# Patient Record
Sex: Male | Born: 1950 | Race: Black or African American | Hispanic: No | Marital: Married | Smoking: Never smoker
Health system: Southern US, Community
[De-identification: ages and names within clinical notes are randomized; demographics above are authoritative.]

## PROBLEM LIST (undated history)

## (undated) DIAGNOSIS — I483 Typical atrial flutter: Secondary | ICD-10-CM

## (undated) DIAGNOSIS — I509 Heart failure, unspecified: Secondary | ICD-10-CM

## (undated) DIAGNOSIS — N200 Calculus of kidney: Secondary | ICD-10-CM

## (undated) DIAGNOSIS — G4730 Sleep apnea, unspecified: Secondary | ICD-10-CM

## (undated) DIAGNOSIS — M1710 Unilateral primary osteoarthritis, unspecified knee: Secondary | ICD-10-CM

## (undated) DIAGNOSIS — K219 Gastro-esophageal reflux disease without esophagitis: Secondary | ICD-10-CM

## (undated) DIAGNOSIS — J40 Bronchitis, not specified as acute or chronic: Secondary | ICD-10-CM

## (undated) DIAGNOSIS — I119 Hypertensive heart disease without heart failure: Secondary | ICD-10-CM

## (undated) HISTORY — DX: Unilateral primary osteoarthritis, unspecified knee: M17.10

## (undated) HISTORY — DX: Hypertensive heart disease without heart failure: I11.9

## (undated) HISTORY — DX: Typical atrial flutter: I48.3

## (undated) HISTORY — PX: OTHER SURGICAL HISTORY: SHX169

## (undated) HISTORY — DX: Heart failure, unspecified: I50.9

---

## 1998-02-05 ENCOUNTER — Emergency Department (HOSPITAL_COMMUNITY): Admission: EM | Admit: 1998-02-05 | Discharge: 1998-02-05 | Payer: Self-pay | Admitting: Emergency Medicine

## 1998-02-11 ENCOUNTER — Ambulatory Visit (HOSPITAL_COMMUNITY): Admission: RE | Admit: 1998-02-11 | Discharge: 1998-02-11 | Payer: Self-pay | Admitting: Urology

## 1998-02-11 ENCOUNTER — Encounter: Payer: Self-pay | Admitting: Urology

## 2000-03-10 ENCOUNTER — Encounter: Admission: RE | Admit: 2000-03-10 | Discharge: 2000-03-10 | Payer: Self-pay | Admitting: Internal Medicine

## 2000-03-10 ENCOUNTER — Encounter: Payer: Self-pay | Admitting: Internal Medicine

## 2005-07-06 ENCOUNTER — Encounter: Payer: Self-pay | Admitting: Emergency Medicine

## 2012-02-13 ENCOUNTER — Other Ambulatory Visit (HOSPITAL_COMMUNITY): Payer: Self-pay | Admitting: Internal Medicine

## 2012-02-13 DIAGNOSIS — R062 Wheezing: Secondary | ICD-10-CM

## 2012-02-17 ENCOUNTER — Ambulatory Visit (HOSPITAL_COMMUNITY)
Admission: RE | Admit: 2012-02-17 | Discharge: 2012-02-17 | Disposition: A | Discharge status: Home / Self Care | Payer: BC Managed Care – PPO | Source: Ambulatory Visit | Attending: Internal Medicine | Admitting: Internal Medicine

## 2012-02-17 DIAGNOSIS — R062 Wheezing: Secondary | ICD-10-CM | POA: Insufficient documentation

## 2012-02-17 MED ORDER — ALBUTEROL SULFATE (5 MG/ML) 0.5% IN NEBU
2.5000 mg | INHALATION_SOLUTION | Freq: Once | RESPIRATORY_TRACT | Status: AC
  Administered 2012-02-17: 2.5 mg via RESPIRATORY_TRACT

## 2012-04-14 ENCOUNTER — Emergency Department (HOSPITAL_COMMUNITY)
Admission: EM | Admit: 2012-04-14 | Discharge: 2012-04-14 | Disposition: A | Discharge status: Home / Self Care | Payer: BC Managed Care – PPO | Attending: Emergency Medicine | Admitting: Emergency Medicine

## 2012-04-14 ENCOUNTER — Emergency Department (HOSPITAL_COMMUNITY): Payer: BC Managed Care – PPO

## 2012-04-14 DIAGNOSIS — I498 Other specified cardiac arrhythmias: Secondary | ICD-10-CM | POA: Insufficient documentation

## 2012-04-14 DIAGNOSIS — J40 Bronchitis, not specified as acute or chronic: Secondary | ICD-10-CM

## 2012-04-14 MED ORDER — ALBUTEROL SULFATE (5 MG/ML) 0.5% IN NEBU
5.0000 mg | INHALATION_SOLUTION | Freq: Once | RESPIRATORY_TRACT | Status: AC
  Administered 2012-04-14: 5 mg via RESPIRATORY_TRACT
  Filled 2012-04-14: qty 1

## 2012-04-14 MED ORDER — PREDNISONE 20 MG PO TABS: 60.0000 mg | ORAL_TABLET | Freq: Every day | ORAL | Status: DC

## 2012-04-14 MED ORDER — PREDNISONE 20 MG PO TABS
60.0000 mg | ORAL_TABLET | Freq: Once | ORAL | Status: AC
  Administered 2012-04-14: 60 mg via ORAL
  Filled 2012-04-14: qty 3

## 2012-04-14 MED ORDER — ALBUTEROL SULFATE HFA 108 (90 BASE) MCG/ACT IN AERS
1.0000 | INHALATION_SPRAY | Freq: Four times a day (QID) | RESPIRATORY_TRACT | Status: AC | PRN
Start: 2012-04-14 — End: ?

## 2012-04-14 NOTE — ED Notes (Signed)
Pt needs breathing treatment prior to discharge, respiratory has been notified of need for treatment. Dr. Dierdre Highman wants pt ambulated also to be sure he is not Sx w/ his brady heart rate.

## 2012-04-14 NOTE — ED Notes (Signed)
Discharge instructions reviewed w/ pt., verbalizes understanding. Two prescriptions provided at discharge. 

## 2012-04-14 NOTE — ED Provider Notes (Signed)
History     CSN: [id number]  Arrival date & time 04/14/12  0350   First MD Initiated Contact with Patient 04/14/12 [phone]      Chief Complaint  Patient presents with  . Wheezing    (Consider location/radiation/quality/duration/timing/severity/associated sxs/prior treatment) HPI Hx per PT. Wheezing unchnged despite z pack and inhaler at home, keeps him up at night, his wife has bronchitis at home. He denies any CP or SOB. No N/V. No rash or recent travel.   Seems to be worse at night time. Nonsmoker no h/o asthma/ COPD, mild to MOD in severity No past medical history on file.  No past surgical history on file.  No family history on file.  History  Substance Use Topics  . Smoking status: Not on file  . Smokeless tobacco: Not on file  . Alcohol Use: Not on file      Review of Systems  Constitutional: Negative for fever and chills.  HENT: Negative for neck pain and neck stiffness.   Eyes: Negative for pain.  Respiratory: Positive for wheezing. Negative for shortness of breath.   Cardiovascular: Negative for chest pain.  Gastrointestinal: Negative for vomiting and abdominal pain.  Genitourinary: Negative for dysuria.  Musculoskeletal: Negative for back pain.  Skin: Negative for rash.  Neurological: Negative for headaches.  All other systems reviewed and are negative.    Allergies  Review of patient's allergies indicates not on file.  Home Medications  No current outpatient prescriptions on file.  There were no vitals taken for this visit.  Physical Exam  Constitutional: He is oriented to person, place, and time. He appears well-developed and well-nourished.  HENT:  Head: Normocephalic and atraumatic.  Eyes: EOM are normal. Pupils are equal, round, and reactive to light.  Neck: Normal range of motion. Neck supple.  Cardiovascular: Regular rhythm and intact distal pulses.   Pulmonary/Chest: Effort normal. No respiratory distress.  Mild bilateral exp wheezes   Abdominal: Soft. Bowel sounds are normal. He exhibits no distension. There is no tenderness.  Musculoskeletal: Normal range of motion. He exhibits no edema.  Neurological: He is alert and oriented to person, place, and time.  Skin: Skin is warm and dry.    ED Course  Procedures (including critical care time)  CXR reviewed - c/w bronchitis  Prednisone and albuterol provided  7:33 AM recheck still sl wheezing, no SOB. Repeat albuterol, plan RX for same and f/u PCP  MDM   Wheezing - wife has viral bronchitis and given symptoms despite Z pack this is patients likley diagnosis as well.    Improved with steroids/ albuterol. CXR reviewed.   VS and nursing notes reviewed   Date: 04/14/2012  Rate: 49  Rhythm: sinus bradycardia  QRS Axis: normal  Intervals: normal  ST/T Wave abnormalities: nonspecific ST/T changes  Conduction Disutrbances:none  Narrative Interpretation:   Old EKG Reviewed: none available   Asymptomatic sinus brady, took BP pill this am, previous ECG HR 62 - PT relays he has slow HR.        Sunnie Nielsen, MD 04/14/12 [phone]

## 2012-04-14 NOTE — ED Notes (Signed)
Opitz, MD at bedside. 

## 2013-12-05 ENCOUNTER — Encounter: Payer: Self-pay | Admitting: Cardiology

## 2014-07-01 ENCOUNTER — Emergency Department (HOSPITAL_COMMUNITY): Payer: BLUE CROSS/BLUE SHIELD

## 2014-07-01 ENCOUNTER — Emergency Department (HOSPITAL_COMMUNITY)
Admission: EM | Admit: 2014-07-01 | Discharge: 2014-07-01 | Disposition: A | Discharge status: Home / Self Care | Payer: BLUE CROSS/BLUE SHIELD | Attending: Emergency Medicine | Admitting: Emergency Medicine

## 2014-07-01 ENCOUNTER — Encounter (HOSPITAL_COMMUNITY): Payer: Self-pay | Admitting: Emergency Medicine

## 2014-07-01 DIAGNOSIS — Z8679 Personal history of other diseases of the circulatory system: Secondary | ICD-10-CM | POA: Insufficient documentation

## 2014-07-01 DIAGNOSIS — R109 Unspecified abdominal pain: Secondary | ICD-10-CM

## 2014-07-01 DIAGNOSIS — N200 Calculus of kidney: Secondary | ICD-10-CM | POA: Insufficient documentation

## 2014-07-01 DIAGNOSIS — R001 Bradycardia, unspecified: Secondary | ICD-10-CM | POA: Insufficient documentation

## 2014-07-01 DIAGNOSIS — Z79899 Other long term (current) drug therapy: Secondary | ICD-10-CM | POA: Diagnosis not present

## 2014-07-01 HISTORY — DX: Calculus of kidney: N20.0

## 2014-07-01 LAB — BASIC METABOLIC PANEL
ANION GAP: 6 (ref 5–15)
BUN: 22 mg/dL (ref 6–23)
CHLORIDE: 104 mmol/L (ref 96–112)
CO2: 25 mmol/L (ref 19–32)
Calcium: 8.9 mg/dL (ref 8.4–10.5)
Creatinine, Ser: 1.58 mg/dL — ABNORMAL HIGH (ref 0.50–1.35)
GFR calc Af Amer: 51 mL/min — ABNORMAL LOW (ref >90)
GFR, EST NON AFRICAN AMERICAN: 44 mL/min — AB (ref >90)
Glucose, Bld: 136 mg/dL — ABNORMAL HIGH (ref 70–99)
Potassium: 4.2 mmol/L (ref 3.5–5.1)
Sodium: 135 mmol/L (ref 135–145)

## 2014-07-01 LAB — URINALYSIS, ROUTINE W REFLEX MICROSCOPIC
Bilirubin Urine: NEGATIVE
GLUCOSE, UA: NEGATIVE mg/dL
KETONES UR: NEGATIVE mg/dL
LEUKOCYTES UA: NEGATIVE
NITRITE: NEGATIVE
Protein, ur: NEGATIVE mg/dL
SPECIFIC GRAVITY, URINE: 1.017 (ref 1.005–1.030)
Urobilinogen, UA: 1 mg/dL (ref 0.0–1.0)
pH: 6 (ref 5.0–8.0)

## 2014-07-01 LAB — CBC
HCT: 36.3 % — ABNORMAL LOW (ref 39.0–52.0)
HEMOGLOBIN: 11.6 g/dL — AB (ref 13.0–17.0)
MCH: 28.2 pg (ref 26.0–34.0)
MCHC: 32 g/dL (ref 30.0–36.0)
MCV: 88.1 fL (ref 78.0–100.0)
PLATELETS: 220 10*3/uL (ref 150–400)
RBC: 4.12 MIL/uL — AB (ref 4.22–5.81)
RDW: 13.4 % (ref 11.5–15.5)
WBC: 4.1 10*3/uL (ref 4.0–10.5)

## 2014-07-01 LAB — URINE MICROSCOPIC-ADD ON

## 2014-07-01 MED ORDER — ONDANSETRON HCL 4 MG/2ML IJ SOLN
4.0000 mg | Freq: Once | INTRAMUSCULAR | Status: AC
  Administered 2014-07-01: 4 mg via INTRAVENOUS
  Filled 2014-07-01: qty 2

## 2014-07-01 MED ORDER — MORPHINE SULFATE 4 MG/ML IJ SOLN
4.0000 mg | Freq: Once | INTRAMUSCULAR | Status: AC
  Administered 2014-07-01: 4 mg via INTRAVENOUS
  Filled 2014-07-01: qty 1

## 2014-07-01 MED ORDER — SODIUM CHLORIDE 0.9 % IV BOLUS (SEPSIS)
1000.0000 mL | Freq: Once | INTRAVENOUS | Status: AC
  Administered 2014-07-01: 1000 mL via INTRAVENOUS

## 2014-07-01 MED ORDER — OXYCODONE-ACETAMINOPHEN 5-325 MG PO TABS: 1.0000 | ORAL_TABLET | ORAL | Status: DC | PRN

## 2014-07-01 MED ORDER — ONDANSETRON 4 MG PO TBDP: 4.0000 mg | ORAL_TABLET | Freq: Three times a day (TID) | ORAL | Status: DC | PRN

## 2014-07-01 NOTE — Discharge Instructions (Signed)
Please follow up with your primary care physician in 1-2 days. If you do not have one please call the [facility] number listed above. Please follow up with Dr. Marlou PorchHerrick to schedule a follow up appointment.  Please take pain medication and/or muscle relaxants as prescribed and as needed for pain. Please do not drive on narcotic pain medication or on muscle relaxants. Please read all discharge instructions and return precautions.   Kidney Stones Kidney stones (urolithiasis) are deposits that form inside your kidneys. The intense pain is caused by the stone moving through the urinary tract. When the stone moves, the ureter goes into spasm around the stone. The stone is usually passed in the urine.  CAUSES   A disorder that makes certain neck glands produce too much parathyroid hormone (primary hyperparathyroidism).  A buildup of uric acid crystals, similar to gout in your joints.  Narrowing (stricture) of the ureter.  A kidney obstruction present at birth (congenital obstruction).  Previous surgery on the kidney or ureters.  Numerous kidney infections. SYMPTOMS   Feeling sick to your stomach (nauseous).  Throwing up (vomiting).  Blood in the urine (hematuria).  Pain that usually spreads (radiates) to the groin.  Frequency or urgency of urination. DIAGNOSIS   Taking a history and physical exam.  Blood or urine tests.  CT scan.  Occasionally, an examination of the inside of the urinary bladder (cystoscopy) is performed. TREATMENT   Observation.  Increasing your fluid intake.  Extracorporeal shock wave lithotripsy--This is a noninvasive procedure that uses shock waves to break up kidney stones.  Surgery may be needed if you have severe pain or persistent obstruction. There are various surgical procedures. Most of the procedures are performed with the use of small instruments. Only small incisions are needed to accommodate these instruments, so recovery time  is minimized. The size, location, and chemical composition are all important variables that will determine the proper choice of action for you. Talk to your health care provider to better understand your situation so that you will minimize the risk of injury to yourself and your kidney.  HOME CARE INSTRUCTIONS   Drink enough water and fluids to keep your urine clear or pale yellow. This will help you to pass the stone or stone fragments.  Strain all urine through the provided strainer. Keep all particulate matter and stones for your health care provider to see. The stone causing the pain may be as small as a grain of salt. It is very important to use the strainer each and every time you pass your urine. The collection of your stone will allow your health care provider to analyze it and verify that a stone has actually passed. The stone analysis will often identify what you can do to reduce the incidence of recurrences.  Only take over-the-counter or prescription medicines for pain, discomfort, or fever as directed by your health care provider.  Make a follow-up appointment with your health care provider as directed.  Get follow-up X-rays if required. The absence of pain does not always mean that the stone has passed. It may have only stopped moving. If the urine remains completely obstructed, it can cause loss of kidney function or even complete destruction of the kidney. It is your responsibility to make sure X-rays and follow-ups are completed. Ultrasounds of the kidney can show blockages and the status of the kidney. Ultrasounds are not associated with any radiation and can be performed easily in a matter of minutes. SEEK  MEDICAL CARE IF:  You experience pain that is progressive and unresponsive to any pain medicine you have been prescribed. SEEK IMMEDIATE MEDICAL CARE IF:   Pain cannot be controlled with the prescribed medicine.  You have a fever or shaking chills.  The severity or  intensity of pain increases over 18 hours and is not relieved by pain medicine.  You develop a new onset of abdominal pain.  You feel faint or pass out.  You are unable to urinate. MAKE SURE YOU:   Understand these instructions.  Will watch your condition.  Will get help right away if you are not doing well or get worse. Document Released: 02/21/2005 Document Revised: 10/24/2012 Document Reviewed: 07/25/2012 [facility] Patient Information 2015 [place], [place]. This information is not intended to replace advice given to you by your health care provider. Make sure you discuss any questions you have with your health care provider.

## 2014-07-01 NOTE — ED Notes (Signed)
Pt states he is having a kidney stone attack   Pt states pain started about midnight  Pt states the pain is on the right side  Pt denies nausea or vomiting at this time

## 2014-07-01 NOTE — ED Provider Notes (Signed)
CSN: [id number]     Arrival date & time 07/01/14  4098 History   First MD Initiated Contact with Patient 07/01/14 [phone]     Chief Complaint  Patient presents with  . Flank Pain     (Consider location/radiation/quality/duration/timing/severity/associated sxs/prior Treatment) HPI Comments: Patient is a 64 yo M PMHx significant for kidney stones and irregular heartbeat presenting to the ED for acute onset right sided flank pain with radiation to his side. He has noted some improvement after taking a muscle relaxant. Patient believes he passed a kidney stone the other day. Endorses this pain feels like previous kidney stones. Denies any nausea, vomiting, diarrhea, constipation, urinary symptoms. No abdominal surgical history.   Patient is a 64 y.o. male presenting with flank pain.  Flank Pain    Past Medical History  Diagnosis Date  . Kidney stones   . Irregular heartbeat    History reviewed. No pertinent past surgical history. History reviewed. No pertinent family history. History  Substance Use Topics  . Smoking status: Never Smoker   . Smokeless tobacco: Not on file  . Alcohol Use: No    Review of Systems  Genitourinary: Positive for flank pain.  All other systems reviewed and are negative.     Allergies  Aspirin  Home Medications   Prior to Admission medications   Medication Sig Start Date End Date Taking? Authorizing Provider  albuterol (PROVENTIL HFA;VENTOLIN HFA) 108 (90 BASE) MCG/ACT inhaler Inhale 1-2 puffs into the lungs every 6 (six) hours as needed for wheezing. 04/14/12  Yes Sunnie Nielsen, MD  losartan (COZAAR) 100 MG tablet Take 100 mg by mouth daily.   Yes Historical Provider, MD  tiZANidine (ZANAFLEX) 2 MG tablet Take 2 mg by mouth every 8 (eight) hours as needed for muscle spasms.   Yes Historical Provider, MD  albuterol (PROVENTIL HFA;VENTOLIN HFA) 108 (90 BASE) MCG/ACT inhaler Inhale 2 puffs into the lungs every 4 (four) hours as needed for wheezing (for  wheezing).    Historical Provider, MD  ondansetron (ZOFRAN ODT) 4 MG disintegrating tablet Take 1 tablet (4 mg total) by mouth every 8 (eight) hours as needed for nausea. 07/01/14   Kathlee Barnhardt, PA-C  oxyCODONE-acetaminophen (PERCOCET) 5-325 MG per tablet Take 1-2 tablets by mouth every 4 (four) hours as needed. 07/01/14   Navpreet Szczygiel, PA-C  predniSONE (DELTASONE) 20 MG tablet Take 3 tablets (60 mg total) by mouth daily. Patient not taking: Reported on 07/01/2014 04/14/12   Sunnie Nielsen, MD   BP 149/72 mmHg  Pulse 52  Temp(Src) 98.5 F (36.9 C) (Oral)  Resp 20  Ht  (1.88 m)  Wt 250 lb (113.399 kg)  BMI 32.08 kg/m2  SpO2 97% Physical Exam  Constitutional: He is oriented to person, place, and time. He appears well-developed and well-nourished. No distress.  HENT:  Head: Normocephalic and atraumatic.  Right Ear: External ear normal.  Left Ear: External ear normal.  Nose: Nose normal.  Mouth/Throat: Oropharynx is clear and moist.  Eyes: Conjunctivae are normal.  Neck: Normal range of motion. Neck supple.  No nuchal rigidity.   Cardiovascular: Normal heart sounds.  A regularly irregular rhythm present. Bradycardia present.   Pulmonary/Chest: Effort normal and breath sounds normal. No respiratory distress.  Abdominal: Soft. There is no tenderness. There is no rigidity, no rebound, no guarding and no CVA tenderness.  Musculoskeletal: Normal range of motion.       Thoracic back: He exhibits no bony tenderness and no deformity.  Lumbar back: He exhibits no bony tenderness and no deformity.       Back:  Neurological: He is alert and oriented to person, place, and time.  Skin: Skin is warm and dry. He is not diaphoretic.  Psychiatric: He has a normal mood and affect.  Nursing note and vitals reviewed.   ED Course  Procedures (including critical care time) Medications  morphine 4 MG/ML injection 4 mg (4 mg Intravenous Given 07/01/14 0658)  ondansetron (ZOFRAN)  injection 4 mg (4 mg Intravenous Given 07/01/14 0658)  sodium chloride 0.9 % bolus 1,000 mL (1,000 mLs Intravenous New Bag/Given 07/01/14 0859)  morphine 4 MG/ML injection 4 mg (4 mg Intravenous Given 07/01/14 0941)    Labs Review Labs Reviewed  URINALYSIS, ROUTINE W REFLEX MICROSCOPIC - Abnormal; Notable for the following:    APPearance CLOUDY (*)    Hgb urine dipstick SMALL (*)    All other components within normal limits  BASIC METABOLIC PANEL - Abnormal; Notable for the following:    Glucose, Bld 136 (*)    Creatinine, Ser 1.58 (*)    GFR calc non Af Amer 44 (*)    GFR calc Af Amer 51 (*)    All other components within normal limits  CBC - Abnormal; Notable for the following:    RBC 4.12 (*)    Hemoglobin 11.6 (*)    HCT 36.3 (*)    All other components within normal limits  URINE CULTURE  URINE MICROSCOPIC-ADD ON    Imaging Review Ct Renal Stone Study  07/01/2014   CLINICAL DATA:  Right side pain beginning at midnight.  EXAM: CT ABDOMEN AND PELVIS WITHOUT CONTRAST  TECHNIQUE: Multidetector CT imaging of the abdomen and pelvis was performed following the standard protocol without IV contrast.  COMPARISON:  05/01/2007  FINDINGS: Lower chest:  Minimal dependent atelectasis in the lung bases.  Hepatobiliary: No biliary ductal dilatation or focal hepatic lesion. Gallbladder grossly unremarkable.  Pancreas: No focal abnormality or ductal dilatation.  Spleen: No focal abnormality.  Normal size.  Adrenals/Urinary Tract: Adrenal glands unremarkable. Punctate nonobstructing stones in the left Kidney. At least 2 stones in the right kidney with the largest in the midpole measuring 7 mm. Mild right hydronephrosis due to a punctate 2-3 mm stone in the proximal right ureter. Urinary bladder is unremarkable.  Stomach/Bowel: Stomach, large and small bowel grossly unremarkable. Appendix is visualized and unremarkable.  Vascular/Lymphatic: No retroperitoneal or mesenteric adenopathy. Aorta normal caliber.   Reproductive: No mass or other significant abnormality.  Other: No free fluid or free air.  Musculoskeletal: No focal bone lesion or acute bony abnormality.  IMPRESSION: 2-3 mm proximal right ureteral stone with mild right hydronephrosis.  Bilateral nephrolithiasis.   Electronically Signed   By: Charlett Nose M.D.   On: 07/01/2014 09:37     EKG Interpretation None      10:24 AM Manual HR taken for 60 seconds, pulse rate of 59 bmp with regular irregular rhythm appreciated, history of bigeminy. Patient denies any CP, palpitations, SOB, lightheadedness.   MDM   Final diagnoses:  Right flank pain  Renal stone    Filed Vitals:   07/01/14 1008  BP: 149/72  Pulse: 52  Temp:   Resp: 20   Afebrile, NAD, non-toxic appearing, AAOx4.  I have reviewed nursing notes, vital signs, and all appropriate lab and imaging results for this patient.  Pt has been diagnosed with a Kidney Stone via CT. There is no evidence of significant hydronephrosis, serum  creatine WNL, vitals sign stable and the pt does not have irratractable vomiting. Pt will be dc home with pain medications & has been advised to follow up with PCP. Patient is stable at time of discharge      Francee PiccoloJennifer Jaliza Seifried, PA-C 07/01/14 1558  Mirian MoMatthew Gentry, MD 07/05/14 0005

## 2014-07-02 LAB — URINE CULTURE
COLONY COUNT: NO GROWTH
CULTURE: NO GROWTH

## 2014-07-08 ENCOUNTER — Other Ambulatory Visit: Payer: Self-pay | Admitting: Internal Medicine

## 2014-07-08 DIAGNOSIS — N183 Chronic kidney disease, stage 3 unspecified: Secondary | ICD-10-CM

## 2014-07-09 ENCOUNTER — Other Ambulatory Visit: Payer: BLUE CROSS/BLUE SHIELD

## 2014-07-14 ENCOUNTER — Other Ambulatory Visit: Payer: BLUE CROSS/BLUE SHIELD

## 2014-07-18 ENCOUNTER — Ambulatory Visit
Admission: RE | Admit: 2014-07-18 | Discharge: 2014-07-18 | Disposition: A | Discharge status: Home / Self Care | Payer: BLUE CROSS/BLUE SHIELD | Source: Ambulatory Visit | Attending: Internal Medicine | Admitting: Internal Medicine

## 2014-07-18 DIAGNOSIS — N183 Chronic kidney disease, stage 3 unspecified: Secondary | ICD-10-CM

## 2014-07-30 ENCOUNTER — Other Ambulatory Visit: Payer: Self-pay | Admitting: Urology

## 2014-07-30 NOTE — Progress Notes (Signed)
Called for orders surgery 08-01-14 pre op 07-31-14 Thanks

## 2014-07-31 ENCOUNTER — Encounter (HOSPITAL_COMMUNITY): Payer: Self-pay

## 2014-07-31 ENCOUNTER — Encounter (HOSPITAL_COMMUNITY)
Admission: RE | Admit: 2014-07-31 | Discharge: 2014-07-31 | Disposition: A | Discharge status: Home / Self Care | Payer: BLUE CROSS/BLUE SHIELD | Source: Ambulatory Visit | Attending: Urology | Admitting: Urology

## 2014-07-31 DIAGNOSIS — N132 Hydronephrosis with renal and ureteral calculous obstruction: Secondary | ICD-10-CM | POA: Diagnosis present

## 2014-07-31 DIAGNOSIS — Z6831 Body mass index (BMI) 31.0-31.9, adult: Secondary | ICD-10-CM | POA: Diagnosis not present

## 2014-07-31 DIAGNOSIS — E669 Obesity, unspecified: Secondary | ICD-10-CM | POA: Diagnosis not present

## 2014-07-31 DIAGNOSIS — F329 Major depressive disorder, single episode, unspecified: Secondary | ICD-10-CM | POA: Diagnosis not present

## 2014-07-31 DIAGNOSIS — N179 Acute kidney failure, unspecified: Secondary | ICD-10-CM | POA: Diagnosis not present

## 2014-07-31 DIAGNOSIS — N189 Chronic kidney disease, unspecified: Secondary | ICD-10-CM | POA: Diagnosis not present

## 2014-07-31 DIAGNOSIS — K219 Gastro-esophageal reflux disease without esophagitis: Secondary | ICD-10-CM | POA: Diagnosis not present

## 2014-07-31 DIAGNOSIS — Z886 Allergy status to analgesic agent status: Secondary | ICD-10-CM | POA: Diagnosis not present

## 2014-07-31 DIAGNOSIS — G4733 Obstructive sleep apnea (adult) (pediatric): Secondary | ICD-10-CM | POA: Diagnosis not present

## 2014-07-31 HISTORY — DX: Sleep apnea, unspecified: G47.30

## 2014-07-31 HISTORY — DX: Gastro-esophageal reflux disease without esophagitis: K21.9

## 2014-07-31 HISTORY — DX: Bronchitis, not specified as acute or chronic: J40

## 2014-07-31 LAB — CBC
HEMATOCRIT: 36.6 % — AB (ref 39.0–52.0)
HEMOGLOBIN: 12 g/dL — AB (ref 13.0–17.0)
MCH: 28.5 pg (ref 26.0–34.0)
MCHC: 32.8 g/dL (ref 30.0–36.0)
MCV: 86.9 fL (ref 78.0–100.0)
Platelets: 293 10*3/uL (ref 150–400)
RBC: 4.21 MIL/uL — AB (ref 4.22–5.81)
RDW: 13.8 % (ref 11.5–15.5)
WBC: 4 10*3/uL (ref 4.0–10.5)

## 2014-07-31 LAB — BASIC METABOLIC PANEL
ANION GAP: 10 (ref 5–15)
BUN: 47 mg/dL — AB (ref 6–20)
CALCIUM: 9.6 mg/dL (ref 8.9–10.3)
CO2: 25 mmol/L (ref 22–32)
Chloride: 103 mmol/L (ref 101–111)
Creatinine, Ser: 3.06 mg/dL — ABNORMAL HIGH (ref 0.61–1.24)
GFR calc Af Amer: 23 mL/min — ABNORMAL LOW (ref >60)
GFR calc non Af Amer: 20 mL/min — ABNORMAL LOW (ref >60)
Glucose, Bld: 122 mg/dL — ABNORMAL HIGH (ref 65–99)
POTASSIUM: 4.3 mmol/L (ref 3.5–5.1)
SODIUM: 138 mmol/L (ref 135–145)

## 2014-07-31 NOTE — Progress Notes (Signed)
BMP results in epic per PAT visit 07/31/2014 sent to Dr Mena GoesEskridge

## 2014-07-31 NOTE — Patient Instructions (Addendum)
20 Darrell Perkins  07/31/2014   Your procedure is scheduled on:      Friday Aug 01, 2014   Report to [facility] Main Entrance and follow signs to  Short Stay Center arrive at 11:00 AM.  Call this number if you have problems the morning of surgery [phone] or Presurgical Testing [phone].   Remember:  Do not eat food After Midnight but may take clear liquids till 7:00 am day of surgery then nothing by mouth.       Take these medicines the morning of surgery: May use Albuterol Inhaler if needed                               You may not have any metal on your body including hair pins and piercings  Do not wear jewelry, colognes, lotions, powders, or deodorant.  Men may shave face and neck.             ONLY ONE PERSON WITH YOU IN SHORT STAY MORNING OF SURGERY. IF TIME PERMITS AFTER YOU ARE READY, OTHERS MAY VISIT. NO MORE THAT 2 PERSONS IN ROOM AT A TIME.                Do not bring valuables to the hospital. [facility] IS NOT RESPONSIBLE FOR VALUABLES.  Contacts, dentures or bridgework may not be worn into surgery.  Leave suitcase in the car. After surgery it may be brought to your room.  For patients admitted to the hospital, checkout time is 11:00 AM the day of discharge.   Patients discharged the day of surgery will not be allowed to drive home.  Name and phone number of your driver:Darrell Perkins (wife)   ________________________________________________________________________  [facility] - Preparing for Surgery Before surgery, you can play an important role.  Because skin is not sterile, your skin needs to be as free of germs as possible.  You can reduce the number of germs on your skin by washing with CHG (chlorahexidine gluconate) soap before surgery.  CHG is an antiseptic cleaner which kills germs and bonds with the skin to continue killing germs even after washing. Please DO NOT use if you have an allergy to CHG or antibacterial soaps.  If your skin becomes  reddened/irritated stop using the CHG and inform your nurse when you arrive at Short Stay. Do not shave (including legs and underarms) for at least 48 hours prior to the first CHG shower.  You may shave your face/neck. Please follow these instructions carefully:  1.  Shower with CHG Soap the night before surgery and the  morning of Surgery.  2.  If you choose to wash your hair, wash your hair first as usual with your  normal  shampoo.  3.  After you shampoo, rinse your hair and body thoroughly to remove the  shampoo.                           4.  Use CHG as you would any other liquid soap.  You can apply chg directly  to the skin and wash                       Gently with a scrungie or clean washcloth.  5.  Apply the CHG Soap to your body ONLY FROM THE NECK DOWN.   Do not use on  face/ open                           Wound or open sores. Avoid contact with eyes, ears mouth and genitals (private parts).                       Wash face,  Genitals (private parts) with your normal soap.             6.  Wash thoroughly, paying special attention to the area where your surgery  will be performed.  7.  Thoroughly rinse your body with warm water from the neck down.  8.  DO NOT shower/wash with your normal soap after using and rinsing off  the CHG Soap.                9.  Pat yourself dry with a clean towel.            10.  Wear clean pajamas.            11.  Place clean sheets on your bed the night of your first shower and do not  sleep with pets. Day of Surgery : Do not apply any lotions/deodorants the morning of surgery.  Please wear clean clothes to the hospital/surgery center.  FAILURE TO FOLLOW THESE INSTRUCTIONS MAY RESULT IN THE CANCELLATION OF YOUR SURGERY PATIENT SIGNATURE_________________________________  NURSE SIGNATURE__________________________________  ________________________________________________________________________    CLEAR LIQUID DIET   Foods Allowed                                                                      Foods Excluded  Coffee and tea, regular and decaf                             liquids that you cannot  Plain Jell-O in any flavor                                             see through such as: Fruit ices (not with fruit pulp)                                     milk, soups, orange juice  Iced Popsicles                                    All solid food Carbonated beverages, regular and diet                                    Cranberry, grape and apple juices Sports drinks like Gatorade Lightly seasoned clear broth or consume(fat free) Sugar, honey syrup  Sample Menu Breakfast  Lunch                                     Supper Cranberry juice                    Beef broth                            Chicken broth Jell-O                                     Grape juice                           Apple juice Coffee or tea                        Jell-O                                      Popsicle                                                Coffee or tea                        Coffee or tea  _____________________________________________________________________

## 2014-07-31 NOTE — Progress Notes (Signed)
No orders in at time of PAT visit 07/31/2014. Orders need to be placed in epic. Thanks.

## 2014-07-31 NOTE — H&P (Signed)
History of Present Illness   He presents today for evaluation of continued rising creatinine r/t possible right renal obstruction. He endorses no pain or change in his voiding symptoms. He denies gross hematuria. He states feeling fine. He does present with a lithiasis that he stated passing 2 days ago.     Past Medical History Problems  1. History of CPAP (continuous positive airway pressure) dependence (Z99.89) 2. History of depression (Z86.59) 3. History of esophageal reflux (Z87.19) 4. History of renal calculi (Z87.442) 5. History of Obstructive sleep apnea, adult (G47.33)  Surgical History Problems  1. History of No Surgical Problems  Current Meds 1. Advil TABS;  Therapy: (Recorded:24Feb2009) to Recorded 2. Albuterol 90 MCG/ACT AERS;  Therapy: (Recorded:18May2016) to Recorded 3. AndroGel 20.25 MG/1.25GM (1.62%) Transdermal Gel;  Therapy: (Recorded:18May2016) to Recorded 4. Bee Pollen CAPS;  Therapy: (Recorded:18May2016) to Recorded 5. Claritin 10 MG Oral Tablet;  Therapy: (Recorded:18May2016) to Recorded 6. Losartan Potassium 100 MG Oral Tablet;  Therapy: (Recorded:18May2016) to Recorded 7. Nasonex SUSP;  Therapy: (Recorded:18May2016) to Recorded 8. Tamsulosin HCl - 0.4 MG Oral Capsule; TAKE 1 CAPSULE Daily;  Therapy: [id number] to (Evaluate:17Jul2016)  Requested for: [id number]; Last  Rx:18May2016 Ordered 9. TiZANidine HCl - 2 MG Oral Tablet;  Therapy: (Recorded:18May2016) to Recorded 10. Tylenol Extra Strength TABS;   Therapy: (Recorded:18May2016) to Recorded  Allergies Medication  1. Aspirin TABS  Family History Problems  1. Family history of Death In The Family Father   died age 65-diabetes and alcohol 2. Family history of Death In The Family Mother   died age 68-renal failure 3. Family history of Family Health Status Number Of Children   1 son and 1 daughter 4. Family history of diabetes mellitus (Z83.3) : Father 5. Family history of kidney stones  (Z84.1) : Mother  Social History Problems  1. Denied: History of Alcohol Use 2. Caffeine use (F15.90)   2  20 ounces a day 3. Marital History - Currently Married 4. Never a smoker 5. Number of children   3 sons    1 daughter 46. Occupation   Works for Consulting civil engineer 7. Denied: History of Tobacco Use  Review of Systems Genitourinary, constitutional, skin, eye, otolaryngeal, hematologic/lymphatic, cardiovascular, pulmonary, endocrine, musculoskeletal, gastrointestinal, neurological and psychiatric system(s) were reviewed and pertinent findings if present are noted and are otherwise negative.    Vitals Vital Signs [Data Includes: Last 1 Day]  Recorded: [id number] 10:34AM  Blood Pressure: 145 / 76 Temperature: 98.2 F Heart Rate: 57  Physical Exam Constitutional: Well nourished and well developed . No acute distress.   ENT:. The ears and nose are normal in appearance.   Neck: The appearance of the neck is normal and no neck mass is present.   Pulmonary: No respiratory distress and normal respiratory rhythm and effort.   Cardiovascular: Heart rate and rhythm are normal . No peripheral edema.   Abdomen: The abdomen is soft and nontender. No masses are palpated. No CVA tenderness. No hernias are palpable. No hepatosplenomegaly noted.   Genitourinary: Examination of the penis demonstrates no discharge, no masses, no lesions and a normal meatus. The scrotum is without lesions. The right epididymis is palpably normal and non-tender. The left epididymis is palpably normal and non-tender. The right testis is non-tender and without masses. The left testis is non-tender and without masses.   Lymphatics: The femoral and inguinal nodes are not enlarged or tender.   Skin: Normal skin turgor, no visible rash and no visible  skin lesions.   Neuro/Psych:. Mood and affect are appropriate.    Results/Data   Rt Kidney: The right kidney is visualized in size measuring 13.26  cm height x 5.48 cm anterior-posterior and 5.98 cm medial-lateral. Cortical-medullary differentiation is visualized with average cortex thickness of 1.72 cm. A questionable hyperechoic area vs normal tissue is identified In the mid-pole area measuring 3.58 cm x 3.34 cm x 2.67 cm with blood flow noted in the surrounding area. Moderate renal pelvic dilatation (hydronehprosis) is visualized. Proximal ureteral dilation is visualized. There are two hyperechoic areas resembling a lithiasis in the UPJ measuring 1.44 cm and 0.84 cm respectively. In addition there are two similar findings in the mid to lower pole measuring 0.65 cm and 0.31 cm; these do not appear to be obstructive in a nature.    Impression: Right hydronephrosis r/t obstructing UPJ lithiasis  The following images/tracing/specimen were independently visualized:  UA positive for hematuria and pyuria. Will send for culture.  The following clinical lab reports were reviewed:  BUN & CREATININE Final  No Documents Attached  SPECIMEN TYPE: BLOOD   Result Annotated [id number] 02:54PM by Vinnie Langton  pt advised of test results and that he need to see the NP ASAP for renal/us. I sent the task to the girls up front to move pt appt up with NP to next available instead of waiting a few weeks. LHS,CMA   Result Annotated [id number] 01:49PM by Festus Aloe  notify patient his PSA is normal. His kidney function has gotten worse. I want him to see the NP ASAP for renal u/s. please have scheduling move up pt appt with NP to next available instead of waiting a few weeks.    Test Result Flag Reference Goal   CREATININE 2.94 mg/dL H 0.50-1.50 New   BUN 32 mg/dL H 6-23 New   Est GFR, African American 25 mL/min L New   Est GFR, NonAfrican American 21 mL/min L New   THE ESTIMATED GFR IS A CALCULATION VALID FOR ADULTS (>=71 YEARS OLD) THAT USES THE CKD-EPI ALGORITHM TO ADJUST FOR AGE AND SEX. IT IS NOT TO BE USED FOR CHILDREN, PREGNANT WOMEN,  HOSPITALIZED PATIENTS, PATIENTS ON DIALYSIS, OR WITH RAPIDLY CHANGING KIDNEY FUNCTION. ACCORDING TO THE NKDEP, EGFR >89 IS NORMAL, 60-89 SHOWS MILD IMPAIRMENT, 30-59 SHOWS MODERATE IMPAIRMENT, 15-29 SHOWS SEVERE IMPAIRMENT AND <15 IS ESRD.     Ordered by: Festus Aloe Collected/Examined: [id number] 03:31PM  Verified by: Festus Aloe [id number] 01:49PM  Result Communication: Call patient with results; Stage: Final  Performed at: Hospital doctor Performed by: Curt Jews: [id number] 10:51PM Last Updated: [id number] 02:54PM Accession: [id number].  Discussed:  With Dr. Junious Silk.    Assessment Assessed  1. Nephrolithiasis (N20.0) 2. Pyuria (N39.0) 3. Hydronephrosis, right (N13.30)  Plan Nephrolithiasis  1. Follow-up Schedule Surgery Office  Follow-up  Status: Hold For - Appointment   Requested for: [id number]  Discussion/Summary   I discussed these findings with Dr. Junious Silk and he agrees that the patient needs a ureteroscopy to explore and possibly relieve the obstruction that is causing his elevated creatinine and hydronephrosis. I will culture his urine and send the stone brought today for analysis. I counseled the patient on immediate follow up should symptoms develop such as fever, uncontrollable pain & n/v, retention or gross hematuria. We will f/u with him after his procedure.     Signatures Electronically signed by : Festus Aloe, M.D.; Jul 31 2014  4:46PM EST  ADDENDUM: I discussed the patient with NP Noberto Retort.  I reviewed the images, I reviewed his labs.

## 2014-08-01 ENCOUNTER — Encounter (HOSPITAL_COMMUNITY): Payer: Self-pay | Admitting: *Deleted

## 2014-08-01 ENCOUNTER — Ambulatory Visit (HOSPITAL_COMMUNITY): Payer: BLUE CROSS/BLUE SHIELD | Admitting: Anesthesiology

## 2014-08-01 ENCOUNTER — Encounter (HOSPITAL_COMMUNITY)
Admission: RE | Disposition: A | Discharge status: Home / Self Care | Payer: Self-pay | Source: Ambulatory Visit | Attending: Urology

## 2014-08-01 ENCOUNTER — Ambulatory Visit (HOSPITAL_COMMUNITY)
Admission: RE | Admit: 2014-08-01 | Discharge: 2014-08-01 | Disposition: A | Discharge status: Home / Self Care | Payer: BLUE CROSS/BLUE SHIELD | Source: Ambulatory Visit | Attending: Urology | Admitting: Urology

## 2014-08-01 DIAGNOSIS — N201 Calculus of ureter: Secondary | ICD-10-CM

## 2014-08-01 DIAGNOSIS — Z886 Allergy status to analgesic agent status: Secondary | ICD-10-CM | POA: Insufficient documentation

## 2014-08-01 DIAGNOSIS — K219 Gastro-esophageal reflux disease without esophagitis: Secondary | ICD-10-CM | POA: Insufficient documentation

## 2014-08-01 DIAGNOSIS — G4733 Obstructive sleep apnea (adult) (pediatric): Secondary | ICD-10-CM | POA: Insufficient documentation

## 2014-08-01 DIAGNOSIS — N132 Hydronephrosis with renal and ureteral calculous obstruction: Secondary | ICD-10-CM | POA: Insufficient documentation

## 2014-08-01 DIAGNOSIS — E669 Obesity, unspecified: Secondary | ICD-10-CM | POA: Insufficient documentation

## 2014-08-01 DIAGNOSIS — F329 Major depressive disorder, single episode, unspecified: Secondary | ICD-10-CM | POA: Insufficient documentation

## 2014-08-01 DIAGNOSIS — Z6831 Body mass index (BMI) 31.0-31.9, adult: Secondary | ICD-10-CM | POA: Insufficient documentation

## 2014-08-01 DIAGNOSIS — N179 Acute kidney failure, unspecified: Secondary | ICD-10-CM | POA: Insufficient documentation

## 2014-08-01 DIAGNOSIS — N189 Chronic kidney disease, unspecified: Secondary | ICD-10-CM | POA: Insufficient documentation

## 2014-08-01 HISTORY — PX: CYSTOSCOPY WITH RETROGRADE PYELOGRAM, URETEROSCOPY AND STENT PLACEMENT: SHX5789

## 2014-08-01 SURGERY — CYSTOURETEROSCOPY, WITH RETROGRADE PYELOGRAM AND STENT INSERTION
Anesthesia: General | Laterality: Right

## 2014-08-01 MED ORDER — FENTANYL CITRATE (PF) 100 MCG/2ML IJ SOLN
INTRAMUSCULAR | Status: AC
  Filled 2014-08-01: qty 2

## 2014-08-01 MED ORDER — LIDOCAINE HCL (CARDIAC) 20 MG/ML IV SOLN
INTRAVENOUS | Status: DC | PRN
  Administered 2014-08-01: 70 mg via INTRAVENOUS

## 2014-08-01 MED ORDER — EPHEDRINE SULFATE 50 MG/ML IJ SOLN
INTRAMUSCULAR | Status: DC | PRN
  Administered 2014-08-01: 5 mg via INTRAVENOUS

## 2014-08-01 MED ORDER — DEXAMETHASONE SODIUM PHOSPHATE 10 MG/ML IJ SOLN
INTRAMUSCULAR | Status: DC | PRN
  Administered 2014-08-01: 5 mg via INTRAVENOUS

## 2014-08-01 MED ORDER — OXYCODONE-ACETAMINOPHEN 5-325 MG PO TABS: 1.0000 | ORAL_TABLET | Freq: Four times a day (QID) | ORAL | Status: DC | PRN

## 2014-08-01 MED ORDER — DEXAMETHASONE SODIUM PHOSPHATE 10 MG/ML IJ SOLN
INTRAMUSCULAR | Status: AC
  Filled 2014-08-01: qty 1

## 2014-08-01 MED ORDER — CEFAZOLIN SODIUM-DEXTROSE 2-3 GM-% IV SOLR
INTRAVENOUS | Status: AC
  Filled 2014-08-01: qty 50

## 2014-08-01 MED ORDER — MIDAZOLAM HCL 5 MG/5ML IJ SOLN
INTRAMUSCULAR | Status: DC | PRN
  Administered 2014-08-01: 2 mg via INTRAVENOUS

## 2014-08-01 MED ORDER — CEFAZOLIN SODIUM-DEXTROSE 2-3 GM-% IV SOLR
2.0000 g | INTRAVENOUS | Status: AC
  Administered 2014-08-01: 2 g via INTRAVENOUS

## 2014-08-01 MED ORDER — MIDAZOLAM HCL 2 MG/2ML IJ SOLN
INTRAMUSCULAR | Status: AC
  Filled 2014-08-01: qty 2

## 2014-08-01 MED ORDER — ONDANSETRON HCL 4 MG/2ML IJ SOLN
INTRAMUSCULAR | Status: DC | PRN
  Administered 2014-08-01: 4 mg via INTRAVENOUS

## 2014-08-01 MED ORDER — PROPOFOL 10 MG/ML IV BOLUS
INTRAVENOUS | Status: AC
  Filled 2014-08-01: qty 20

## 2014-08-01 MED ORDER — LACTATED RINGERS IV SOLN
INTRAVENOUS | Status: DC | PRN
  Administered 2014-08-01: 13:00:00 via INTRAVENOUS

## 2014-08-01 MED ORDER — PROPOFOL 10 MG/ML IV BOLUS
INTRAVENOUS | Status: DC | PRN
  Administered 2014-08-01: 200 mg via INTRAVENOUS

## 2014-08-01 MED ORDER — LIDOCAINE HCL (CARDIAC) 20 MG/ML IV SOLN
INTRAVENOUS | Status: AC
  Filled 2014-08-01: qty 5

## 2014-08-01 MED ORDER — LIDOCAINE HCL 1 % IJ SOLN
INTRAMUSCULAR | Status: DC | PRN
  Administered 2014-08-01: 50 mg via INTRADERMAL

## 2014-08-01 MED ORDER — IOHEXOL 350 MG/ML SOLN
INTRAVENOUS | Status: DC | PRN
  Administered 2014-08-01: 17 mL

## 2014-08-01 MED ORDER — SODIUM CHLORIDE 0.9 % IR SOLN
Status: DC | PRN
  Administered 2014-08-01: 4000 mL

## 2014-08-01 MED ORDER — HYDROMORPHONE HCL 1 MG/ML IJ SOLN: 0.2500 mg | INTRAMUSCULAR | Status: DC | PRN

## 2014-08-01 MED ORDER — FENTANYL CITRATE (PF) 100 MCG/2ML IJ SOLN
INTRAMUSCULAR | Status: DC | PRN
  Administered 2014-08-01: 50 ug via INTRAVENOUS
  Administered 2014-08-01 (×2): 25 ug via INTRAVENOUS

## 2014-08-01 SURGICAL SUPPLY — 32 items
BAG URO CATCHER STRL LF (DRAPE) ×3 IMPLANT
BASKET LASER NITINOL 1.9FR (BASKET) IMPLANT
BASKET STNLS GEMINI 4WIRE 3FR (BASKET) IMPLANT
BASKET ZERO TIP NITINOL 2.4FR (BASKET) ×2 IMPLANT
BRUSH URET BIOPSY 3F (UROLOGICAL SUPPLIES) IMPLANT
BSKT STON RTRVL 120 1.9FR (BASKET)
BSKT STON RTRVL GEM 120X11 3FR (BASKET)
BSKT STON RTRVL ZERO TP 2.4FR (BASKET) ×1
CATH INTERMIT  6FR 70CM (CATHETERS) ×2 IMPLANT
CATH URET 5FR 28IN CONE TIP (BALLOONS) ×2
CATH URET 5FR 70CM CONE TIP (BALLOONS) IMPLANT
CLOTH BEACON ORANGE TIMEOUT ST (SAFETY) ×3 IMPLANT
FIBER LASER FLEXIVA 1000 (UROLOGICAL SUPPLIES) IMPLANT
FIBER LASER FLEXIVA 200 (UROLOGICAL SUPPLIES) ×2 IMPLANT
FIBER LASER FLEXIVA 365 (UROLOGICAL SUPPLIES) IMPLANT
FIBER LASER FLEXIVA 550 (UROLOGICAL SUPPLIES) IMPLANT
FIBER LASER TRAC TIP (UROLOGICAL SUPPLIES) IMPLANT
GLOVE BIOGEL M STRL SZ7.5 (GLOVE) ×3 IMPLANT
GOWN STRL REUS W/TWL XL LVL3 (GOWN DISPOSABLE) ×3 IMPLANT
GUIDEWIRE ANG ZIPWIRE 038X150 (WIRE) ×2 IMPLANT
GUIDEWIRE STR DUAL SENSOR (WIRE) ×2 IMPLANT
IV NS IRRIG 3000ML ARTHROMATIC (IV SOLUTION) ×2 IMPLANT
KIT BALLIN UROMAX 15FX10 (LABEL) IMPLANT
KIT BALLN UROMAX 15FX4 (MISCELLANEOUS) IMPLANT
KIT BALLN UROMAX 26 75X4 (MISCELLANEOUS)
PACK CYSTO (CUSTOM PROCEDURE TRAY) ×4 IMPLANT
SET HIGH PRES BAL DIL (LABEL)
SHEATH ACCESS URETERAL 24CM (SHEATH) IMPLANT
SHEATH ACCESS URETERAL 38CM (SHEATH) ×2 IMPLANT
SHEATH ACCESS URETERAL 54CM (SHEATH) IMPLANT
STENT CONTOUR 6FRX28X.038 (STENTS) ×2 IMPLANT
SYRINGE IRR TOOMEY STRL 70CC (SYRINGE) IMPLANT

## 2014-08-01 NOTE — Anesthesia Preprocedure Evaluation (Addendum)
Anesthesia Evaluation  Patient identified by MRN, date of birth, ID band Patient awake    Reviewed: Allergy & Precautions, NPO status , Patient's Chart, lab work & pertinent test results  Airway Mallampati: II  TM Distance: >3 FB Neck ROM: Full    Dental no notable dental hx.    Pulmonary sleep apnea and Continuous Positive Airway Pressure Ventilation , pneumonia -, resolved,  breath sounds clear to auscultation  Pulmonary exam normal       Cardiovascular Exercise Tolerance: Good negative cardio ROS Normal cardiovascular exam+ dysrhythmias Rhythm:Regular Rate:Normal     Neuro/Psych negative neurological ROS  negative psych ROS   GI/Hepatic Neg liver ROS, GERD-  ,  Endo/Other  negative endocrine ROS  Renal/GU Renal disease  negative genitourinary   Musculoskeletal negative musculoskeletal ROS (+)   Abdominal (+) + obese,   Peds negative pediatric ROS (+)  Hematology negative hematology ROS (+)   Anesthesia Other Findings   Reproductive/Obstetrics negative OB ROS                            Anesthesia Physical Anesthesia Plan  ASA: II  Anesthesia Plan: General   Post-op Pain Management:    Induction: Intravenous  Airway Management Planned: LMA  Additional Equipment:   Intra-op Plan:   Post-operative Plan: Extubation in OR  Informed Consent: I have reviewed the patients History and Physical, chart, labs and discussed the procedure including the risks, benefits and alternatives for the proposed anesthesia with the patient or authorized representative who has indicated his/her understanding and acceptance.   Dental advisory given  Plan Discussed with: CRNA  Anesthesia Plan Comments:        Anesthesia Quick Evaluation

## 2014-08-01 NOTE — Interval H&P Note (Signed)
History and Physical Interval Note:  08/01/2014 1:14 PM  Darrell Perkins  has presented today for surgery, with the diagnosis of RIGHT UPJ LITHIASIS AND RIGHT HYDRONEPHROSIS  The various methods of treatment have been discussed with the patient and family. After consideration of risks, benefits and other options for treatment, the patient has consented to  Procedure(s): CYSTOSCOPY WITH RIGHT  RETROGRADE PYELOGRAM/RIGHT URETEROSCOPY HOLMIUMN LASER LITOTRIPSY  STENT PLACEMENT  (Right) as a surgical intervention .  The patient's history has been reviewed, patient examined, no change in status, stable for surgery.  I have reviewed the patient's chart and labs. He is not having a lot of pain, but has not seen a stone pass. No fever or dysuria. Discussed possibility stone has passed and that he may need a staged procedure/pre-stent. Questions were answered to the patient's satisfaction.  He elects to proceed.    Wendy Mikles

## 2014-08-01 NOTE — Op Note (Signed)
Preoperative diagnosis: Acute on chronic renal failure, right hydronephrosis, right ureteral stone, right renal stone Postoperative diagnosis: Acute on chronic renal failure, right hydronephrosis, right ureteral stones  Procedure: Cystoscopy, right retrograde pyelogram, right ureteroscopy with holmium laser lithotripsy, stone basket extraction and right ureteral stent placement  Exam under anesthesia  Surgeon: Mena GoesEskridge  Anesthesia: Gen.  Indication for procedure: Patient is a 64 year old male with some chronic renal insufficiency which was worsening. Stones not visible on KUB but her right ureteral stone was visible on prior CT. Follow-up renal ultrasound revealed persistent hydronephrosis and he was brought to the operating room for retrograde pyelogram and ureteroscopy.  Findings: On cystoscopy the urethra was normal, the prostatic urethra was normal, there were no stones in the bladder but there were many uric acid crystals and dust. The mucosa appeared normal.  Right retrograde pyelogram-this outlined a single ureter single collecting system unit the distal ureter appeared normal in the mid ureter there was a narrowed curve followed by short area of dilation followed by another area of curvature and narrowing followed by dilation of the ureter and a filling defect consistent with nephrolithiasis with the remainder of the mid and proximal ureter dilated in the collecting system dilated.  On ureteroscopy, the first narrowed area was easily passable the second narrowed area and tortuous ureter was difficult to negotiate. Once above this level the small stone was located but also a large 7 mm stone were located in the mid ureter. it was evident that the small stone was there but also the larger 7 mm stone from the kidney had also passed. Although on CT there was another calcification in the area of the mid ureter but not appeared to be vascular.  On exam under anesthesia the penis was normal  without mass or lesion. The testicles were descended and palpably normal. On digital rectal exam the prostate was small and benign. There were no hard areas or nodules. Landmarks preserved.   Description of procedure: After consent was obtained patient brought to the operating room. After adequate anesthesia he is placed in lithotomy position and prepped and draped in the usual sterile fashion. A timeout was performed to confirm the patient and procedure. Cystoscope was passed per urethra and a cone-tipped catheter was used to cannulate the right ureteral orifice and retrograde pyelogram was performed. A sensor wire was then advanced into the collecting system. A semirigid ureteroscope was advanced adjacent to the wire where the stones were encountered in the mid ureter. On ureteroscopy the first narrowed area was easily passable with the scope, the second narrowed area was a tortuous curve there was difficult to negotiate. There was a small stone in the larger stone wedged at this point. All stones were fragmented and removed except for some small insignificant millimeter or 2 fragments using 200  laser fiber and a setting of 0.2 and 40. Because of the tortuosity of the ureter I thought of placing an access sheath which I attempted to do. A placement access sheath in the distal ureter and advanced the flexible digital scope could not negotiate flexible digital scope around the more proximal curvature. Therefore I repassed the rigid scope and sequentially removed any significant fragments and drop them in the bladder. The ureter appeared clear all the way up into the proximal ureter. Scope was removed and the ureter was noted to be without injury and without significant fragment. The scope was removed. The wire was backloaded on the cystoscope and a 6 x 28 cm stent  was advanced. The wire was removed with a good coil seen in the collecting system and a good coil in the bladder. The bladder was drained and scope  removed.   On exam under anesthesia was performed.   The patient was awakened taken to recovery room in stable condition.   Complications: None  Blood loss: Minimal  Specimens: None   Drains: 6 x 28 cm right ureteral stent with string

## 2014-08-01 NOTE — Anesthesia Procedure Notes (Signed)
Procedure Name: LMA Insertion Date/Time: 08/01/2014 1:30 PM Performed by: Durward ParcelFLYNN-COOK, Chaske Paskett A Pre-anesthesia Checklist: Patient identified, Emergency Drugs available, Suction available, Patient being monitored and Timeout performed Patient Re-evaluated:Patient Re-evaluated prior to inductionOxygen Delivery Method: Circle system utilized Preoxygenation: Pre-oxygenation with 100% oxygen Intubation Type: IV induction Ventilation: Mask ventilation without difficulty LMA: LMA inserted LMA Size: 4.0 Number of attempts: 1 Placement Confirmation: positive ETCO2 and breath sounds checked- equal and bilateral Tube secured with: Tape Dental Injury: Teeth and Oropharynx as per pre-operative assessment

## 2014-08-01 NOTE — Discharge Instructions (Signed)
Ureteral Stent Implantation, Care After Refer to this sheet in the next few weeks. These instructions provide you with information on caring for yourself after your procedure. Your health care provider may also give you more specific instructions. Your treatment has been planned according to current medical practices, but problems sometimes occur. Call your health care provider if you have any problems or questions after your procedure. WHAT TO EXPECT AFTER THE PROCEDURE You should be back to normal activity within 48 hours after the procedure. Nausea and vomiting may occur and are commonly the result of anesthesia. It is common to experience sharp pain in the back or lower abdomen and penis with voiding. This is caused by movement of the ends of the stent with the act of urinating.It usually goes away within minutes after you have stopped urinating. HOME CARE INSTRUCTIONS Make sure to drink plenty of fluids. You may have small amounts of bleeding, causing your urine to be red. This is normal. Certain movements may trigger pain or a feeling that you need to urinate. You may be given medicines to prevent infection or bladder spasms. Be sure to take all medicines as directed. Only take over-the-counter or prescription medicines for pain, discomfort, or fever as directed by your health care provider. Do not take aspirin, as this can make bleeding worse.  REMOVAL OF THE STENT: Remove the stent Tuesday morning, 08/05/2014 by pulling the string with slow steady pressure until the entire stent is removed.  Be sure to keep all follow-up appointments so your health care provider can check that you are healing properly.  SEEK MEDICAL CARE IF:  You experience increasing pain.  Your pain medicine is not working. SEEK IMMEDIATE MEDICAL CARE IF:  Your urine is dark red or has blood clots.  You are leaking urine (incontinent).  You have a fever, chills, feeling sick to your stomach (nausea), or  vomiting.  Your pain is not relieved by pain medicine.  The end of the stent comes out of the urethra.  You are unable to urinate. Document Released: 10/24/2012 Document Revised: 02/26/2013 Document Reviewed: 10/24/2012 [facility] Patient Information 2015 [place], [place]. This information is not intended to replace advice given to you by your health care provider. Make sure you discuss any questions you have with your health care provider.

## 2014-08-01 NOTE — Anesthesia Postprocedure Evaluation (Signed)
  Anesthesia Post-op Note  Patient: Darrell Perkins  Procedure(s) Performed: Procedure(s) (LRB): CYSTOSCOPY WITH RIGHT  RETROGRADE PYELOGRAM/RIGHT URETEROSCOPY HOLMIUMN LASER LITOTRIPSY  STENT PLACEMENT  (Right)  Patient Location: PACU  Anesthesia Type: General  Level of Consciousness: awake and alert   Airway and Oxygen Therapy: Patient Spontanous Breathing  Post-op Pain: mild  Post-op Assessment: Post-op Vital signs reviewed, Patient's Cardiovascular Status Stable, Respiratory Function Stable, Patent Airway and No signs of Nausea or vomiting  Last Vitals:  Filed Vitals:   08/01/14 1529  BP: 131/67  Pulse: 56  Temp: 36.5 C  Resp: 16    Post-op Vital Signs: stable   Complications: No apparent anesthesia complications

## 2014-08-01 NOTE — Transfer of Care (Signed)
Immediate Anesthesia Transfer of Care Note  Patient: Darrell Perkins  Procedure(s) Performed: Procedure(s): CYSTOSCOPY WITH RIGHT  RETROGRADE PYELOGRAM/RIGHT URETEROSCOPY HOLMIUMN LASER LITOTRIPSY  STENT PLACEMENT  (Right)  Patient Location: PACU  Anesthesia Type:General  Level of Consciousness:  sedated, patient cooperative and responds to stimulation  Airway & Oxygen Therapy:Patient Spontanous Breathing and Patient connected to face mask oxgen  Post-op Assessment:  Report given to PACU RN and Post -op Vital signs reviewed and stable  Post vital signs:  Reviewed and stable  Last Vitals:  Filed Vitals:   08/01/14 1044  BP: 148/84  Pulse: 61  Temp: 36.7 C  Resp: 18    Complications: No apparent anesthesia complications

## 2014-08-05 ENCOUNTER — Encounter (HOSPITAL_COMMUNITY): Payer: Self-pay | Admitting: Urology

## 2014-09-02 DIAGNOSIS — Z0000 Encounter for general adult medical examination without abnormal findings: Secondary | ICD-10-CM | POA: Insufficient documentation

## 2014-11-07 ENCOUNTER — Ambulatory Visit: Payer: BLUE CROSS/BLUE SHIELD | Admitting: Internal Medicine

## 2014-11-26 DIAGNOSIS — I10 Essential (primary) hypertension: Secondary | ICD-10-CM | POA: Insufficient documentation

## 2014-11-26 DIAGNOSIS — Z9989 Dependence on other enabling machines and devices: Secondary | ICD-10-CM

## 2014-11-26 DIAGNOSIS — R51 Headache: Secondary | ICD-10-CM

## 2014-11-26 DIAGNOSIS — R079 Chest pain, unspecified: Secondary | ICD-10-CM | POA: Insufficient documentation

## 2014-11-26 DIAGNOSIS — N200 Calculus of kidney: Secondary | ICD-10-CM | POA: Insufficient documentation

## 2014-11-26 DIAGNOSIS — M792 Neuralgia and neuritis, unspecified: Secondary | ICD-10-CM | POA: Insufficient documentation

## 2014-11-26 DIAGNOSIS — J45909 Unspecified asthma, uncomplicated: Secondary | ICD-10-CM | POA: Insufficient documentation

## 2014-11-26 DIAGNOSIS — E291 Testicular hypofunction: Secondary | ICD-10-CM | POA: Insufficient documentation

## 2014-11-26 DIAGNOSIS — J309 Allergic rhinitis, unspecified: Secondary | ICD-10-CM | POA: Insufficient documentation

## 2014-11-26 DIAGNOSIS — B356 Tinea cruris: Secondary | ICD-10-CM | POA: Insufficient documentation

## 2014-11-26 DIAGNOSIS — K5909 Other constipation: Secondary | ICD-10-CM | POA: Insufficient documentation

## 2014-11-26 DIAGNOSIS — N183 Chronic kidney disease, stage 3 unspecified: Secondary | ICD-10-CM | POA: Insufficient documentation

## 2014-11-26 DIAGNOSIS — R0683 Snoring: Secondary | ICD-10-CM | POA: Insufficient documentation

## 2014-11-26 DIAGNOSIS — R519 Headache, unspecified: Secondary | ICD-10-CM | POA: Insufficient documentation

## 2014-11-26 DIAGNOSIS — M170 Bilateral primary osteoarthritis of knee: Secondary | ICD-10-CM

## 2014-11-26 DIAGNOSIS — M1710 Unilateral primary osteoarthritis, unspecified knee: Secondary | ICD-10-CM

## 2014-11-26 DIAGNOSIS — Z86010 Personal history of colonic polyps: Secondary | ICD-10-CM | POA: Insufficient documentation

## 2014-11-26 DIAGNOSIS — E7800 Pure hypercholesterolemia, unspecified: Secondary | ICD-10-CM | POA: Insufficient documentation

## 2014-11-26 DIAGNOSIS — G4710 Hypersomnia, unspecified: Secondary | ICD-10-CM | POA: Insufficient documentation

## 2014-11-26 DIAGNOSIS — R5383 Other fatigue: Secondary | ICD-10-CM | POA: Insufficient documentation

## 2014-11-26 DIAGNOSIS — B353 Tinea pedis: Secondary | ICD-10-CM | POA: Insufficient documentation

## 2014-11-26 DIAGNOSIS — M179 Osteoarthritis of knee, unspecified: Secondary | ICD-10-CM | POA: Insufficient documentation

## 2014-11-26 DIAGNOSIS — R3129 Other microscopic hematuria: Secondary | ICD-10-CM | POA: Insufficient documentation

## 2014-11-26 DIAGNOSIS — E669 Obesity, unspecified: Secondary | ICD-10-CM | POA: Insufficient documentation

## 2014-11-26 DIAGNOSIS — L739 Follicular disorder, unspecified: Secondary | ICD-10-CM | POA: Insufficient documentation

## 2014-11-26 DIAGNOSIS — E782 Mixed hyperlipidemia: Secondary | ICD-10-CM | POA: Insufficient documentation

## 2014-11-26 DIAGNOSIS — G4733 Obstructive sleep apnea (adult) (pediatric): Secondary | ICD-10-CM | POA: Insufficient documentation

## 2014-11-26 DIAGNOSIS — L02419 Cutaneous abscess of limb, unspecified: Secondary | ICD-10-CM | POA: Insufficient documentation

## 2014-11-26 DIAGNOSIS — N5201 Erectile dysfunction due to arterial insufficiency: Secondary | ICD-10-CM | POA: Insufficient documentation

## 2014-11-26 DIAGNOSIS — G5730 Lesion of lateral popliteal nerve, unspecified lower limb: Secondary | ICD-10-CM | POA: Insufficient documentation

## 2014-11-26 DIAGNOSIS — L03119 Cellulitis of unspecified part of limb: Secondary | ICD-10-CM | POA: Insufficient documentation

## 2014-11-26 DIAGNOSIS — G4730 Sleep apnea, unspecified: Secondary | ICD-10-CM

## 2014-11-26 DIAGNOSIS — M79604 Pain in right leg: Secondary | ICD-10-CM | POA: Insufficient documentation

## 2014-11-26 DIAGNOSIS — N529 Male erectile dysfunction, unspecified: Secondary | ICD-10-CM | POA: Insufficient documentation

## 2014-11-26 DIAGNOSIS — L663 Perifolliculitis capitis abscedens: Secondary | ICD-10-CM | POA: Insufficient documentation

## 2014-11-26 DIAGNOSIS — R21 Rash and other nonspecific skin eruption: Secondary | ICD-10-CM | POA: Insufficient documentation

## 2014-11-26 DIAGNOSIS — I4891 Unspecified atrial fibrillation: Secondary | ICD-10-CM | POA: Insufficient documentation

## 2014-11-26 DIAGNOSIS — Z7901 Long term (current) use of anticoagulants: Secondary | ICD-10-CM | POA: Insufficient documentation

## 2014-11-26 HISTORY — DX: Unilateral primary osteoarthritis, unspecified knee: M17.10

## 2014-11-26 HISTORY — DX: Osteoarthritis of knee, unspecified: M17.9

## 2014-12-01 ENCOUNTER — Encounter: Payer: Self-pay | Admitting: Internal Medicine

## 2014-12-01 ENCOUNTER — Ambulatory Visit (INDEPENDENT_AMBULATORY_CARE_PROVIDER_SITE_OTHER): Payer: BLUE CROSS/BLUE SHIELD | Admitting: Internal Medicine

## 2014-12-01 VITALS — BP 132/68 | HR 58 | Ht 74.5 in | Wt 261.0 lb

## 2014-12-01 DIAGNOSIS — R9431 Abnormal electrocardiogram [ECG] [EKG]: Secondary | ICD-10-CM | POA: Diagnosis not present

## 2014-12-01 DIAGNOSIS — R079 Chest pain, unspecified: Secondary | ICD-10-CM

## 2014-12-01 NOTE — Progress Notes (Signed)
Cardiology Office Note   Date:  12/01/2014   ID:  Eilan, Mcinerny 02-22-1951, MRN [id number]  PCP:  Pearla Dubonnet, MD  Cardiologist:   Dietrich Pates, MD   No chief complaint on file.  Pt referred for CP     History of Present Illness: Darrell Perkins is a 64 y.o. male who wa referred for exertional CP  Seen by Jerelyn Scott recently  Also a history of fatigue  Hx of asthma and CKD stage III  Patient says it is occasional  Only when exerting self.  A dull discomfort on R and L side of chest   Last a couple seconds  With activity  Occasional SOB   None at restNo PND  Hx of asthma   Has a lot of wheezing     Current Outpatient Prescriptions  Medication Sig Dispense Refill  . acetaminophen (TYLENOL) 500 MG tablet Take 500 mg by mouth every 6 (six) hours as needed (pain).     Marland Kitchen albuterol (PROVENTIL HFA;VENTOLIN HFA) 108 (90 BASE) MCG/ACT inhaler Inhale 1-2 puffs into the lungs every 6 (six) hours as needed for wheezing. 1 Inhaler 0  . Bee Pollen 580 MG CAPS Take 580 mg by mouth as directed.    . budesonide-formoterol (SYMBICORT) 160-4.5 MCG/ACT inhaler Inhale 2 puffs into the lungs 3 (three) times a week.    . loratadine-pseudoephedrine (CLARITIN-D 24-HOUR) 10-240 MG per 24 hr tablet Take 1 tablet by mouth daily as needed for allergies.    Marland Kitchen losartan (COZAAR) 100 MG tablet Take 100 mg by mouth daily.    . mometasone (NASONEX) 50 MCG/ACT nasal spray Place 1 spray into both nostrils daily as needed (allergies).     Bernadette Hoit Sodium (SENNA-S PO) Take 1 tablet by mouth daily as needed (constipation).      No current facility-administered medications for this visit.    Allergies:   Allopurinol; Aspirin; Prednisone; Rosuvastatin; Simvastatin; and Tuberculin tests   Past Medical History  Diagnosis Date  . Kidney stones   . Irregular heartbeat   . Dysrhythmia   . Sleep apnea     USES CPAP  . Pneumonia     hx of   . Bronchitis     hx of   . GERD (gastroesophageal reflux  disease)   . Rapid atrial fibrillation 11/26/2014  . 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 Colonscopy     . Cystoscopy with retrograde pyelogram, ureteroscopy and stent placement Right 08/01/2014    Procedure: CYSTOSCOPY WITH RIGHT  RETROGRADE PYELOGRAM/RIGHT URETEROSCOPY HOLMIUMN LASER LITOTRIPSY  STENT PLACEMENT ;  Surgeon: Jerilee Field, MD;  Location: [facility];  Service: Urology;  Laterality: Right;     Social History:  The patient  reports that he has never smoked. He has never used smokeless tobacco. He reports that he does not drink alcohol or use illicit drugs.   Family History:  The patient's family history includes Alcohol abuse in his father; COPD in his father; Cerebrovascular Disease in his father; Diabetes in his father; Hypertension in his father and mother.    ROS:  Please see the history of present illness. All other systems are reviewed and  Negative to the above problem except as noted.    PHYSICAL EXAM: VS:  BP 132/68 mmHg  Pulse 58  Ht 6' 2.5" (1.892 m)  Wt 261 lb (118.389 kg)  BMI 33.07 kg/m2  GEN: Well nourished, well  developed, in no acute distress HEENT: normal Neck: no JVD, carotid bruits, or masses Cardiac: RRR; no murmurs, rubs, or gallops,no edema  Respiratory:  clear to auscultation bilaterally, normal work of breathing GI: soft, nontender, nondistended, + BS  No hepatomegaly  MS: no deformity Moving all extremities   Skin: warm and dry, no rash Neuro:  Strength and sensation are intact Psych: euthymic mood, full affect   EKG:  EKG is ordered today.  SB 58 bpm  T wave inversion II, III AVF, V5 and V6.   Lipid Panel No results found for: CHOL, TRIG, HDL, CHOLHDL, VLDL, LDLCALC, LDLDIRECT    Wt Readings from Last 3 Encounters:  12/01/14 261 lb (118.389 kg)  08/01/14 245 lb (111.131 kg)  07/31/14 245 lb (111.131 kg)      ASSESSMENT AND PLAN:  1.  CP  Not typical but has  history of HTN   EKG with some changes  Not diagnositic however I would recomm an echo to evaluate LV size, thickness, function.  If normal consider possible stress test. COntinue activies as tolearted  2.  Asthma  Continue meds  3.  HTN  Adequate control    Signed, Dietrich Pates, MD  12/01/2014 3:50 PM    [facility] [address] [place], [place], [place]  [address] Phone: [phone]; Fax: [phone]

## 2014-12-01 NOTE — Patient Instructions (Signed)

## 2014-12-15 ENCOUNTER — Other Ambulatory Visit: Payer: Self-pay

## 2014-12-15 ENCOUNTER — Ambulatory Visit (HOSPITAL_COMMUNITY): Payer: BLUE CROSS/BLUE SHIELD | Attending: Internal Medicine

## 2014-12-15 DIAGNOSIS — I517 Cardiomegaly: Secondary | ICD-10-CM | POA: Diagnosis not present

## 2014-12-15 DIAGNOSIS — R079 Chest pain, unspecified: Secondary | ICD-10-CM | POA: Diagnosis present

## 2014-12-15 DIAGNOSIS — R9431 Abnormal electrocardiogram [ECG] [EKG]: Secondary | ICD-10-CM | POA: Diagnosis not present

## 2014-12-25 ENCOUNTER — Encounter: Payer: Self-pay | Admitting: *Deleted

## 2014-12-26 ENCOUNTER — Telehealth: Payer: Self-pay | Admitting: *Deleted

## 2014-12-26 DIAGNOSIS — R079 Chest pain, unspecified: Secondary | ICD-10-CM

## 2014-12-26 DIAGNOSIS — R9431 Abnormal electrocardiogram [ECG] [EKG]: Secondary | ICD-10-CM

## 2014-12-26 NOTE — Telephone Encounter (Signed)
Patient called back for echo results Informed of Dr. Tenny Crawoss rec for stress test.  Pt verbalizes understanding.  Adv someone will call him to schedule.  He states the last time he had a stress test (approx 10 years ago) he went into rapid afib.

## 2014-12-28 NOTE — Telephone Encounter (Signed)
Would continue as planned.

## 2014-12-29 ENCOUNTER — Telehealth (HOSPITAL_COMMUNITY): Payer: Self-pay | Admitting: *Deleted

## 2015-01-06 ENCOUNTER — Telehealth (HOSPITAL_COMMUNITY): Payer: Self-pay | Admitting: *Deleted

## 2015-01-06 ENCOUNTER — Encounter: Payer: Self-pay | Admitting: Internal Medicine

## 2015-01-07 ENCOUNTER — Telehealth (HOSPITAL_COMMUNITY): Payer: Self-pay

## 2015-01-07 NOTE — Telephone Encounter (Signed)
Patient given detailed instructions per Myocardial Perfusion Study Information Sheet for the test on 01-07-2015 at 1000. Patient notified to arrive 15 minutes early and that it is imperative to arrive on time for appointment to keep from having the test rescheduled.  If you need to cancel or reschedule your appointment, please call the office within 24 hours of your appointment. Failure to do so may result in a cancellation of your appointment, and a $50 no show fee. Patient verbalized understanding.Randa EvensEdwards, Zelia Yzaguirre A

## 2015-01-07 NOTE — Telephone Encounter (Signed)
Recent phone call to patient giving detailed instructions per Myocardial Perfusion Study Information Sheet for the test but it is on 01-12-2015 at 1000.

## 2015-01-12 ENCOUNTER — Ambulatory Visit (HOSPITAL_COMMUNITY): Payer: BLUE CROSS/BLUE SHIELD | Attending: Internal Medicine

## 2015-01-12 DIAGNOSIS — R0609 Other forms of dyspnea: Secondary | ICD-10-CM | POA: Diagnosis not present

## 2015-01-12 DIAGNOSIS — R9431 Abnormal electrocardiogram [ECG] [EKG]: Secondary | ICD-10-CM | POA: Diagnosis not present

## 2015-01-12 DIAGNOSIS — R5383 Other fatigue: Secondary | ICD-10-CM | POA: Insufficient documentation

## 2015-01-12 DIAGNOSIS — I10 Essential (primary) hypertension: Secondary | ICD-10-CM | POA: Diagnosis not present

## 2015-01-12 DIAGNOSIS — R9439 Abnormal result of other cardiovascular function study: Secondary | ICD-10-CM | POA: Insufficient documentation

## 2015-01-12 DIAGNOSIS — R079 Chest pain, unspecified: Secondary | ICD-10-CM | POA: Diagnosis not present

## 2015-01-12 DIAGNOSIS — R002 Palpitations: Secondary | ICD-10-CM | POA: Diagnosis not present

## 2015-01-12 LAB — MYOCARDIAL PERFUSION IMAGING
CHL CUP NUCLEAR SDS: 4
CHL CUP NUCLEAR SRS: 2
CHL CUP RESTING HR STRESS: 50 {beats}/min
CSEPEDS: 45 s
Estimated workload: 8.1 METS
Exercise duration (min): 6 min
LV dias vol: 140 mL
LV sys vol: 73 mL
MPHR: 154 {beats}/min
NUC STRESS TID: 0.92
Peak HR: 134 {beats}/min
Percent HR: 87 %
RATE: 0.29
RPE: 17
SSS: 6

## 2015-01-12 MED ORDER — TECHNETIUM TC 99M SESTAMIBI GENERIC - CARDIOLITE
10.5000 | Freq: Once | INTRAVENOUS | Status: AC | PRN
  Administered 2015-01-12: 11 via INTRAVENOUS

## 2015-01-12 MED ORDER — TECHNETIUM TC 99M SESTAMIBI GENERIC - CARDIOLITE
31.6000 | Freq: Once | INTRAVENOUS | Status: AC | PRN
  Administered 2015-01-12: 32 via INTRAVENOUS

## 2015-01-22 ENCOUNTER — Telehealth: Payer: Self-pay | Admitting: Internal Medicine

## 2015-01-22 NOTE — Telephone Encounter (Signed)
New message ° ° ° ° ° °Returning a nurses call to get stress test results °

## 2015-01-23 NOTE — Telephone Encounter (Signed)
Left detailed message on patient identified voicemail. Advised to call back if any questions.

## 2016-08-03 ENCOUNTER — Ambulatory Visit (INDEPENDENT_AMBULATORY_CARE_PROVIDER_SITE_OTHER): Payer: Self-pay | Admitting: Orthopaedic Surgery

## 2016-08-17 ENCOUNTER — Other Ambulatory Visit: Payer: Self-pay | Admitting: Internal Medicine

## 2016-08-17 DIAGNOSIS — R3129 Other microscopic hematuria: Secondary | ICD-10-CM

## 2016-08-22 ENCOUNTER — Ambulatory Visit (INDEPENDENT_AMBULATORY_CARE_PROVIDER_SITE_OTHER): Payer: Self-pay | Admitting: Orthopaedic Surgery

## 2016-08-23 ENCOUNTER — Other Ambulatory Visit: Payer: BLUE CROSS/BLUE SHIELD

## 2016-08-29 ENCOUNTER — Other Ambulatory Visit: Payer: BLUE CROSS/BLUE SHIELD

## 2016-09-19 ENCOUNTER — Other Ambulatory Visit: Payer: BLUE CROSS/BLUE SHIELD

## 2016-10-05 DIAGNOSIS — I483 Typical atrial flutter: Secondary | ICD-10-CM

## 2016-10-05 HISTORY — DX: Typical atrial flutter: I48.3

## 2016-10-10 ENCOUNTER — Other Ambulatory Visit: Payer: BLUE CROSS/BLUE SHIELD

## 2016-10-17 ENCOUNTER — Other Ambulatory Visit: Payer: BLUE CROSS/BLUE SHIELD

## 2016-10-17 ENCOUNTER — Ambulatory Visit
Admission: RE | Admit: 2016-10-17 | Discharge: 2016-10-17 | Disposition: A | Discharge status: Home / Self Care | Payer: No Typology Code available for payment source | Source: Ambulatory Visit | Attending: Internal Medicine | Admitting: Internal Medicine

## 2016-10-17 DIAGNOSIS — R3129 Other microscopic hematuria: Secondary | ICD-10-CM

## 2016-11-02 ENCOUNTER — Emergency Department (HOSPITAL_COMMUNITY): Payer: PRIVATE HEALTH INSURANCE

## 2016-11-02 ENCOUNTER — Observation Stay (HOSPITAL_COMMUNITY)
Admission: EM | Admit: 2016-11-02 | Discharge: 2016-11-04 | Disposition: A | Discharge status: Home / Self Care | Payer: PRIVATE HEALTH INSURANCE | Attending: Family Medicine | Admitting: Family Medicine

## 2016-11-02 ENCOUNTER — Encounter (HOSPITAL_COMMUNITY): Payer: Self-pay

## 2016-11-02 DIAGNOSIS — E785 Hyperlipidemia, unspecified: Secondary | ICD-10-CM | POA: Diagnosis not present

## 2016-11-02 DIAGNOSIS — G4733 Obstructive sleep apnea (adult) (pediatric): Secondary | ICD-10-CM | POA: Diagnosis not present

## 2016-11-02 DIAGNOSIS — N200 Calculus of kidney: Secondary | ICD-10-CM | POA: Diagnosis not present

## 2016-11-02 DIAGNOSIS — I10 Essential (primary) hypertension: Secondary | ICD-10-CM | POA: Diagnosis not present

## 2016-11-02 DIAGNOSIS — Z9989 Dependence on other enabling machines and devices: Secondary | ICD-10-CM

## 2016-11-02 DIAGNOSIS — N183 Chronic kidney disease, stage 3 unspecified: Secondary | ICD-10-CM | POA: Diagnosis present

## 2016-11-02 DIAGNOSIS — I129 Hypertensive chronic kidney disease with stage 1 through stage 4 chronic kidney disease, or unspecified chronic kidney disease: Secondary | ICD-10-CM | POA: Diagnosis not present

## 2016-11-02 DIAGNOSIS — J45909 Unspecified asthma, uncomplicated: Secondary | ICD-10-CM | POA: Insufficient documentation

## 2016-11-02 DIAGNOSIS — I4891 Unspecified atrial fibrillation: Secondary | ICD-10-CM | POA: Diagnosis not present

## 2016-11-02 DIAGNOSIS — D72819 Decreased white blood cell count, unspecified: Secondary | ICD-10-CM | POA: Diagnosis present

## 2016-11-02 DIAGNOSIS — Z79899 Other long term (current) drug therapy: Secondary | ICD-10-CM | POA: Insufficient documentation

## 2016-11-02 DIAGNOSIS — I483 Typical atrial flutter: Secondary | ICD-10-CM | POA: Diagnosis not present

## 2016-11-02 DIAGNOSIS — R000 Tachycardia, unspecified: Secondary | ICD-10-CM | POA: Insufficient documentation

## 2016-11-02 DIAGNOSIS — R002 Palpitations: Secondary | ICD-10-CM | POA: Diagnosis present

## 2016-11-02 LAB — BASIC METABOLIC PANEL
ANION GAP: 8 (ref 5–15)
BUN: 19 mg/dL (ref 6–20)
CO2: 24 mmol/L (ref 22–32)
Calcium: 8.9 mg/dL (ref 8.9–10.3)
Chloride: 107 mmol/L (ref 101–111)
Creatinine, Ser: 1.98 mg/dL — ABNORMAL HIGH (ref 0.61–1.24)
GFR calc Af Amer: 38 mL/min — ABNORMAL LOW (ref >60)
GFR calc non Af Amer: 33 mL/min — ABNORMAL LOW (ref >60)
GLUCOSE: 107 mg/dL — AB (ref 65–99)
POTASSIUM: 4 mmol/L (ref 3.5–5.1)
Sodium: 139 mmol/L (ref 135–145)

## 2016-11-02 LAB — CBC WITH DIFFERENTIAL/PLATELET
BASOS ABS: 0 10*3/uL (ref 0.0–0.1)
Basophils Relative: 1 %
EOS PCT: 3 %
Eosinophils Absolute: 0.1 10*3/uL (ref 0.0–0.7)
HEMATOCRIT: 38.3 % — AB (ref 39.0–52.0)
Hemoglobin: 12.3 g/dL — ABNORMAL LOW (ref 13.0–17.0)
Lymphocytes Relative: 37 %
Lymphs Abs: 1.4 10*3/uL (ref 0.7–4.0)
MCH: 28.7 pg (ref 26.0–34.0)
MCHC: 32.1 g/dL (ref 30.0–36.0)
MCV: 89.5 fL (ref 78.0–100.0)
Monocytes Absolute: 0.3 10*3/uL (ref 0.1–1.0)
Monocytes Relative: 9 %
NEUTROS ABS: 1.8 10*3/uL (ref 1.7–7.7)
Neutrophils Relative %: 50 %
PLATELETS: 237 10*3/uL (ref 150–400)
RBC: 4.28 MIL/uL (ref 4.22–5.81)
RDW: 13.6 % (ref 11.5–15.5)
WBC: 3.7 10*3/uL — AB (ref 4.0–10.5)

## 2016-11-02 LAB — I-STAT TROPONIN, ED: Troponin i, poc: 0 ng/mL (ref 0.00–0.08)

## 2016-11-02 LAB — PROTIME-INR
INR: 0.97
Prothrombin Time: 12.8 seconds (ref 11.4–15.2)

## 2016-11-02 MED ORDER — ONDANSETRON HCL 4 MG/2ML IJ SOLN: 4.0000 mg | Freq: Four times a day (QID) | INTRAMUSCULAR | Status: DC | PRN

## 2016-11-02 MED ORDER — LOSARTAN POTASSIUM 50 MG PO TABS
100.0000 mg | ORAL_TABLET | Freq: Every day | ORAL | Status: DC
  Administered 2016-11-03 – 2016-11-04 (×2): 100 mg via ORAL
  Filled 2016-11-02 (×2): qty 2

## 2016-11-02 MED ORDER — SODIUM CHLORIDE 0.9 % IV SOLN
Freq: Once | INTRAVENOUS | Status: AC
  Administered 2016-11-02: 19:00:00 via INTRAVENOUS

## 2016-11-02 MED ORDER — FLUTICASONE PROPIONATE 50 MCG/ACT NA SUSP
1.0000 | Freq: Every day | NASAL | Status: DC | PRN
  Filled 2016-11-02: qty 16

## 2016-11-02 MED ORDER — DILTIAZEM LOAD VIA INFUSION
20.0000 mg | Freq: Once | INTRAVENOUS | Status: AC
  Administered 2016-11-02: 20 mg via INTRAVENOUS
  Filled 2016-11-02: qty 20

## 2016-11-02 MED ORDER — ACETAMINOPHEN 500 MG PO TABS: 500.0000 mg | ORAL_TABLET | Freq: Four times a day (QID) | ORAL | Status: DC | PRN

## 2016-11-02 MED ORDER — ENOXAPARIN SODIUM 120 MG/0.8ML ~~LOC~~ SOLN
1.0000 mg/kg | Freq: Two times a day (BID) | SUBCUTANEOUS | Status: DC
  Administered 2016-11-03: 120 mg via SUBCUTANEOUS
  Filled 2016-11-02 (×2): qty 0.8

## 2016-11-02 MED ORDER — LOSARTAN POTASSIUM 50 MG PO TABS: 100.0000 mg | ORAL_TABLET | Freq: Every day | ORAL | Status: DC

## 2016-11-02 MED ORDER — ONDANSETRON HCL 4 MG PO TABS: 4.0000 mg | ORAL_TABLET | Freq: Four times a day (QID) | ORAL | Status: DC | PRN

## 2016-11-02 MED ORDER — ALBUTEROL SULFATE HFA 108 (90 BASE) MCG/ACT IN AERS: 1.0000 | INHALATION_SPRAY | Freq: Four times a day (QID) | RESPIRATORY_TRACT | Status: DC | PRN

## 2016-11-02 MED ORDER — MOMETASONE FURO-FORMOTEROL FUM 200-5 MCG/ACT IN AERO
2.0000 | INHALATION_SPRAY | Freq: Two times a day (BID) | RESPIRATORY_TRACT | Status: DC
  Administered 2016-11-03 (×2): 2 via RESPIRATORY_TRACT
  Filled 2016-11-02: qty 8.8

## 2016-11-02 MED ORDER — DILTIAZEM HCL 100 MG IV SOLR
5.0000 mg/h | INTRAVENOUS | Status: DC
  Administered 2016-11-02: 5 mg/h via INTRAVENOUS
  Administered 2016-11-03 (×2): 10 mg/h via INTRAVENOUS
  Filled 2016-11-02 (×4): qty 100

## 2016-11-02 MED ORDER — ALBUTEROL SULFATE (2.5 MG/3ML) 0.083% IN NEBU: 2.5000 mg | INHALATION_SOLUTION | Freq: Four times a day (QID) | RESPIRATORY_TRACT | Status: DC | PRN

## 2016-11-02 NOTE — ED Notes (Signed)
ED Provider at bedside requesting rate increase to 10 mg on diltiazem

## 2016-11-02 NOTE — ED Notes (Signed)
MD advises not to increase diltiazem at this time.

## 2016-11-02 NOTE — ED Notes (Signed)
Patient transported to CT 

## 2016-11-02 NOTE — ED Notes (Signed)
Attempted report 

## 2016-11-02 NOTE — Progress Notes (Signed)
ANTICOAGULATION CONSULT NOTE - Initial Consult  Pharmacy Consult for enoxaparin Indication: atrial fibrillation  Allergies  Allergen Reactions  . Allopurinol Other (See Comments)    "Upset stomach"  . Aspirin     Upset stomach  . Prednisone Other (See Comments)    "Irritable"  . Rosuvastatin Other (See Comments)    Mental disturbance  . Simvastatin Other (See Comments)    Tired and achy  . Tuberculin Tests Other (See Comments)    Intense local reaction    Patient Measurements: Height: 6\' 3"  (190.5 cm) Weight: 264 lb (119.7 kg) IBW/kg (Calculated) : 84.5  Vital Signs: Temp: 97.7 F (36.5 C) (08/29 1739) Temp Source: Oral (08/29 1739) BP: 154/93 (08/29 2015) Pulse Rate: 53 (08/29 2030)  Labs:  Recent Labs  11/02/16 1842  HGB 12.3*  HCT 38.3*  PLT 237  LABPROT 12.8  INR 0.97  CREATININE 1.98*    Estimated Creatinine Clearance: 50.5 mL/min (A) (by C-G formula based on SCr of 1.98 mg/dL (H)).  Assessment: CC/HPI: 66 yo m presenting with heart palpations  PMH: afib, gerd  Anticoag: none pta   Renal: SCr 1.98   Heme/Onc: H&H 12.3/38.3, Plt 237  Plan:  Enox 1 mg/kg q12h  Monitor renal fx, OAC  Isaac Bliss, PharmD, BCPS, BCCCP Clinical Pharmacist Clinical phone for 11/02/2016 from 7a-3:30p: [phone] If after 3:30p, please call main pharmacy at: [phone] 11/02/2016 9:24 PM

## 2016-11-02 NOTE — ED Triage Notes (Signed)
Pt BIB [facility] from urgent care where pt went to be evaluated for "pounding in his chest" since Tuesday. Pt reports history of A Fib. Pt also reports passing 4 kidney stones in the last week.

## 2016-11-02 NOTE — H&P (Signed)
History and Physical    CACE OSORTO ZOX:[id number] DOB: [birth year] DOA: 11/02/2016  Referring MD/NP/PA:Dr. Rolland Porter PCP: [name], MD  Patient coming from: home  Chief Complaint: Heart racing  HPI: Darrell Perkins is a 66 y.o. male with medical history significant of remote history of A. fib not on anticoagulation, HTN, COPD, CKD stage III, nephrolithiasis, obesity, and OSA on CPAP; who presents with complaints of feeling his heart racing since yesterday. He states that it felt like his heart was pounding out of his chest but denies any significant complaints of shortness of breath, chest pain, leg swelling, cough, fever, chills, nausea, vomiting, abdominal pain, diarrhea, constipation, blood in stool/urine, or change in weight. Patient notes that he had similar symptoms like this approximately 25 years ago for which he was placed on medicine to control the rate of his heart, but he is no longer on that medication and is not followed by a cardiologist. He reports passing multiple cystic kidney stones in the last week. He has a history of kidney disease for which his primary care provider set him up to be seen by a nephrologist on September 3 in [facility]. Review of records shows he had an ultrasound done on 10/17/2016 which showed a small echogenic mass alongside the posterior bladder wall,  a 1.8 cm complex cyst of the left kidney and a 7 mm nonobstructing right renal stone.   ED Course: Upon admission into the emergency department patient was noted to have heart rates as high as 155. Initial EKG does patient was in A. fib with RVR here started on a diltiazem drip with improvement of rate control. Has revealed WBC 3.7, hemoglobin 12.3, potassium 4, BUN 19, and creatinine 1.98 . Chest x-ray showed no clear signs of any edema or an acute infiltrate. Urinalysis had not been obtained.  Review of Systems  Constitutional: Negative for chills, fever and weight loss.  HENT: Negative for ear discharge and  ear pain.   Eyes: Negative for double vision and photophobia.  Respiratory: Negative for hemoptysis, sputum production and shortness of breath.   Cardiovascular: Positive for palpitations. Negative for leg swelling.  Gastrointestinal: Negative for abdominal pain, nausea and vomiting.  Genitourinary: Negative for frequency and hematuria.  Musculoskeletal: Positive for joint pain. Negative for back pain.  Skin: Negative for itching and rash.  Neurological: Negative for speech change, focal weakness and headaches.  Psychiatric/Behavioral: Negative for hallucinations and substance abuse.    Past Medical History:  Diagnosis Date  . Bronchitis    hx of   . 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 Dysrhythmia   . GERD (gastroesophageal reflux disease)   . Irregular heartbeat   . Kidney stones   . Pneumonia    hx of   . Rapid atrial fibrillation (HCC) 11/26/2014  . Sleep apnea    USES CPAP    Past Surgical History:  Procedure Laterality Date  . colonscopy     . CYSTOSCOPY WITH RETROGRADE PYELOGRAM, URETEROSCOPY AND STENT PLACEMENT Right 08/01/2014   Procedure: CYSTOSCOPY WITH RIGHT  RETROGRADE PYELOGRAM/RIGHT URETEROSCOPY HOLMIUMN LASER LITOTRIPSY  STENT PLACEMENT ;  Surgeon: Jerilee Field, MD;  Location: [facility];  Service: Urology;  Laterality: Right;     reports that he has never smoked. He has never used smokeless tobacco. He reports that he does not drink alcohol or use drugs.  Allergies  Allergen Reactions  . Allopurinol Other (See Comments)    "Upset stomach"  . Aspirin  Upset stomach  . Prednisone Other (See Comments)    "Irritable"  . Rosuvastatin Other (See Comments)    Mental disturbance  . Simvastatin Other (See Comments)    Tired and achy  . Tuberculin Tests Other (See Comments)    Intense local reaction    Family History  Problem Relation Age of Onset  . Hypertension Mother   . Diabetes Father   . Cerebrovascular Disease Father   . Alcohol  abuse Father   . Hypertension Father   . COPD Father     Prior to Admission medications   Medication Sig Start Date End Date Taking? Authorizing Provider  acetaminophen (TYLENOL) 500 MG tablet Take 500 mg by mouth every 6 (six) hours as needed (pain).  06/20/14  Yes [provider]  albuterol (PROVENTIL HFA;VENTOLIN HFA) 108 (90 BASE) MCG/ACT inhaler Inhale 1-2 puffs into the lungs every 6 (six) hours as needed for wheezing. 04/14/12  Yes Sunnie Nielsen, MD  Bee Pollen 580 MG CAPS Take 580 mg by mouth as directed.   Yes [provider]  budesonide-formoterol (SYMBICORT) 160-4.5 MCG/ACT inhaler Inhale 2 puffs into the lungs 3 (three) times a week.   Yes [provider]  loratadine-pseudoephedrine (CLARITIN-D 24-HOUR) 10-240 MG per 24 hr tablet Take 1 tablet by mouth daily as needed for allergies.   Yes [provider]  losartan (COZAAR) 100 MG tablet Take 100 mg by mouth daily.   Yes [provider]  meloxicam (MOBIC) 15 MG tablet Take 15 mg by mouth every other day. 09/01/16  Yes [provider]  mometasone (NASONEX) 50 MCG/ACT nasal spray Place 1 spray into both nostrils daily as needed (allergies).  08/21/12  Yes [provider]    Physical Exam:  Constitutional: Obese male in NAD, calm, comfortable Vitals:   11/02/16 1945 11/02/16 2000 11/02/16 2015 11/02/16 2030  BP: 137/83 (!) 147/87 (!) 154/93   Pulse: (!) 50 63 65 (!) 53  Resp: (!) 26 18 (!) 22 14  Temp:      TempSrc:      SpO2: 96% 96% 98% 99%  Weight:      Height:       Eyes: PERRL, lids and conjunctivae normal ENMT: Mucous membranes are moist. Posterior pharynx clear of any exudate or lesions.Normal dentition.  Neck: normal, supple, no masses, no thyromegaly Respiratory: clear to auscultation bilaterally, no wheezing, no crackles. Normal respiratory effort. No accessory muscle use.  Cardiovascular: Irregular irregular rhythm, no murmurs / rubs / gallops. No extremity  edema. 2+ pedal pulses. No carotid bruits.  Abdomen: no tenderness, no masses palpated. No hepatosplenomegaly. Bowel sounds positive.  Musculoskeletal: no clubbing / cyanosis. No joint deformity upper and lower extremities. Good ROM, no contractures. Normal muscle tone.  Skin: no rashes, lesions, ulcers. No induration Neurologic: CN 2-12 grossly intact. Sensation intact, DTR normal. Strength 5/5 in all 4.  Psychiatric: Normal judgment and insight. Alert and oriented x 3. Normal mood.     Labs on Admission: I have personally reviewed following labs and imaging studies  CBC:  Recent Labs Lab 11/02/16 1842  WBC 3.7*  NEUTROABS 1.8  HGB 12.3*  HCT 38.3*  MCV 89.5  PLT 237   Basic Metabolic Panel:  Recent Labs Lab 11/02/16 1842  NA 139  K 4.0  CL 107  CO2 24  GLUCOSE 107*  BUN 19  CREATININE 1.98*  CALCIUM 8.9   GFR: Estimated Creatinine Clearance: 50.5 mL/min (A) (by C-G formula based on SCr of  1.98 mg/dL (H)). Liver Function Tests: No results for input(s): AST, ALT, ALKPHOS, BILITOT, PROT, ALBUMIN in the last 168 hours. No results for input(s): LIPASE, AMYLASE in the last 168 hours. No results for input(s): AMMONIA in the last 168 hours. Coagulation Profile:  Recent Labs Lab 11/02/16 1842  INR 0.97   Cardiac Enzymes: No results for input(s): CKTOTAL, CKMB, CKMBINDEX, TROPONINI in the last 168 hours. BNP (last 3 results) No results for input(s): PROBNP in the last 8760 hours. HbA1C: No results for input(s): HGBA1C in the last 72 hours. CBG: No results for input(s): GLUCAP in the last 168 hours. Lipid Profile: No results for input(s): CHOL, HDL, LDLCALC, TRIG, CHOLHDL, LDLDIRECT in the last 72 hours. Thyroid Function Tests: No results for input(s): TSH, T4TOTAL, FREET4, T3FREE, THYROIDAB in the last 72 hours. Anemia Panel: No results for input(s): VITAMINB12, FOLATE, FERRITIN, TIBC, IRON, RETICCTPCT in the last 72 hours. Urine analysis:    Component Value  Date/Time   COLORURINE YELLOW 07/01/2014 0813   APPEARANCEUR CLOUDY (A) 07/01/2014 0813   LABSPEC 1.017 07/01/2014 0813   PHURINE 6.0 07/01/2014 0813   GLUCOSEU NEGATIVE 07/01/2014 0813   HGBUR SMALL (A) 07/01/2014 0813   BILIRUBINUR NEGATIVE 07/01/2014 0813   KETONESUR NEGATIVE 07/01/2014 0813   PROTEINUR NEGATIVE 07/01/2014 0813   UROBILINOGEN 1.0 07/01/2014 0813   NITRITE NEGATIVE 07/01/2014 0813   LEUKOCYTESUR NEGATIVE 07/01/2014 0813   Sepsis Labs: No results found for this or any previous visit (from the past 240 hour(s)).   Radiological Exams on Admission: Dg Chest Port 1 View  Result Date: 11/02/2016 CLINICAL DATA:  Pt BIB [facility] from urgent care where pt went to be evaluated for "pounding in his chest" since Tuesday. Pt reports history of A Fib. Pt also reports passing 4 kidney stones in the last week. EXAM: PORTABLE CHEST 1 VIEW COMPARISON:  04/14/2012 FINDINGS: Heart size is accentuated by the portable AP position. There is minimal left lower lobe atelectasis. There are no focal consolidations or pleural effusions. No pulmonary edema. IMPRESSION: No evidence for acute  abnormality. Electronically Signed   By: Norva PavlovElizabeth  Brown M.D.   On: 11/02/2016 18:54    EKG: Independently reviewed. A. fib with RVR  Assessment/Plan Atrial fibrillation with RVR: Patient presents with complaints of palpitations. Positive be in A. fib with RVR heart rates up into the 150s. Patient was started on diltiazem drip in the ED. Previously known to have A. fib in the past, but not currently on any rate controlling medicines. Chadsvasc score=2 - Admit to a stepdown bed - Atrial fibrillation focusedorders initiated - Continue Cardizem gtt per protocol - Correcting for electrolyte abnormalities(Goal potassium 4 and magnesium 2) - Check TSH, trend troponins - Check echocardiogram in a.m. - Cardiology consulted by ED physician, reconsult in a.m. if needed  Chronic kidney disease stage III,  nephrolithiasis, possible bladder mass: Creatinine 1.98 on admission. Review of records shows that patient's creatinine has been as high as 3 back in 2016.Patient reports that he has a follow-up appointment with nephrology on September 3. Patient recently had a renal ultrasound showing a possible bladder mass, complex renal cyst, and 7 mm nonobstructing renal stone.  - Follow-up with nephrology as outpatient  Leukopenia: WBC 3.7. Patient denies any report of any fever or infectious symptoms. - Follow-up urinalysis -  Repeat CBC in a.m.  Anemia: Hemoglobin 12.3 on admission, but appears otherwise stable. - Continue to monitor   Essential hypertension - Continue losartan    COPD, without  exacerbation - Pharmacy substitution of Dulera for Symbicort and  albuterol inhaler prn SOB/wheezing  OSA on CPAP - CPAP at night per respiratory therapy   DVT prophylaxis: Full dose Lovenox Code Status: Full Family Communication: No family present at bedside  Disposition Plan: afib with rvr  Consults called: Cards  Admission status: observation Clydie Braun MD [facility] Pager [phone]   If 7PM-7AM, please contact night-coverage [email or web address] Password [facility]  11/02/2016, 8:57 PM

## 2016-11-03 ENCOUNTER — Observation Stay (HOSPITAL_BASED_OUTPATIENT_CLINIC_OR_DEPARTMENT_OTHER): Payer: PRIVATE HEALTH INSURANCE

## 2016-11-03 DIAGNOSIS — I4891 Unspecified atrial fibrillation: Secondary | ICD-10-CM | POA: Diagnosis not present

## 2016-11-03 DIAGNOSIS — I422 Other hypertrophic cardiomyopathy: Secondary | ICD-10-CM | POA: Diagnosis not present

## 2016-11-03 DIAGNOSIS — N183 Chronic kidney disease, stage 3 (moderate): Secondary | ICD-10-CM

## 2016-11-03 DIAGNOSIS — G4733 Obstructive sleep apnea (adult) (pediatric): Secondary | ICD-10-CM | POA: Diagnosis not present

## 2016-11-03 DIAGNOSIS — I10 Essential (primary) hypertension: Secondary | ICD-10-CM

## 2016-11-03 DIAGNOSIS — I519 Heart disease, unspecified: Secondary | ICD-10-CM

## 2016-11-03 DIAGNOSIS — D72819 Decreased white blood cell count, unspecified: Secondary | ICD-10-CM | POA: Diagnosis present

## 2016-11-03 DIAGNOSIS — I483 Typical atrial flutter: Secondary | ICD-10-CM | POA: Diagnosis not present

## 2016-11-03 DIAGNOSIS — I119 Hypertensive heart disease without heart failure: Secondary | ICD-10-CM

## 2016-11-03 DIAGNOSIS — Z9989 Dependence on other enabling machines and devices: Secondary | ICD-10-CM | POA: Diagnosis not present

## 2016-11-03 LAB — URINALYSIS, ROUTINE W REFLEX MICROSCOPIC
BACTERIA UA: NONE SEEN
Bilirubin Urine: NEGATIVE
Glucose, UA: NEGATIVE mg/dL
KETONES UR: NEGATIVE mg/dL
Leukocytes, UA: NEGATIVE
Nitrite: NEGATIVE
PROTEIN: NEGATIVE mg/dL
SQUAMOUS EPITHELIAL / LPF: NONE SEEN
Specific Gravity, Urine: 1.013 (ref 1.005–1.030)
pH: 5 (ref 5.0–8.0)

## 2016-11-03 LAB — TROPONIN I: Troponin I: <0.03 ng/mL (ref <0.03)

## 2016-11-03 LAB — BASIC METABOLIC PANEL
ANION GAP: 7 (ref 5–15)
BUN: 17 mg/dL (ref 6–20)
CALCIUM: 8.5 mg/dL — AB (ref 8.9–10.3)
CO2: 24 mmol/L (ref 22–32)
Chloride: 110 mmol/L (ref 101–111)
Creatinine, Ser: 1.67 mg/dL — ABNORMAL HIGH (ref 0.61–1.24)
GFR, EST AFRICAN AMERICAN: 47 mL/min — AB (ref >60)
GFR, EST NON AFRICAN AMERICAN: 41 mL/min — AB (ref >60)
Glucose, Bld: 137 mg/dL — ABNORMAL HIGH (ref 65–99)
Potassium: 3.6 mmol/L (ref 3.5–5.1)
Sodium: 141 mmol/L (ref 135–145)

## 2016-11-03 LAB — ECHOCARDIOGRAM COMPLETE
Height: 75 in
Weight: 4127.01 oz

## 2016-11-03 LAB — TSH: TSH: 1.494 u[IU]/mL (ref 0.350–4.500)

## 2016-11-03 LAB — CBC
HEMATOCRIT: 37.5 % — AB (ref 39.0–52.0)
Hemoglobin: 11.7 g/dL — ABNORMAL LOW (ref 13.0–17.0)
MCH: 28 pg (ref 26.0–34.0)
MCHC: 31.2 g/dL (ref 30.0–36.0)
MCV: 89.7 fL (ref 78.0–100.0)
PLATELETS: 217 10*3/uL (ref 150–400)
RBC: 4.18 MIL/uL — AB (ref 4.22–5.81)
RDW: 13.8 % (ref 11.5–15.5)
WBC: 3.5 10*3/uL — AB (ref 4.0–10.5)

## 2016-11-03 LAB — MAGNESIUM: Magnesium: 1.7 mg/dL (ref 1.7–2.4)

## 2016-11-03 MED ORDER — DILTIAZEM HCL ER COATED BEADS 180 MG PO CP24
360.0000 mg | ORAL_CAPSULE | Freq: Every day | ORAL | Status: DC
  Administered 2016-11-03 – 2016-11-04 (×2): 360 mg via ORAL
  Filled 2016-11-03 (×2): qty 2

## 2016-11-03 MED ORDER — ENOXAPARIN SODIUM 120 MG/0.8ML ~~LOC~~ SOLN
120.0000 mg | Freq: Two times a day (BID) | SUBCUTANEOUS | Status: DC
  Administered 2016-11-03: 120 mg via SUBCUTANEOUS
  Filled 2016-11-03: qty 0.8

## 2016-11-03 MED ORDER — POTASSIUM CHLORIDE CRYS ER 20 MEQ PO TBCR
40.0000 meq | EXTENDED_RELEASE_TABLET | Freq: Every day | ORAL | Status: DC
  Administered 2016-11-03 – 2016-11-04 (×2): 40 meq via ORAL
  Filled 2016-11-03 (×2): qty 2

## 2016-11-03 MED ORDER — SENNOSIDES-DOCUSATE SODIUM 8.6-50 MG PO TABS
1.0000 | ORAL_TABLET | Freq: Every evening | ORAL | Status: DC | PRN
  Administered 2016-11-03: 1 via ORAL
  Filled 2016-11-03: qty 1

## 2016-11-03 MED ORDER — MAGNESIUM SULFATE 2 GM/50ML IV SOLN
2.0000 g | Freq: Once | INTRAVENOUS | Status: AC
  Administered 2016-11-03: 2 g via INTRAVENOUS
  Filled 2016-11-03: qty 50

## 2016-11-03 MED ORDER — OFF THE BEAT BOOK
Freq: Once | Status: AC
  Administered 2016-11-03: 08:00:00
  Filled 2016-11-03: qty 1

## 2016-11-03 MED ORDER — RIVAROXABAN 20 MG PO TABS
20.0000 mg | ORAL_TABLET | Freq: Every day | ORAL | Status: DC
  Administered 2016-11-03: 20 mg via ORAL
  Filled 2016-11-03: qty 1

## 2016-11-03 NOTE — Progress Notes (Signed)
  Echocardiogram 2D Echocardiogram has been performed.  Janalyn HarderWest, Jahmai Finelli R 11/03/2016, 10:38 AM

## 2016-11-03 NOTE — Progress Notes (Signed)
RT NOTE:  Pt believes he is able to manage CPAP tonight. RN in room and will call RT if patient needs assistance.

## 2016-11-03 NOTE — Consult Note (Signed)
 Consult Note  Patient Name: Darrell Perkins Date of Encounter: 11/03/2016  Primary Cardiologist: Ross, Paula  Subjective   Pt is a 65 yo male with PMH of Asthma, CKD III, OSA, HTN, Paroxysmal atrial fibrillation(not documented but per pt first episode occurred 25 years ago in [place] he thinks, had another episode during a stress test 12-13 years ago), Nephrolithiasis stent placement 2016, new recent stones passed this week, comes to the ED because he felt his heart racing on Monday night after passing a kidney stone.  When it continued he decided to come in and get checked out.  He denies any shortness of breath, chest pain, does report some increased sweating.  In the ED the pt was found to have a rate of 150 reported to be in afib w/RVR but it's possible pt was in Aflutter with variable block ecg result not in system. He was placed on a cardizem drip and has remained rate controlled since then and is still asymptomatic.  His most recent ecg shows patient is in counterclockwise Aflutter with 4:1 block   Inpatient Medications    Scheduled Meds: . enoxaparin (LOVENOX) injection  120 mg Subcutaneous Q12H  . losartan  100 mg Oral Daily  . mometasone-formoterol  2 puff Inhalation BID  . potassium chloride  40 mEq Oral Daily   Continuous Infusions: . diltiazem (CARDIZEM) infusion 10 mg/hr (11/03/16 1223)  . magnesium sulfate 1 - 4 g bolus IVPB     PRN Meds: acetaminophen, albuterol, fluticasone, ondansetron **OR** ondansetron (ZOFRAN) IV   Vital Signs    Vitals:   11/03/16 0417 11/03/16 0800 11/03/16 0852 11/03/16 1151  BP: 139/88 (!) 139/96 (!) 144/97 (!) 161/95  Pulse: 78 79    Resp: 14 17    Temp: 97.8 F (36.6 C)  98.1 F (36.7 C) 98.2 F (36.8 C)  TempSrc: Axillary  Oral Oral  SpO2: 98% 97% 98% 100%  Weight: 117 kg (257 lb 15 oz)     Height:        Intake/Output Summary (Last 24 hours) at 11/03/16 1414 Last data filed at 11/03/16 1100  Gross per 24 hour  Intake                 0 ml  Output              600 ml  Net             -600 ml   Filed Weights   11/02/16 1740 11/02/16 2217 11/03/16 0417  Weight: 119.7 kg (264 lb) 117.2 kg (258 lb 6.4 oz) 117 kg (257 lb 15 oz)    Telemetry    Atrial flutter with 4:1 block - Personally Reviewed  ECG    Atrial flutter with 4:1 block, normal axis, no ST changes - Personally Reviewed  Physical Exam   GEN: No acute distress.   Neck: No JVD Cardiac: RRR, no murmurs, rubs, or gallops.  Respiratory: Clear to auscultation bilaterally. GI: Soft, nontender, non-distended  MS: No edema; No deformity. Neuro:  Nonfocal  Psych: Normal affect   Labs    Chemistry Recent Labs Lab 11/02/16 1842 11/02/16 2335  NA 139 141  K 4.0 3.6  CL 107 110  CO2 24 24  GLUCOSE 107* 137*  BUN 19 17  CREATININE 1.98* 1.67*  CALCIUM 8.9 8.5*  GFRNONAA 33* 41*  GFRAA 38* 47*  ANIONGAP 8 7     Hematology Recent Labs Lab 11/02/16 1842 11/02/16 2335    WBC 3.7* 3.5*  RBC 4.28 4.18*  HGB 12.3* 11.7*  HCT 38.3* 37.5*  MCV 89.5 89.7  MCH 28.7 28.0  MCHC 32.1 31.2  RDW 13.6 13.8  PLT 237 217    Cardiac Enzymes Recent Labs Lab 11/02/16 2329 11/02/16 2335 11/03/16 0714  TROPONINI <0.03 <0.03 <0.03    Recent Labs Lab 11/02/16 1856  TROPIPOC 0.00     BNPNo results for input(s): BNP, PROBNP in the last 168 hours.   DDimer No results for input(s): DDIMER in the last 168 hours.   Radiology    Dg Chest Port 1 View  Result Date: 11/02/2016 CLINICAL DATA:  Pt BIB [facility] from urgent care where pt went to be evaluated for "pounding in his chest" since Tuesday. Pt reports history of A Fib. Pt also reports passing 4 kidney stones in the last week. EXAM: PORTABLE CHEST 1 VIEW COMPARISON:  04/14/2012 FINDINGS: Heart size is accentuated by the portable AP position. There is minimal left lower lobe atelectasis. There are no focal consolidations or pleural effusions. No pulmonary edema. IMPRESSION: No evidence for acute   abnormality. Electronically Signed   By: Norva PavlovElizabeth  Brown M.D.   On: 11/02/2016 18:54    Cardiac Studies   TTE (preliminary review) shows EF of about 45%, diffuse hypokinesis, diastolic function difficult to determine based on technical aspects of study Patient Profile     66 y.o. male PMH of Asthma, CKD III, OSA, HTN, Paroxysmal atrial fibrillation(per pt [place], had another episode during a stress test 12-13 years ago), Nephrolithiasis stent placement 2016, new recent stones passed this week, comes to the ED because he felt his heart racing on Monday night after passing a kidney stone found to be in Atrial flutter, placed on cardizem drip.     Assessment & Plan    Aflutter: pt asymptomatic, only seems to have arrhythmias when he is under some sort of stress causing adrenaline surge.  States that he always recognizes when he's in an abnormal rhythm.  Pt states he gets an ECG every year and is normally in sinus rhythm.  On chart review no documented ecgs that show atrial fibrillation or atrial flutter other than the ecg from today.    -Will place pt on xarelto and diltiazem 360mg  and have him return Thursday at noon for TEE cardioversion -Will refer pt to EP specialist for further workup and to consider the possibility of ablation in the future.    OSA: pt compliant with CPAP and followed by sleep specialist.  -Counseled pt to continue to get re evaluated to make sure CPAP settings are appropriate and that it is functioning properly  HTN: important to continue good blood pressure control at home, it has been on the higher side since admission.  -pt on losartan 100mg  at home, follow up with PCP    Signed, Thornell MuleBrandon Winfrey, MD  11/03/2016, 2:14 PM    I have seen and examined the patient along with Thornell MuleBrandon Winfrey, MD .  I have reviewed the chart, notes and new data.  I agree with his note.  Key new complaints: he has palpitations that  he only notices when lying quietly in bed. He denies exertional dyspnea or angina Key examination changes: in atrial flutter with 4:1 AV block and regular rhythm, morbid obesity, otherwise normal cardiovascular exam. Normal JVD, no leg edema, no gallop, no pulmonary rales. Key new findings / data: ECG shows typical counterclockwise right atrial  flutter with 4:1 AV block. No older tracings show atrial fibrillation or atrial flutter, just frequent PACs. Preliminary review of echo shows normal size right ventricle and right atrium, mild dilated left atrium, moderate left ventricular hypertrophy with borderline low EF around 50%. Formal report pending  PLAN: Although he reports having history of "A. Fib" we have no documentation of this in the current arrhythmias clearly typical counterclockwise right atrial flutter. Embolic risk is increased: CHADSVasc 2 (HTN, LV dysfunction), but without previous history of TIA/stroke or embolic events. Best guess for arrhythmia also discussed roughly 72 hours ago.Recommend anticoagulation with a direct oral anticoagulant. Attempted to schedule for TEE-DC cardioversion tomorrow, but unfortunately no slots available until middle of next week. He is minimally symptomatic and it is very reasonable to let him go home on oral rate control medications and anticoagulation, to return for elective procedure next Thursday. Stopped enoxaparin and started Xarelto. Switched to oral diltiazem. We'll follow up in a.m. If rate remains well controlled on oral medications, okay for DC from our standpoint. Also recommend reevaluation of the CPAP prescription, since most likely underlying substrate for the atrial flutter is obstructive sleep apnea. Long term, he may benefit from atrial flutter cavotricuspid isthmus ablation so we'll refer TEP as outpatient. If successful and he does not return of atrial fibrillation, this would allow discontinuation of anticoagulants.  Thurmon Fair, MD,  [facility] [phone] 11/03/2016, 3:52 PM

## 2016-11-03 NOTE — Progress Notes (Signed)
ANTICOAGULATION CONSULT NOTE - Initial Consult  Pharmacy Consult for Xarelto Indication: atrial fibrillation  Allergies  Allergen Reactions  . Allopurinol Other (See Comments)    "Upset stomach"  . Aspirin     Upset stomach  . Prednisone Other (See Comments)    "Irritable"  . Rosuvastatin Other (See Comments)    Mental disturbance  . Simvastatin Other (See Comments)    Tired and achy  . Tuberculin Tests Other (See Comments)    Intense local reaction    Patient Measurements: Height: 6\' 3"  (190.5 cm) Weight: 257 lb 15 oz (117 kg) IBW/kg (Calculated) : 84.5  Vital Signs: Temp: 98.2 F (36.8 C) (08/30 1151) Temp Source: Oral (08/30 1151) BP: 161/95 (08/30 1151) Pulse Rate: 79 (08/30 0800)  Labs:  Recent Labs  11/02/16 1842 11/02/16 2329 11/02/16 2335 11/03/16 0714  HGB 12.3*  --  11.7*  --   HCT 38.3*  --  37.5*  --   PLT 237  --  217  --   LABPROT 12.8  --   --   --   INR 0.97  --   --   --   CREATININE 1.98*  --  1.67*  --   TROPONINI  --  <0.03 <0.03 <0.03    Estimated Creatinine Clearance: 59.2 mL/min (A) (by C-G formula based on SCr of 1.67 mg/dL (H)).  Assessment: 66 yo m presenting with heart palpations. Pharmacy consulted to start Xarelto for atrial fibrillation. Patient not on anticoagulation PTA. Was initially started on Lovenox and patient received last dose of Lovenox at 1219. SCr is trending down at 1.67 today, CrCl is borderline for dose adjustment.    Plan:  Discontinue Lovenox Start Xarelto 20 mg PO daily with evening meal (first dose will be scheduled later d/t Lovenox dose today) Monitor renal function, CBC,  and for signs/symptoms of bleeding   Thank you for allowing us to participate in this patients care.  Signe Coltonya C Kenlee Vogt, PharmD Clinical phone for 11/03/2016 from 3:3010:30p: [phone] If after 10:30p, please call main pharmacy at: [phone] 11/03/2016 3:51 PM

## 2016-11-03 NOTE — Progress Notes (Addendum)
PROGRESS NOTE    Darrell Perkins  ZOX:[id number] DOB: 02-Aug-1950 DOA: 11/02/2016 PCP: Marden NobleGates, Robert, MD   Specialists:      Brief Narrative:   [id number]  Known history of right ureteric stones status post cystoscopy stent placement Dr. Mena GoesEskridge 07/2014 Prior history of asthma, moderate persistent Chronic kidney disease stage III Obstructive sleep apnea on CPAP  [place] and 35 years ago developed some type of arrhythmia he is not sure what He moved here 25 years ago and has had episodic recurrences  Reports remote history of A. fib not on anticoagulation has been seen in the past for chest pain workup by Dr. Tenny Crawoss of cardiology Patient tells me that he had been diagnosed with "double pneumonia" sometime back and was getting an exercise stress test--he developed an arrhythmia was given a shot of medication and told to lie flat for 45 minutes and then was based on some medications  Reported to admitting physician history of passage of renal stones in addition recent 1.8 complex cyst left kidney, 7 mm nonobstructive renal stone  Came to the emergency room because of palpitations-heart rates noted 155, started on Cardizem gtt. Creatinine 1.9    Assessment & Plan:   Principal Problem:   Atrial fibrillation with RVR (HCC) Active Problems:   Nephrolithiasis   Essential hypertension   Chronic kidney disease, stage III (moderate)   OSA on CPAP   Leukopenia   Atrial flutter probably precipitated by pain from kidney stones [place] score >2  Have consulted cardiology, possible precipitant is habitus and sleep apnea which accesses substrate in terms of altered hemodynamics causing atrial enlargement  Probably will be difficult to control  Continue for now Cardizem at 15, keep on telemetry  Await echocardiogram   No labs ordered from this morning but note low magnesium of 1.7 and testing 3.6 which were replaced with IV mag 2 g and oral potassium  Obstructive sleep apnea on  BiPAP CPAP for 5 years Referred by Dr. Kevan NyGates to a sleep physician Unclear who this is an May need reevaluation as an outpatient  Continue AutoPap at night  Moderate persistent asthma  Continue inhalers watch heart rate while on albuterol  Chronic kidney disease stage III  Careful with diuretics and/or fluids--would hold Mobitz 15 mg every other day for now  Would be careful using Sudafed for allergies as this can precipitate tachycardia     Lovenox presumed full code Change to inpatient Will need 1-2 days monitoring at least to control heart rate   Consultants:  Cardiology  Procedures:  Echocardiogram pending  Antimicrobials:   None    Subjective: Sitting up pleasant in bed eating lunch no new issues  No chest pain Has passed multiple stones in the past week still has some left flank tenderness but no where as prior   Objective: Vitals:   11/02/16 2217 11/03/16 0034 11/03/16 0417 11/03/16 0800  BP: (!) 140/96  139/88 (!) 139/96  Pulse: 80 80 78 79  Resp: 14 18 14 17   Temp: 98.1 F (36.7 C)  97.8 F (36.6 C)   TempSrc: Oral  Axillary   SpO2: 99% 97% 98% 97%  Weight: 117.2 kg (258 lb 6.4 oz)  117 kg (257 lb 15 oz)   Height: 6\' 3"  (1.905 m)      No intake or output data in the 24 hours ending 11/03/16 0806 Filed Weights   11/02/16 1740 11/02/16 2217 11/03/16 0417  Weight: 119.7 kg (264 lb) 117.2  kg (258 lb 6.4 oz) 117 kg (257 lb 15 oz)    Examination:  Obese pleasant thick neck JVD not elevated Tachycardic sounds regular however possible murmur left lower sternal edge Abdomen obese nontender Mallampati 4 Neurologically intact Looks much younger and stated age No lymphadenopathy   Data Reviewed: I have personally reviewed following labs and imaging studies  CBC:  Recent Labs Lab 11/02/16 1842 11/02/16 2335  WBC 3.7* 3.5*  NEUTROABS 1.8  --   HGB 12.3* 11.7*  HCT 38.3* 37.5*  MCV 89.5 89.7  PLT 237 217   Basic Metabolic Panel:  Recent  Labs Lab 11/02/16 1842 11/02/16 2329 11/02/16 2335  NA 139  --  141  K 4.0  --  3.6  CL 107  --  110  CO2 24  --  24  GLUCOSE 107*  --  137*  BUN 19  --  17  CREATININE 1.98*  --  1.67*  CALCIUM 8.9  --  8.5*  MG  --  1.7  --    GFR: Estimated Creatinine Clearance: 59.2 mL/min (A) (by C-G formula based on SCr of 1.67 mg/dL (H)). Liver Function Tests: No results for input(s): AST, ALT, ALKPHOS, BILITOT, PROT, ALBUMIN in the last 168 hours. No results for input(s): LIPASE, AMYLASE in the last 168 hours. No results for input(s): AMMONIA in the last 168 hours. Coagulation Profile:  Recent Labs Lab 11/02/16 1842  INR 0.97   Cardiac Enzymes:  Recent Labs Lab 11/02/16 2329 11/02/16 2335  TROPONINI <0.03 <0.03   BNP (last 3 results) No results for input(s): PROBNP in the last 8760 hours. HbA1C: No results for input(s): HGBA1C in the last 72 hours. CBG: No results for input(s): GLUCAP in the last 168 hours. Lipid Profile: No results for input(s): CHOL, HDL, LDLCALC, TRIG, CHOLHDL, LDLDIRECT in the last 72 hours. Thyroid Function Tests:  Recent Labs  11/02/16 2329  TSH 1.494   Anemia Panel: No results for input(s): VITAMINB12, FOLATE, FERRITIN, TIBC, IRON, RETICCTPCT in the last 72 hours. Urine analysis:    Component Value Date/Time   COLORURINE YELLOW 07/01/2014 0813   APPEARANCEUR CLOUDY (A) 07/01/2014 0813   LABSPEC 1.017 07/01/2014 0813   PHURINE 6.0 07/01/2014 0813   GLUCOSEU NEGATIVE 07/01/2014 0813   HGBUR SMALL (A) 07/01/2014 0813   BILIRUBINUR NEGATIVE 07/01/2014 0813   KETONESUR NEGATIVE 07/01/2014 0813   PROTEINUR NEGATIVE 07/01/2014 0813   UROBILINOGEN 1.0 07/01/2014 0813   NITRITE NEGATIVE 07/01/2014 0813   LEUKOCYTESUR NEGATIVE 07/01/2014 0813     Radiology Studies: Reviewed images personally in health database    Scheduled Meds: . enoxaparin (LOVENOX) injection  1 mg/kg Subcutaneous Q12H  . losartan  100 mg Oral Daily  .  mometasone-formoterol  2 puff Inhalation BID   Continuous Infusions: . diltiazem (CARDIZEM) infusion 10 mg/hr (11/03/16 0101)     LOS: 0 days    Time spent: 27    Pleas Koch, MD [facility]   If 7PM-7AM, please contact night-coverage [email or web address] Password [email or web address] 11/03/2016, 8:06 AM

## 2016-11-03 NOTE — ED Provider Notes (Signed)
[facility]-EMERGENCY DEPT Provider Note   CSN: [id number] Arrival date & time: 11/02/16  1737     History   Chief Complaint Chief Complaint  Patient presents with  . Tachycardia    HPI JADER DESAI is a 66 y.o. male.hief complaint is palpitations.  HPI 66 year old male. Distant past history of paroxysmal A. Fib. Not on medication for over 10 years. For the last 2-3 days he is had intermittent episodes of palpitations. No chest pain. Some dyspnea. Primarily symptoms at night.Seen at urgent care today and noted to be in rapid A. Fib and referred here.  States that he had some abdominal pain over the weekend consistent with his previous history of ureteral colic. A symptomatically now. Feels lightheaded when standing. Feels palpitations when laying supine. Denies drug or alcohol use.  Past Medical History:  Diagnosis Date  . Bronchitis    hx of   . 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 Dysrhythmia   . GERD (gastroesophageal reflux disease)   . Irregular heartbeat   . Kidney stones   . Pneumonia    hx of   . Rapid atrial fibrillation (HCC) 11/26/2014  . Sleep apnea    USES CPAP    Patient Active Problem List   Diagnosis Date Noted  . Atrial fibrillation with RVR (HCC) 11/02/2016  . Allergic rhinitis 11/26/2014  . Cutaneous eruption 11/26/2014  . Uncomplicated asthma 11/26/2014  . Asthma, extrinsic, without status asthmaticus 11/26/2014  . Calculus of kidney 11/26/2014  . Cellulitis and abscess of leg 11/26/2014  . ED (erectile dysfunction) of organic origin 11/26/2014  . Benign essential HTN 11/26/2014  . Peripheral neuralgia 11/26/2014  . Cephalalgia 11/26/2014  . Tinea cruris 11/26/2014  . Folliculitis 11/26/2014  . Hypercholesterolemia without hypertriglyceridemia 11/26/2014  . Hematuria, microscopic 11/26/2014  . H/O adenomatous polyp of colon 11/26/2014  . Snoring 11/26/2014  . Difficulty staying awake 11/26/2014  . Essential (primary) hypertension  11/26/2014  . Combined fat and carbohydrate induced hyperlipemia 11/26/2014  . Tinea pedis 11/26/2014  . Erectile dysfunction due to arterial insufficiency 11/26/2014  . Perifolliculitis capitis abscedens 11/26/2014  . Other constipation 11/26/2014  . Chest pain 11/26/2014  . Other fatigue 11/26/2014  . Pain of right leg 11/26/2014  . Lesion of lateral popliteal nerve 11/26/2014  . Chronic kidney disease, stage III (moderate) 11/26/2014  . 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 Athlete's foot 11/26/2014  . Mixed hyperlipidemia 11/26/2014  . Adiposity 11/26/2014  . Polypharmacy 11/26/2014  . 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 Chest pain syndrome 11/26/2014  . Rapid atrial fibrillation (HCC) 11/26/2014  . Sleep apnea 11/26/2014  . Encounter for general adult medical examination without abnormal findings 09/02/2014    Past Surgical History:  Procedure Laterality Date  . colonscopy     . CYSTOSCOPY WITH RETROGRADE PYELOGRAM, URETEROSCOPY AND STENT PLACEMENT Right 08/01/2014   Procedure: CYSTOSCOPY WITH RIGHT  RETROGRADE PYELOGRAM/RIGHT URETEROSCOPY HOLMIUMN LASER LITOTRIPSY  STENT PLACEMENT ;  Surgeon: Jerilee Field, MD;  Location: [facility];  Service: Urology;  Laterality: Right;       Home Medications    Prior to Admission medications   Medication Sig Start Date End Date Taking? Authorizing Provider  acetaminophen (TYLENOL) 500 MG tablet Take 500 mg by mouth every 6 (six) hours as needed (pain).  06/20/14  Yes [provider]  albuterol (PROVENTIL HFA;VENTOLIN HFA) 108 (90 BASE) MCG/ACT inhaler Inhale 1-2 puffs into the lungs every 6 (six) hours as needed for wheezing. 04/14/12  Yes Sunnie Nielsen, MD  Bee Pollen 580 MG CAPS Take 580 mg by mouth as directed.   Yes [provider]  budesonide-formoterol (SYMBICORT) 160-4.5 MCG/ACT inhaler Inhale 2 puffs into the lungs 3 (three) times a week.   Yes [provider]  loratadine-pseudoephedrine  (CLARITIN-D 24-HOUR) 10-240 MG per 24 hr tablet Take 1 tablet by mouth daily as needed for allergies.   Yes [provider]  losartan (COZAAR) 100 MG tablet Take 100 mg by mouth daily.   Yes [provider]  meloxicam (MOBIC) 15 MG tablet Take 15 mg by mouth every other day. 09/01/16  Yes [provider]  mometasone (NASONEX) 50 MCG/ACT nasal spray Place 1 spray into both nostrils daily as needed (allergies).  08/21/12  Yes [provider]    Family History Family History  Problem Relation Age of Onset  . Hypertension Mother   . Diabetes Father   . Cerebrovascular Disease Father   . Alcohol abuse Father   . Hypertension Father   . COPD Father     Social History Social History  Substance Use Topics  . Smoking status: Never Smoker  . Smokeless tobacco: Never Used  . Alcohol use No     Allergies   Allopurinol; Aspirin; Prednisone; Rosuvastatin; Simvastatin; and Tuberculin tests   Review of Systems Review of Systems  Constitutional: Negative for appetite change, chills, diaphoresis, fatigue and fever.  HENT: Negative for mouth sores, sore throat and trouble swallowing.   Eyes: Negative for visual disturbance.  Respiratory: Negative for cough, chest tightness, shortness of breath and wheezing.   Cardiovascular: Positive for palpitations. Negative for chest pain.  Gastrointestinal: Negative for abdominal distention, abdominal pain, diarrhea, nausea and vomiting.  Endocrine: Negative for polydipsia, polyphagia and polyuria.  Genitourinary: Negative for dysuria, frequency and hematuria.  Musculoskeletal: Negative for gait problem.  Skin: Negative for color change, pallor and rash.  Neurological: Positive for dizziness and weakness. Negative for syncope, light-headedness and headaches.  Hematological: Does not bruise/bleed easily.  Psychiatric/Behavioral: Negative for behavioral problems and confusion.     Physical Exam Updated Vital Signs BP  (!) 140/96 (BP Location: Right Arm)   Pulse 80   Temp 98.1 F (36.7 C) (Oral)   Resp 14   Ht 6\' 3"  (1.905 m)   Wt 117.2 kg (258 lb 6.4 oz)   SpO2 99%   BMI 32.30 kg/m   Physical Exam  Constitutional: He is oriented to person, place, and time. He appears well-developed and well-nourished. No distress.  HENT:  Head: Normocephalic.  Eyes: Pupils are equal, round, and reactive to light. Conjunctivae are normal. No scleral icterus.  Neck: Normal range of motion. Neck supple. No thyromegaly present.  Cardiovascular: Exam reveals no gallop and no friction rub.   No murmur heard. Tachycardic. Neuro complex. Irregularly irregular. Psychiatric pattern consistent with atrial flutter and variable block. Rate between 120 to 150.  Pulmonary/Chest: Effort normal and breath sounds normal. No respiratory distress. He has no wheezes. He has no rales.  Clear lungs. No JVD. No gallop. No peripheral edema.  Abdominal: Soft. Bowel sounds are normal. He exhibits no distension. There is no tenderness. There is no rebound.  Musculoskeletal: Normal range of motion.  Neurological: He is alert and oriented to person, place, and time.  Moving all 4.  Skin: Skin is warm and dry. No rash noted.  Psychiatric: He has a normal mood and affect. His behavior is normal.     ED Treatments / Results  Labs (  all labs ordered are listed, but only abnormal results are displayed) Labs Reviewed  CBC WITH DIFFERENTIAL/PLATELET - Abnormal; Notable for the following:       Result Value   WBC 3.7 (*)    Hemoglobin 12.3 (*)    HCT 38.3 (*)    All other components within normal limits  BASIC METABOLIC PANEL - Abnormal; Notable for the following:    Glucose, Bld 107 (*)    Creatinine, Ser 1.98 (*)    GFR calc non Af Amer 33 (*)    GFR calc Af Amer 38 (*)    All other components within normal limits  PROTIME-INR  MAGNESIUM  TSH  CBC  BASIC METABOLIC PANEL  TROPONIN I  TROPONIN I  TROPONIN I  URINALYSIS, ROUTINE W  REFLEX MICROSCOPIC  I-STAT TROPONIN, ED    EKG  EKG Interpretation None       Radiology Dg Chest Port 1 View  Result Date: 11/02/2016 CLINICAL DATA:  Pt BIB [facility] from urgent care where pt went to be evaluated for "pounding in his chest" since Tuesday. Pt reports history of A Fib. Pt also reports passing 4 kidney stones in the last week. EXAM: PORTABLE CHEST 1 VIEW COMPARISON:  04/14/2012 FINDINGS: Heart size is accentuated by the portable AP position. There is minimal left lower lobe atelectasis. There are no focal consolidations or pleural effusions. No pulmonary edema. IMPRESSION: No evidence for acute  abnormality. Electronically Signed   By: Norva PavlovElizabeth  Brown M.D.   On: 11/02/2016 18:54    Procedures Procedures (including critical care time)  Medications Ordered in ED Medications  diltiazem (CARDIZEM) 1 mg/mL load via infusion 20 mg (20 mg Intravenous Bolus from Bag 11/02/16 1851)    And  diltiazem (CARDIZEM) 100 mg in dextrose 5 % 100 mL (1 mg/mL) infusion (10 mg/hr Intravenous Rate/Dose Change 11/02/16 1950)  mometasone-formoterol (DULERA) 200-5 MCG/ACT inhaler 2 puff (not administered)  acetaminophen (TYLENOL) tablet 500 mg (not administered)  fluticasone (FLONASE) 50 MCG/ACT nasal spray 1 spray (not administered)  ondansetron (ZOFRAN) tablet 4 mg (not administered)    Or  ondansetron (ZOFRAN) injection 4 mg (not administered)  enoxaparin (LOVENOX) injection 120 mg (not administered)  albuterol (PROVENTIL) (2.5 MG/3ML) 0.083% nebulizer solution 2.5 mg (not administered)  losartan (COZAAR) tablet 100 mg (not administered)  0.9 %  sodium chloride infusion ( Intravenous New Bag/Given 11/02/16 1843)     Initial Impression / Assessment and Plan / ED Course  I have reviewed the triage vital signs and the nursing notes.  Pertinent labs & imaging results that were available during my care of the patient were reviewed by me and considered in my medical decision making (see chart  for details).    Twelve-lead EKG confirms atrial flutter with variable block. Given Cardizem bolus. Started on drip. Titrated up. Rate control at 100 on drip at 10 mg per hour after bolus of 20. Remains retensive. Care discussed with hospitalist. Patient be admitted. Call placed to cardiology regardingconsultation.  Final Clinical Impressions(s) / ED Diagnoses   Final diagnoses:  Typical atrial flutter [facility](HCC)    New Prescriptions Current Discharge Medication List       Rolland PorterJames, Alexsandra Shontz, MD 11/03/16 [phone]

## 2016-11-03 NOTE — Care Management Note (Addendum)
Case Management Note  Patient Details  Name: Darrell Perkins MRN: [id number] Date of Birth: 1950/11/05  Subjective/Objective: Pt presented for Atrial Fib RVR- PTA independent and plans to return home. Benefits Check in Process for Xarelto.                   Action/Plan: CM will make pt aware of cost once completed and provide pt with 30 day free card. No further needs from CM at this time.   Expected Discharge Date:                  Expected Discharge Plan:  Home/Self Care  In-House Referral:  NA  Discharge planning Services  CM Consult, Medication Assistance  Post Acute Care Choice:  NA Choice offered to:  NA  DME Arranged:  N/A DME Agency:  NA  HH Arranged:  NA HH Agency:  NA  Status of Service:  Completed, signed off  If discussed at Long Length of Stay Meetings, dates discussed:    Additional Comments: 1028 11-04-16 Darrell BambergerBrenda Graves-Bigelow, RN,BSN [phone] 30 day free card provided to patient. [facility] [address] has medication available. No further needs from CM at this time.    1. ELIQUIS  2.50 MG BID  COVER- YES  CO-PAY- $ 18.88  PRIOR APPROVAL NO   2. ELIQUIS 5 MG BID  COVER- YES  CO-PAY- $ 18.00  PRIOR APPROVAL- NO   3. XARELTO 20 MG BID  COVER- YES  CO-PAY- $ 18.88  PRIOR APPROVAL- NO   PREFERRED PHARMACY : [facility]  Darrell Perkins, Darrell Offield [name], RN 11/03/2016, 4:06 PM

## 2016-11-04 DIAGNOSIS — I4891 Unspecified atrial fibrillation: Secondary | ICD-10-CM | POA: Diagnosis not present

## 2016-11-04 LAB — CBC WITH DIFFERENTIAL/PLATELET
Basophils Absolute: 0 10*3/uL (ref 0.0–0.1)
Basophils Relative: 1 %
Eosinophils Absolute: 0.1 10*3/uL (ref 0.0–0.7)
Eosinophils Relative: 3 %
HEMATOCRIT: 39.5 % (ref 39.0–52.0)
Hemoglobin: 12.6 g/dL — ABNORMAL LOW (ref 13.0–17.0)
LYMPHS PCT: 36 %
Lymphs Abs: 1.3 10*3/uL (ref 0.7–4.0)
MCH: 28.3 pg (ref 26.0–34.0)
MCHC: 31.9 g/dL (ref 30.0–36.0)
MCV: 88.8 fL (ref 78.0–100.0)
MONO ABS: 0.3 10*3/uL (ref 0.1–1.0)
Monocytes Relative: 7 %
NEUTROS ABS: 1.9 10*3/uL (ref 1.7–7.7)
Neutrophils Relative %: 53 %
Platelets: 241 10*3/uL (ref 150–400)
RBC: 4.45 MIL/uL (ref 4.22–5.81)
RDW: 13.5 % (ref 11.5–15.5)
WBC: 3.6 10*3/uL — ABNORMAL LOW (ref 4.0–10.5)

## 2016-11-04 LAB — COMPREHENSIVE METABOLIC PANEL
ALT: 14 U/L — ABNORMAL LOW (ref 17–63)
ANION GAP: 8 (ref 5–15)
AST: 16 U/L (ref 15–41)
Albumin: 3.1 g/dL — ABNORMAL LOW (ref 3.5–5.0)
Alkaline Phosphatase: 41 U/L (ref 38–126)
BILIRUBIN TOTAL: 0.6 mg/dL (ref 0.3–1.2)
BUN: 14 mg/dL (ref 6–20)
CO2: 25 mmol/L (ref 22–32)
Calcium: 8.7 mg/dL — ABNORMAL LOW (ref 8.9–10.3)
Chloride: 105 mmol/L (ref 101–111)
Creatinine, Ser: 1.64 mg/dL — ABNORMAL HIGH (ref 0.61–1.24)
GFR, EST AFRICAN AMERICAN: 48 mL/min — AB (ref >60)
GFR, EST NON AFRICAN AMERICAN: 42 mL/min — AB (ref >60)
Glucose, Bld: 116 mg/dL — ABNORMAL HIGH (ref 65–99)
POTASSIUM: 3.8 mmol/L (ref 3.5–5.1)
Sodium: 138 mmol/L (ref 135–145)
TOTAL PROTEIN: 6.5 g/dL (ref 6.5–8.1)

## 2016-11-04 LAB — MAGNESIUM: MAGNESIUM: 2 mg/dL (ref 1.7–2.4)

## 2016-11-04 LAB — PROTIME-INR
INR: 1.93
PROTHROMBIN TIME: 21.9 s — AB (ref 11.4–15.2)

## 2016-11-04 MED ORDER — METOPROLOL TARTRATE 25 MG PO TABS
25.0000 mg | ORAL_TABLET | Freq: Two times a day (BID) | ORAL | 1 refills | Status: DC
Start: 2016-11-04 — End: 2016-11-10

## 2016-11-04 MED ORDER — POLYETHYLENE GLYCOL 3350 17 G PO PACK: 17.0000 g | PACK | Freq: Every day | ORAL | 0 refills | Status: DC

## 2016-11-04 MED ORDER — METOPROLOL TARTRATE 25 MG PO TABS
25.0000 mg | ORAL_TABLET | Freq: Two times a day (BID) | ORAL | Status: DC
  Administered 2016-11-04: 25 mg via ORAL
  Filled 2016-11-04: qty 1

## 2016-11-04 MED ORDER — DILTIAZEM HCL ER COATED BEADS 360 MG PO CP24: 360.0000 mg | ORAL_CAPSULE | Freq: Every day | ORAL | 1 refills | Status: DC

## 2016-11-04 MED ORDER — SENNOSIDES-DOCUSATE SODIUM 8.6-50 MG PO TABS: 1.0000 | ORAL_TABLET | Freq: Every evening | ORAL | 0 refills | Status: DC | PRN

## 2016-11-04 MED ORDER — RIVAROXABAN 15 MG PO TABS: 15.0000 mg | ORAL_TABLET | Freq: Every day | ORAL | 0 refills | Status: DC

## 2016-11-04 MED ORDER — POLYETHYLENE GLYCOL 3350 17 G PO PACK
17.0000 g | PACK | Freq: Every day | ORAL | Status: DC
  Administered 2016-11-04: 17 g via ORAL
  Filled 2016-11-04: qty 1

## 2016-11-04 NOTE — Discharge Summary (Signed)
Physician Discharge Summary  Darrell Perkins:[id number] DOB: April 28, 1950 DOA: 11/02/2016  PCP: [name], MD  Admit date: 11/02/2016 Discharge date: 11/04/2016  Time spent: 35 minutes  Recommendations for Outpatient Follow-up:  1. Patient will need subspecialty EP follow-up with [name] next Thursday 2. New Medications-Cardizem 360 CD, Toprol 25 bID 3. Patient will also need Xarelto ongoing to be determined as per cardiologist 4. Labs 1 week  Discharge Diagnoses:  Principal Problem:   Atrial fibrillation with RVR (HCC) Active Problems:   Nephrolithiasis   Essential hypertension   Chronic kidney disease, stage III (moderate)   OSA on CPAP   Leukopenia   Discharge Condition: Fair  Diet recommendation: Heart healthy  Filed Weights   11/02/16 2217 11/03/16 0417 11/04/16 0416  Weight: 117.2 kg (258 lb 6.4 oz) 117 kg (257 lb 15 oz) 118.7 kg (261 lb 11 oz)    History of present illness:   7  Known history of right ureteric stones status post cystoscopy stent placement [name] 07/2014 Prior history of asthma, moderate persistent Chronic kidney disease stage III Obstructive sleep apnea on CPAP  He is originally from [place] and 35 years ago developed some type of arrhythmia he is not sure what He moved here 25 years ago and has had episodic recurrences  Reports remote history of A. fib not on anticoagulation has been seen in the past for chest pain workup by [name] of cardiology Patient tells me that he had been diagnosed with "double pneumonia" sometime back and was getting an exercise stress test--he developed an arrhythmia was given a shot of medication and told to lie flat for 45 minutes and then was based on some medications  Reported to admitting physician history of passage of renal stones in addition recent 1.8 complex cyst left kidney, 7 mm nonobstructive renal stone  Came to the emergency room because of palpitations-heart rates noted 155, started on  Cardizem gtt. Creatinine 1.9   Hospital Course:   Atrial flutter probably precipitated by pain from kidney stones [place] score >2             Have consulted cardiology, possible precipitant is habitus and sleep apnea which accesses substrate in terms of altered hemodynamics causing atrial enlargement             Probably will be difficult to control             Continue for now Cardizem at 15--->360 CD, added Metoprolol 25             echocardiogram EF 40-45%                         mage 2 and K ok on d/c  Patient was cleared by cardiology for discharge on 8/31  Obstructive sleep apnea on BiPAP CPAP for 5 years Referred by [name] to a sleep physician--? [name] Needs reevaluation as an outpatient  Moderate persistent asthma             Continue inhalers watch heart rate while on albuterol  Chronic kidney disease stage III             Careful with diuretics and/or fluids--would hold Mobitz 15 mg every other day for now--may resume as an outpatient  Would be careful using Sudafed for allergies as this can precipitate tachycardia  Procedures: Echocardiogram  This admission Study Conclusions  - Left ventricle: The cavity size was normal. There was moderate  concentric hypertrophy. Systolic function was mildly to   moderately reduced. The estimated ejection fraction was in the   range of 40% to 45%. Diffuse hypokinesis. The study is not   technically sufficient to allow evaluation of LV diastolic   function. - Aortic valve: Transvalvular velocity was within the normal range.   There was no stenosis. There was no regurgitation. - Mitral valve: Transvalvular velocity was within the normal range.   There was no evidence for stenosis. There was mild regurgitation. - Right ventricle: The cavity size was normal. Wall thickness was   normal. Systolic function was normal. - Atrial septum: No defect or patent foramen ovale was identified   by color flow Doppler. - Tricuspid  valve: There was no regurgitation.   Consultations:  Cardiology  Discharge Exam: Vitals:   11/04/16 0810 11/04/16 1100  BP: (!) 144/100   Pulse:  82  Resp:    Temp:    SpO2:      General: Pleasant alert walking around the room doing fair Thick neck Mallampati 4 No pallor no icterus Cardiovascular: S1-S2 regularly irregular Respiratory: Clinically clear No lower extremity edema  Discharge Instructions   Discharge Instructions    Diet - low sodium heart healthy    Complete by:  As directed    Discharge instructions    Complete by:  As directed    You were diagnosed with an irregular heart beat this admission and will need close follow-up with cardiology for this We started you on 2 new medications to help control this and I would recommend following with the cardiologist recommendation of resting until you get seen for consideration of the procedure to change your heart rhythm You will probably need to follow-up with [name] who did do a sleep study or [name] who referred you to [name] and have your CPAP machine looked at If you have any further problems please call cardiology office   Increase activity slowly    Complete by:  As directed      Current Discharge Medication List    START taking these medications   Details  diltiazem (CARDIZEM CD) 360 MG 24 hr capsule Take 1 capsule (360 mg total) by mouth daily. Qty: 30 capsule, Refills: 1    metoprolol tartrate (LOPRESSOR) 25 MG tablet Take 1 tablet (25 mg total) by mouth 2 (two) times daily. Qty: 30 tablet, Refills: 1    polyethylene glycol (MIRALAX / GLYCOLAX) packet Take 17 g by mouth daily. Qty: 14 each, Refills: 0    senna-docusate (SENOKOT-S) 8.6-50 MG tablet Take 1-2 tablets by mouth at bedtime as needed for mild constipation. Qty: 20 tablet, Refills: 0      CONTINUE these medications which have NOT CHANGED   Details  acetaminophen (TYLENOL) 500 MG tablet Take 500 mg by mouth every 6 (six)  hours as needed (pain).     albuterol (PROVENTIL HFA;VENTOLIN HFA) 108 (90 BASE) MCG/ACT inhaler Inhale 1-2 puffs into the lungs every 6 (six) hours as needed for wheezing. Qty: 1 Inhaler, Refills: 0    Bee Pollen 580 MG CAPS Take 580 mg by mouth as directed.    budesonide-formoterol (SYMBICORT) 160-4.5 MCG/ACT inhaler Inhale 2 puffs into the lungs 3 (three) times a week.    meloxicam (MOBIC) 15 MG tablet Take 15 mg by mouth every other day. Refills: 1    mometasone (NASONEX) 50 MCG/ACT nasal spray Place 1 spray into both nostrils daily as needed (allergies).  STOP taking these medications     loratadine-pseudoephedrine (CLARITIN-D 24-HOUR) 10-240 MG per 24 hr tablet      losartan (COZAAR) 100 MG tablet        Allergies  Allergen Reactions  . Allopurinol Other (See Comments)    "Upset stomach"  . Aspirin     Upset stomach  . Prednisone Other (See Comments)    "Irritable"  . Rosuvastatin Other (See Comments)    Mental disturbance  . Simvastatin Other (See Comments)    Tired and achy  . Tuberculin Tests Other (See Comments)    Intense local reaction      The results of significant diagnostics from this hospitalization (including imaging, microbiology, ancillary and laboratory) are listed below for reference.    Significant Diagnostic Studies: US Renal  Result Date: 10/17/2016 CLINICAL DATA:  Microscopic hematuria . EXAM: RENAL / URINARY TRACT ULTRASOUND COMPLETE COMPARISON:  Ultrasound 10/28/2014.  CT 07/01/2014 FINDINGS: Right Kidney: Length: 13.3 cm. Echogenicity within normal limits. No mass or hydronephrosis visualized. 7 mm nonobstructing stone. Left Kidney: Length: 12.3 cm. Echogenicity within normal limits. No hydronephrosis visualized. 1.8 x 1.1 x 0.8 cm complex cyst with thin septation and slightly irregular margins. Renal tumor cannot be excluded. Gadolinium-enhanced MRI suggested for further evaluation. Bladder: A echogenic mass with small echogenic focus  possibly calcification was incidentally noted along the posterior wall of the bladder. A focal bladder tumor cannot be excluded. Debris in the bladder cannot be excluded. IMPRESSION: 1. A small echogenic mass along the posterior bladder wall cannot be excluded. Cystoscopic evaluation should be considered for further evaluation as bladder malignancy cannot be excluded. 2. 1.8 cm complex cyst left kidney. Gadolinium-enhanced MRI of the kidneys should be considered for further evaluation. 3.  7 mm nonobstructing right renal stone. Electronically Signed   By: Maisie Fus  Register   On: 10/17/2016 16:56   Dg Chest Port 1 View  Result Date: 11/02/2016 CLINICAL DATA:  Pt BIB [facility] from urgent care where pt went to be evaluated for "pounding in his chest" since Tuesday. Pt reports history of A Fib. Pt also reports passing 4 kidney stones in the last week. EXAM: PORTABLE CHEST 1 VIEW COMPARISON:  04/14/2012 FINDINGS: Heart size is accentuated by the portable AP position. There is minimal left lower lobe atelectasis. There are no focal consolidations or pleural effusions. No pulmonary edema. IMPRESSION: No evidence for acute  abnormality. Electronically Signed   By: Norva Pavlov M.D.   On: 11/02/2016 18:54    Microbiology: No results found for this or any previous visit (from the past 240 hour(s)).   Labs: Basic Metabolic Panel:  Recent Labs Lab 11/02/16 1842 11/02/16 2329 11/02/16 2335 11/04/16 0258  NA 139  --  141 138  K 4.0  --  3.6 3.8  CL 107  --  110 105  CO2 24  --  24 25  GLUCOSE 107*  --  137* 116*  BUN 19  --  17 14  CREATININE 1.98*  --  1.67* 1.64*  CALCIUM 8.9  --  8.5* 8.7*  MG  --  1.7  --  2.0   Liver Function Tests:  Recent Labs Lab 11/04/16 0258  AST 16  ALT 14*  ALKPHOS 41  BILITOT 0.6  PROT 6.5  ALBUMIN 3.1*   No results for input(s): LIPASE, AMYLASE in the last 168 hours. No results for input(s): AMMONIA in the last 168 hours. CBC:  Recent Labs Lab  11/02/16 1842 11/02/16 2335 11/04/16  0258  WBC 3.7* 3.5* 3.6*  NEUTROABS 1.8  --  1.9  HGB 12.3* 11.7* 12.6*  HCT 38.3* 37.5* 39.5  MCV 89.5 89.7 88.8  PLT 237 217 241   Cardiac Enzymes:  Recent Labs Lab 11/02/16 2329 11/02/16 2335 11/03/16 0714  TROPONINI <0.03 <0.03 <0.03   BNP: BNP (last 3 results) No results for input(s): BNP in the last 8760 hours.  ProBNP (last 3 results) No results for input(s): PROBNP in the last 8760 hours.  CBG: No results for input(s): GLUCAP in the last 168 hours.     SignedRhetta Mura:  Markeis Allman, JAI-GURMUKH MD   [facility] 11/04/2016, 11:18 AM

## 2016-11-04 NOTE — Progress Notes (Signed)
Progress Note  Patient Name: Darrell Perkins Date of Encounter: 11/04/2016  Primary Cardiologist: Tenny Craw  Subjective   Feels well. HR 75 most of the time, jumps to 150 with any activity.Not really aware of the arrhythmia.  Inpatient Medications    Scheduled Meds: . diltiazem  360 mg Oral Daily  . metoprolol tartrate  25 mg Oral BID  . mometasone-formoterol  2 puff Inhalation BID  . polyethylene glycol  17 g Oral Daily  . potassium chloride  40 mEq Oral Daily  . rivaroxaban  20 mg Oral Q supper   Continuous Infusions:  PRN Meds: acetaminophen, albuterol, fluticasone, ondansetron **OR** ondansetron (ZOFRAN) IV, senna-docusate   Vital Signs    Vitals:   11/04/16 0007 11/04/16 0416 11/04/16 0800 11/04/16 0810  BP: (!) 141/85 132/83  (!) 144/100  Pulse: 78 76 64   Resp: 17 18 18    Temp: 98.1 F (36.7 C) 98.3 F (36.8 C) 98.2 F (36.8 C)   TempSrc: Oral Oral Oral   SpO2: 99% 100% 100%   Weight:  261 lb 11 oz (118.7 kg)    Height:        Intake/Output Summary (Last 24 hours) at 11/04/16 0850 Last data filed at 11/04/16 0416  Gross per 24 hour  Intake             1840 ml  Output              600 ml  Net             1240 ml   Filed Weights   11/02/16 2217 11/03/16 0417 11/04/16 0416  Weight: 258 lb 6.4 oz (117.2 kg) 257 lb 15 oz (117 kg) 261 lb 11 oz (118.7 kg)    Telemetry    AFlutter, mostly 4:1 AV block, 2:1 AV block with activity - Personally Reviewed  ECG    No new tracing - Personally Reviewed  Physical Exam  Obese, alert, comfortable GEN: No acute distress.   Neck: No JVD Cardiac: RRR, no murmurs, rubs, or gallops.  Respiratory: Clear to auscultation bilaterally. GI: Soft, nontender, non-distended  MS: No edema; No deformity. Neuro:  Nonfocal  Psych: Normal affect   Labs    Chemistry Recent Labs Lab 11/02/16 1842 11/02/16 2335 11/04/16 0258  NA 139 141 138  K 4.0 3.6 3.8  CL 107 110 105  CO2 24 24 25   GLUCOSE 107* 137* 116*  BUN 19 17  14   CREATININE 1.98* 1.67* 1.64*  CALCIUM 8.9 8.5* 8.7*  PROT  --   --  6.5  ALBUMIN  --   --  3.1*  AST  --   --  16  ALT  --   --  14*  ALKPHOS  --   --  41  BILITOT  --   --  0.6  GFRNONAA 33* 41* 42*  GFRAA 38* 47* 48*  ANIONGAP 8 7 8      Hematology Recent Labs Lab 11/02/16 1842 11/02/16 2335 11/04/16 0258  WBC 3.7* 3.5* 3.6*  RBC 4.28 4.18* 4.45  HGB 12.3* 11.7* 12.6*  HCT 38.3* 37.5* 39.5  MCV 89.5 89.7 88.8  MCH 28.7 28.0 28.3  MCHC 32.1 31.2 31.9  RDW 13.6 13.8 13.5  PLT 237 217 241    Cardiac Enzymes Recent Labs Lab 11/02/16 2329 11/02/16 2335 11/03/16 0714  TROPONINI <0.03 <0.03 <0.03    Recent Labs Lab 11/02/16 1856  TROPIPOC 0.00     BNPNo results for input(s):  BNP, PROBNP in the last 168 hours.   DDimer No results for input(s): DDIMER in the last 168 hours.   Radiology    Dg Chest Port 1 View  Result Date: 11/02/2016 CLINICAL DATA:  Pt BIB [facility] from urgent care where pt went to be evaluated for "pounding in his chest" since Tuesday. Pt reports history of A Fib. Pt also reports passing 4 kidney stones in the last week. EXAM: PORTABLE CHEST 1 VIEW COMPARISON:  04/14/2012 FINDINGS: Heart size is accentuated by the portable AP position. There is minimal left lower lobe atelectasis. There are no focal consolidations or pleural effusions. No pulmonary edema. IMPRESSION: No evidence for acute  abnormality. Electronically Signed   By: Norva PavlovElizabeth  Brown M.D.   On: 11/02/2016 18:54    Cardiac Studies  Echo 11/03/2016 - Left ventricle: The cavity size was normal. There was moderate concentric hypertrophy. Systolic function was mildly to   moderately reduced. The estimated ejection fraction was in the range of 40% to 45%. Diffuse hypokinesis. The study is not   technically sufficient to allow evaluation of LV diastolic function. - Aortic valve: Transvalvular velocity was within the normal range. There was no stenosis. There was no regurgitation. - Mitral  valve: Transvalvular velocity was within the normal range. There was no evidence for stenosis. There was mild regurgitation. - Right ventricle: The cavity size was normal. Wall thickness was normal. Systolic function was normal. - Atrial septum: No defect or patent foramen ovale was identified by color flow Doppler. - Tricuspid valve: There was no regurgitation.  Patient Profile     66 y.o. male with mildly symptomatic atrial flutter (typical counterclockwise), hypertrophic (hypertensive) cardiomyopathy with mildly reduced LVEF (49% by previous nuclear study, 40-45% by current echo), without overt CHF, OSA, obesity  Assessment & Plan    1. AFlutter: rate control expected to be very challenging, plan TEE-cardioversion on first available date (Thursday 11/10/2016, noon). OK to add low dose beta blocker in the meantime. Expect will still be RVR with activity, so I recommend that he avoid physical exertion in the meantime. Long term best solution is RF cavotricuspid isthmus ablation - briefly discussed with EP Dr. Johney FrameAllred who agrees w RFA in principle. On DOAC. 2. HTN: fair control, will tolerate additional beta blocker. 3. OSA: follow up w his sleep specialist to make sure CPAP Rx is optimized. 4. Obesity:  Strongly recommend weight loss to avoid future problems with arrhythmia and HF. 5. LV dysfunction: partly a chronic abnormality, , may have a component of tachycardia CMP; reevaluate EF once in normal rhythm for a few weeks.   Signed, Thurmon FairMihai Hager [place]ston, MD  11/04/2016, 8:50 AM

## 2016-11-04 NOTE — Progress Notes (Signed)
Throughout night, pt HR sustained 70s-90s Aflutter while at rest, but with any exertion HR increased to as high as 145. Will convey to Day RN and continue to monitor.

## 2016-11-04 NOTE — Progress Notes (Signed)
PT heart rate went from 82 at rest to 145-150 bpm while ambulating.

## 2016-11-09 ENCOUNTER — Encounter (HOSPITAL_COMMUNITY): Payer: Self-pay | Admitting: Certified Registered Nurse Anesthetist

## 2016-11-09 ENCOUNTER — Telehealth: Payer: Self-pay | Admitting: Cardiovascular Disease

## 2016-11-09 NOTE — Telephone Encounter (Signed)
Patient brought Attending Physicians Statement Forms Robbi Garter(Wilbert) for Dr Royann Shiversroitoru to fill out and sign after procedure on 11/10/16.  [facility] Royann Shiversroitoru.  lp

## 2016-11-10 ENCOUNTER — Ambulatory Visit (HOSPITAL_COMMUNITY)
Admission: RE | Admit: 2016-11-10 | Discharge: 2016-11-10 | Disposition: A | Discharge status: Home / Self Care | Payer: No Typology Code available for payment source | Source: Ambulatory Visit | Attending: Cardiovascular Disease | Admitting: Cardiovascular Disease

## 2016-11-10 ENCOUNTER — Ambulatory Visit (HOSPITAL_COMMUNITY): Payer: No Typology Code available for payment source

## 2016-11-10 ENCOUNTER — Encounter (HOSPITAL_COMMUNITY)
Admission: RE | Disposition: A | Discharge status: Home / Self Care | Payer: Self-pay | Source: Ambulatory Visit | Attending: Cardiovascular Disease

## 2016-11-10 DIAGNOSIS — Z87442 Personal history of urinary calculi: Secondary | ICD-10-CM | POA: Insufficient documentation

## 2016-11-10 DIAGNOSIS — I129 Hypertensive chronic kidney disease with stage 1 through stage 4 chronic kidney disease, or unspecified chronic kidney disease: Secondary | ICD-10-CM | POA: Insufficient documentation

## 2016-11-10 DIAGNOSIS — J45909 Unspecified asthma, uncomplicated: Secondary | ICD-10-CM | POA: Insufficient documentation

## 2016-11-10 DIAGNOSIS — I4892 Unspecified atrial flutter: Secondary | ICD-10-CM | POA: Diagnosis not present

## 2016-11-10 DIAGNOSIS — N183 Chronic kidney disease, stage 3 (moderate): Secondary | ICD-10-CM | POA: Insufficient documentation

## 2016-11-10 DIAGNOSIS — I480 Paroxysmal atrial fibrillation: Secondary | ICD-10-CM | POA: Insufficient documentation

## 2016-11-10 DIAGNOSIS — Z538 Procedure and treatment not carried out for other reasons: Secondary | ICD-10-CM | POA: Insufficient documentation

## 2016-11-10 DIAGNOSIS — N200 Calculus of kidney: Secondary | ICD-10-CM | POA: Insufficient documentation

## 2016-11-10 DIAGNOSIS — G4733 Obstructive sleep apnea (adult) (pediatric): Secondary | ICD-10-CM | POA: Diagnosis not present

## 2016-11-10 SURGERY — CANCELLED PROCEDURE

## 2016-11-10 MED ORDER — METOPROLOL TARTRATE 25 MG PO TABS: 12.5000 mg | ORAL_TABLET | Freq: Two times a day (BID) | ORAL | 1 refills | Status: DC

## 2016-11-10 NOTE — Progress Notes (Signed)
Presented today in normal rhythm (moderate sinus bradycardia with occasional PACs). Feeling better over the last 2-3 days, sleeping more comfortably. Interested in coming off the anticoagulants. Will refer to EP to discuss appropriateness of cavotricuspid isthmus ablation. Stay on DOAC and diltiazem, but will cut back the dose of the beta blocker to 12.5 mg twice daily.  Thurmon FairMihai Prerna Harold, MD, [facility] [phone] office [phone] pager

## 2016-11-10 NOTE — Progress Notes (Signed)
Patient arrived to endoscopy for outpatient TEE/cardioversion today. Patient was in sinus bradycardia on the monitor with HR 50's. Dr. Royann Shiversroitoru spoke with patient, 12 lead EKG obtained which revealed same. Dr. Royann Shiversroitoru discussed follow up plan and medication plan with patient prior to discharge.

## 2016-11-10 NOTE — H&P (View-Only) (Signed)
Consult Note  Patient Name: Darrell Perkins Date of Encounter: 11/03/2016  Primary Cardiologist: Dietrich Pates  Subjective   Pt is a 66 yo male with PMH of Asthma, CKD III, OSA, HTN, Paroxysmal atrial fibrillation(not documented but per pt first episode occurred 25 years ago in [place] he thinks, had another episode during a stress test 12-13 years ago), Nephrolithiasis stent placement 2016, new recent stones passed this week, comes to the ED because he felt his heart racing on Monday night after passing a kidney stone.  When it continued he decided to come in and get checked out.  He denies any shortness of breath, chest pain, does report some increased sweating.  In the ED the pt was found to have a rate of 150 reported to be in afib w/RVR but it's possible pt was in Aflutter with variable block ecg result not in system. He was placed on a cardizem drip and has remained rate controlled since then and is still asymptomatic.  His most recent ecg shows patient is in counterclockwise Aflutter with 4:1 block   Inpatient Medications    Scheduled Meds: . enoxaparin (LOVENOX) injection  120 mg Subcutaneous Q12H  . losartan  100 mg Oral Daily  . mometasone-formoterol  2 puff Inhalation BID  . potassium chloride  40 mEq Oral Daily   Continuous Infusions: . diltiazem (CARDIZEM) infusion 10 mg/hr (11/03/16 1223)  . magnesium sulfate 1 - 4 g bolus IVPB     PRN Meds: acetaminophen, albuterol, fluticasone, ondansetron **OR** ondansetron (ZOFRAN) IV   Vital Signs    Vitals:   11/03/16 0417 11/03/16 0800 11/03/16 0852 11/03/16 1151  BP: 139/88 (!) 139/96 (!) 144/97 (!) 161/95  Pulse: 78 79    Resp: 14 17    Temp: 97.8 F (36.6 C)  98.1 F (36.7 C) 98.2 F (36.8 C)  TempSrc: Axillary  Oral Oral  SpO2: 98% 97% 98% 100%  Weight: 117 kg (257 lb 15 oz)     Height:        Intake/Output Summary (Last 24 hours) at 11/03/16 1414 Last data filed at 11/03/16 1100  Gross per 24 hour  Intake                 0 ml  Output              600 ml  Net             -600 ml   Filed Weights   11/02/16 1740 11/02/16 2217 11/03/16 0417  Weight: 119.7 kg (264 lb) 117.2 kg (258 lb 6.4 oz) 117 kg (257 lb 15 oz)    Telemetry    Atrial flutter with 4:1 block - Personally Reviewed  ECG    Atrial flutter with 4:1 block, normal axis, no ST changes - Personally Reviewed  Physical Exam   GEN: No acute distress.   Neck: No JVD Cardiac: RRR, no murmurs, rubs, or gallops.  Respiratory: Clear to auscultation bilaterally. GI: Soft, nontender, non-distended  MS: No edema; No deformity. Neuro:  Nonfocal  Psych: Normal affect   Labs    Chemistry Recent Labs Lab 11/02/16 1842 11/02/16 2335  NA 139 141  K 4.0 3.6  CL 107 110  CO2 24 24  GLUCOSE 107* 137*  BUN 19 17  CREATININE 1.98* 1.67*  CALCIUM 8.9 8.5*  GFRNONAA 33* 41*  GFRAA 38* 47*  ANIONGAP 8 7     Hematology Recent Labs Lab 11/02/16 1842 11/02/16 2335  WBC 3.7* 3.5*  RBC 4.28 4.18*  HGB 12.3* 11.7*  HCT 38.3* 37.5*  MCV 89.5 89.7  MCH 28.7 28.0  MCHC 32.1 31.2  RDW 13.6 13.8  PLT 237 217    Cardiac Enzymes Recent Labs Lab 11/02/16 2329 11/02/16 2335 11/03/16 0714  TROPONINI <0.03 <0.03 <0.03    Recent Labs Lab 11/02/16 1856  TROPIPOC 0.00     BNPNo results for input(s): BNP, PROBNP in the last 168 hours.   DDimer No results for input(s): DDIMER in the last 168 hours.   Radiology    Dg Chest Port 1 View  Result Date: 11/02/2016 CLINICAL DATA:  Pt BIB [facility] from urgent care where pt went to be evaluated for "pounding in his chest" since Tuesday. Pt reports history of A Fib. Pt also reports passing 4 kidney stones in the last week. EXAM: PORTABLE CHEST 1 VIEW COMPARISON:  04/14/2012 FINDINGS: Heart size is accentuated by the portable AP position. There is minimal left lower lobe atelectasis. There are no focal consolidations or pleural effusions. No pulmonary edema. IMPRESSION: No evidence for acute   abnormality. Electronically Signed   By: Norva PavlovElizabeth  Brown M.D.   On: 11/02/2016 18:54    Cardiac Studies   TTE (preliminary review) shows EF of about 45%, diffuse hypokinesis, diastolic function difficult to determine based on technical aspects of study Patient Profile     66 y.o. male PMH of Asthma, CKD III, OSA, HTN, Paroxysmal atrial fibrillation(per pt first episode occurred 25 [place] ago [place] he thinks, had another episode during a stress test 12-13 years ago), Nephrolithiasis stent placement 2016, new recent stones passed this week, comes to the ED because he felt his heart racing on Monday night after passing a kidney stone found to be in Atrial flutter, placed on cardizem drip.     Assessment & Plan    Aflutter: pt asymptomatic, only seems to have arrhythmias when he is under some sort of stress causing adrenaline surge.  States that he always recognizes when he's in an abnormal rhythm.  Pt states he gets an ECG every year and is normally in sinus rhythm.  On chart review no documented ecgs that show atrial fibrillation or atrial flutter other than the ecg from today.    -Will place pt on xarelto and diltiazem 360mg  and have him return Thursday at noon for TEE cardioversion -Will refer pt to EP specialist for further workup and to consider the possibility of ablation in the future.    OSA: pt compliant with CPAP and followed by sleep specialist.  -Counseled pt to continue to get re evaluated to make sure CPAP settings are appropriate and that it is functioning properly  HTN: important to continue good blood pressure control at home, it has been on the higher side since admission.  -pt on losartan 100mg  at home, follow up with PCP    Signed, Thornell MuleBrandon Winfrey, MD  11/03/2016, 2:14 PM    I have seen and examined the patient along with Thornell MuleBrandon Winfrey, MD .  I have reviewed the chart, notes and new data.  I agree with his note.  Key new complaints: he has palpitations that  he only notices when lying quietly in bed. He denies exertional dyspnea or angina Key examination changes: in atrial flutter with 4:1 AV block and regular rhythm, morbid obesity, otherwise normal cardiovascular exam. Normal JVD, no leg edema, no gallop, no pulmonary rales. Key new findings / data: ECG shows typical counterclockwise right atrial  flutter with 4:1 AV block. No older tracings show atrial fibrillation or atrial flutter, just frequent PACs. Preliminary review of echo shows normal size right ventricle and right atrium, mild dilated left atrium, moderate left ventricular hypertrophy with borderline low EF around 50%. Formal report pending  PLAN: Although he reports having history of "A. Fib" we have no documentation of this in the current arrhythmias clearly typical counterclockwise right atrial flutter. Embolic risk is increased: CHADSVasc 2 (HTN, LV dysfunction), but without previous history of TIA/stroke or embolic events. Best guess for arrhythmia also discussed roughly 72 hours ago.Recommend anticoagulation with a direct oral anticoagulant. Attempted to schedule for TEE-DC cardioversion tomorrow, but unfortunately no slots available until middle of next week. He is minimally symptomatic and it is very reasonable to let him go home on oral rate control medications and anticoagulation, to return for elective procedure next Thursday. Stopped enoxaparin and started Xarelto. Switched to oral diltiazem. We'll follow up in a.m. If rate remains well controlled on oral medications, okay for DC from our standpoint. Also recommend reevaluation of the CPAP prescription, since most likely underlying substrate for the atrial flutter is obstructive sleep apnea. Long term, he may benefit from atrial flutter cavotricuspid isthmus ablation so we'll refer TEP as outpatient. If successful and he does not return of atrial fibrillation, this would allow discontinuation of anticoagulants.  Thurmon Fair, MD,  [facility] [phone] 11/03/2016, 3:52 PM

## 2016-11-10 NOTE — Interval H&P Note (Signed)
History and Physical Interval Note:  11/10/2016 11:24 AM  Darrell Perkins  has presented today for surgery, with the diagnosis of AFIB  The various methods of treatment have been discussed with the patient and family. After consideration of risks, benefits and other options for treatment, the patient has consented to  Procedure(s): CARDIOVERSION (N/A) TRANSESOPHAGEAL ECHOCARDIOGRAM (TEE) (N/A) as a surgical intervention .  The patient's history has been reviewed, patient examined, no change in status, stable for surgery.  I have reviewed the patient's chart and labs.  Questions were answered to the patient's satisfaction.     Shardea Cwynar

## 2016-11-12 ENCOUNTER — Encounter (HOSPITAL_COMMUNITY): Payer: Self-pay

## 2016-11-12 ENCOUNTER — Emergency Department (HOSPITAL_COMMUNITY)
Admission: EM | Admit: 2016-11-12 | Discharge: 2016-11-12 | Disposition: A | Discharge status: Home / Self Care | Payer: No Typology Code available for payment source | Attending: Emergency Medicine | Admitting: Emergency Medicine

## 2016-11-12 ENCOUNTER — Ambulatory Visit (HOSPITAL_COMMUNITY)
Admission: EM | Admit: 2016-11-12 | Discharge: 2016-11-12 | Disposition: A | Discharge status: Home / Self Care | Payer: No Typology Code available for payment source | Attending: Family Medicine | Admitting: Family Medicine

## 2016-11-12 ENCOUNTER — Encounter (HOSPITAL_COMMUNITY): Payer: Self-pay | Admitting: *Deleted

## 2016-11-12 ENCOUNTER — Emergency Department (HOSPITAL_COMMUNITY): Payer: No Typology Code available for payment source

## 2016-11-12 DIAGNOSIS — Z79899 Other long term (current) drug therapy: Secondary | ICD-10-CM | POA: Diagnosis not present

## 2016-11-12 DIAGNOSIS — R109 Unspecified abdominal pain: Secondary | ICD-10-CM

## 2016-11-12 DIAGNOSIS — R1011 Right upper quadrant pain: Secondary | ICD-10-CM | POA: Diagnosis present

## 2016-11-12 DIAGNOSIS — N289 Disorder of kidney and ureter, unspecified: Secondary | ICD-10-CM

## 2016-11-12 DIAGNOSIS — N179 Acute kidney failure, unspecified: Secondary | ICD-10-CM | POA: Diagnosis not present

## 2016-11-12 DIAGNOSIS — I10 Essential (primary) hypertension: Secondary | ICD-10-CM | POA: Diagnosis not present

## 2016-11-12 DIAGNOSIS — N201 Calculus of ureter: Secondary | ICD-10-CM

## 2016-11-12 DIAGNOSIS — Z7902 Long term (current) use of antithrombotics/antiplatelets: Secondary | ICD-10-CM | POA: Diagnosis not present

## 2016-11-12 DIAGNOSIS — J45909 Unspecified asthma, uncomplicated: Secondary | ICD-10-CM | POA: Insufficient documentation

## 2016-11-12 LAB — POCT I-STAT, CHEM 8
BUN: 23 mg/dL — ABNORMAL HIGH (ref 6–20)
CALCIUM ION: 1.16 mmol/L (ref 1.15–1.40)
Chloride: 102 mmol/L (ref 101–111)
Creatinine, Ser: 2.8 mg/dL — ABNORMAL HIGH (ref 0.61–1.24)
GLUCOSE: 109 mg/dL — AB (ref 65–99)
HCT: 41 % (ref 39.0–52.0)
Hemoglobin: 13.9 g/dL (ref 13.0–17.0)
Potassium: 4.3 mmol/L (ref 3.5–5.1)
SODIUM: 139 mmol/L (ref 135–145)
TCO2: 27 mmol/L (ref 22–32)

## 2016-11-12 LAB — URINALYSIS, ROUTINE W REFLEX MICROSCOPIC
BILIRUBIN URINE: NEGATIVE
Glucose, UA: NEGATIVE mg/dL
HGB URINE DIPSTICK: NEGATIVE
Ketones, ur: NEGATIVE mg/dL
Leukocytes, UA: NEGATIVE
Nitrite: NEGATIVE
Protein, ur: 30 mg/dL — AB
SPECIFIC GRAVITY, URINE: 1.026 (ref 1.005–1.030)
pH: 5 (ref 5.0–8.0)

## 2016-11-12 LAB — BASIC METABOLIC PANEL
Anion gap: 9 (ref 5–15)
BUN: 21 mg/dL — AB (ref 6–20)
CALCIUM: 8.8 mg/dL — AB (ref 8.9–10.3)
CHLORIDE: 104 mmol/L (ref 101–111)
CO2: 24 mmol/L (ref 22–32)
CREATININE: 2.72 mg/dL — AB (ref 0.61–1.24)
GFR calc non Af Amer: 23 mL/min — ABNORMAL LOW (ref >60)
GFR, EST AFRICAN AMERICAN: 26 mL/min — AB (ref >60)
Glucose, Bld: 104 mg/dL — ABNORMAL HIGH (ref 65–99)
Potassium: 4 mmol/L (ref 3.5–5.1)
SODIUM: 137 mmol/L (ref 135–145)

## 2016-11-12 LAB — POCT URINALYSIS DIP (DEVICE)
Bilirubin Urine: NEGATIVE
Glucose, UA: NEGATIVE mg/dL
Ketones, ur: NEGATIVE mg/dL
Leukocytes, UA: NEGATIVE
NITRITE: NEGATIVE
PH: 5.5 (ref 5.0–8.0)
PROTEIN: 30 mg/dL — AB
Specific Gravity, Urine: 1.025 (ref 1.005–1.030)
Urobilinogen, UA: 0.2 mg/dL (ref 0.0–1.0)

## 2016-11-12 LAB — CBC
HCT: 41.4 % (ref 39.0–52.0)
Hemoglobin: 13.5 g/dL (ref 13.0–17.0)
MCH: 28.6 pg (ref 26.0–34.0)
MCHC: 32.6 g/dL (ref 30.0–36.0)
MCV: 87.7 fL (ref 78.0–100.0)
PLATELETS: 235 10*3/uL (ref 150–400)
RBC: 4.72 MIL/uL (ref 4.22–5.81)
RDW: 13.5 % (ref 11.5–15.5)
WBC: 5 10*3/uL (ref 4.0–10.5)

## 2016-11-12 MED ORDER — KETOROLAC TROMETHAMINE 60 MG/2ML IM SOLN
60.0000 mg | Freq: Once | INTRAMUSCULAR | Status: AC
  Administered 2016-11-12: 60 mg via INTRAMUSCULAR

## 2016-11-12 MED ORDER — KETOROLAC TROMETHAMINE 60 MG/2ML IM SOLN
INTRAMUSCULAR | Status: AC
  Filled 2016-11-12: qty 2

## 2016-11-12 MED ORDER — OXYCODONE-ACETAMINOPHEN 5-325 MG PO TABS: 1.0000 | ORAL_TABLET | Freq: Four times a day (QID) | ORAL | 0 refills | Status: DC | PRN

## 2016-11-12 NOTE — Discharge Instructions (Signed)
Follow-up with the urologist by calling first thing Monday morning.  Return for any worsening in your condition

## 2016-11-12 NOTE — ED Triage Notes (Signed)
Pt reports right flank pain that started yesterday. [facility] today and [facility]. [facility]

## 2016-11-12 NOTE — ED Notes (Signed)
Patient on phone when this RN went in to room.  NAD.  Patient given urinal and made aware of need for urine sample.  Will return to assess patient.

## 2016-11-12 NOTE — Discharge Instructions (Addendum)
Hold off on taking your blood pressure medicine metoprolol. Your heart rate in the office today was 50 (this is low) and this medicine may lower it further. Call your primary doctor on Monday morning to schedule follow up in order to decide what you need to manage your high blood pressure.

## 2016-11-12 NOTE — ED Triage Notes (Signed)
States scheduled to see urologist in couple wks.  States feels like past kidney stones.

## 2016-11-12 NOTE — ED Notes (Signed)
Patient given [employer].  Okay'd by MD

## 2016-11-12 NOTE — ED Triage Notes (Signed)
C/O right flank pain 10 days ago; went into hospital for flank pain and providers found cardiac arrhythmia; describes having had chemical cardioversion and cardiac admission; states flank pain was "ignored" due to cardiac reasons.

## 2016-11-12 NOTE — ED Notes (Signed)
Patient able to ambulate independently  

## 2016-11-13 NOTE — ED Provider Notes (Signed)
[facility]EM[facility]ENCY DEPT Provider Note   CSN: [id number] Arrival date & time: 11/12/16  1332     History   Chief Complaint Chief Complaint  Patient presents with  . Flank Pain    HPI Darrell Perkins is a 66 y.o. male.  HPI Patient presents to the emergency department with right flank pain that started last week.  The patient states he came to the emergency department for flank pain.  It was noted to have an irregular heart rhythm states he was admitted to the hospital for this.  They did not address his flank pain.  The patient states that nothing seems make the condition better or worse.  He states that the pain seemed worse last night and continued to get worse today.  The patient states that he did not take any medications prior to arrival. The patient denies chest pain, shortness of breath, headache,blurred vision, neck pain, fever, cough, weakness, numbness, dizziness, anorexia, edema,  nausea, vomiting, diarrhea, rash, back pain, dysuria, hematemesis, bloody stool, near syncope, or syncope.  Patient was seen at urgent care prior to arrival in the gave him Toradol IM and he states he has no pain at this time Past Medical History:  Diagnosis Date  . Bronchitis    hx of   . 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 Dysrhythmia   . GERD (gastroesophageal reflux disease)   . Irregular heartbeat   . Kidney stones   . Pneumonia    hx of   . Rapid atrial fibrillation (HCC) 11/26/2014  . Sleep apnea    USES CPAP    Patient Active Problem List   Diagnosis Date Noted  . Leukopenia 11/03/2016  . Atrial fibrillation with RVR (HCC) 11/02/2016  . Allergic rhinitis 11/26/2014  . Cutaneous eruption 11/26/2014  . Uncomplicated asthma 11/26/2014  . Asthma, extrinsic, without status asthmaticus 11/26/2014  . Nephrolithiasis 11/26/2014  . Cellulitis and abscess of leg 11/26/2014  . ED (erectile dysfunction) of organic origin 11/26/2014  . Benign essential HTN 11/26/2014  . Peripheral  neuralgia 11/26/2014  . Cephalalgia 11/26/2014  . Tinea cruris 11/26/2014  . Folliculitis 11/26/2014  . Hypercholesterolemia without hypertriglyceridemia 11/26/2014  . Hematuria, microscopic 11/26/2014  . H/O adenomatous polyp of colon 11/26/2014  . Snoring 11/26/2014  . Difficulty staying awake 11/26/2014  . Essential hypertension 11/26/2014  . Combined fat and carbohydrate induced hyperlipemia 11/26/2014  . Tinea pedis 11/26/2014  . Erectile dysfunction due to arterial insufficiency 11/26/2014  . Perifolliculitis capitis abscedens 11/26/2014  . Other constipation 11/26/2014  . Chest pain 11/26/2014  . Other fatigue 11/26/2014  . Pain of right leg 11/26/2014  . Lesion of lateral popliteal nerve 11/26/2014  . Chronic kidney disease, stage III (moderate) 11/26/2014  . 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 Athlete's foot 11/26/2014  . Mixed hyperlipidemia 11/26/2014  . Adiposity 11/26/2014  . Polypharmacy 11/26/2014  . 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 Chest pain syndrome 11/26/2014  . Rapid atrial fibrillation (HCC) 11/26/2014  . OSA on CPAP 11/26/2014  . Encounter for general adult medical examination without abnormal findings 09/02/2014    Past Surgical History:  Procedure Laterality Date  . colonscopy     . CYSTOSCOPY WITH RETROGRADE PYELOGRAM, URETEROSCOPY AND STENT PLACEMENT Right 08/01/2014   Procedure: CYSTOSCOPY WITH RIGHT  RETROGRADE PYELOGRAM/RIGHT URETEROSCOPY HOLMIUMN LASER LITOTRIPSY  STENT PLACEMENT ;  Surgeon: Jerilee Field, MD;  Location: [facility];  Service: Urology;  Laterality: Right;       Home Medications  Prior to Admission medications   Medication Sig Start Date End Date Taking? Authorizing Provider  acetaminophen (TYLENOL) 500 MG tablet Take 500 mg by mouth every 6 (six) hours as needed (pain).  06/20/14   [provider]  albuterol (PROVENTIL HFA;VENTOLIN HFA) 108 (90 BASE) MCG/ACT inhaler Inhale 1-2 puffs into the lungs  every 6 (six) hours as needed for wheezing. 04/14/12   Sunnie Nielsenpitz, Brian, MD  Bee Pollen 580 MG CAPS Take 580 mg by mouth as directed.    [provider]  budesonide-formoterol (SYMBICORT) 160-4.5 MCG/ACT inhaler Inhale 2 puffs into the lungs 3 (three) times a week.    [provider]  diltiazem (CARDIZEM CD) 360 MG 24 hr capsule Take 1 capsule (360 mg total) by mouth daily. 11/05/16   Rhetta MuraSamtani, Jai-Gurmukh, MD  meloxicam (MOBIC) 15 MG tablet Take 15 mg by mouth every other day. 09/01/16   [provider]  metoprolol tartrate (LOPRESSOR) 25 MG tablet Take 0.5 tablets (12.5 mg total) by mouth 2 (two) times daily. 11/10/16   Croitoru, Mihai, MD  mometasone (NASONEX) 50 MCG/ACT nasal spray Place 1 spray into both nostrils daily as needed (allergies).  08/21/12   [provider]  oxyCODONE-acetaminophen (PERCOCET/ROXICET) 5-325 MG tablet Take 1 tablet by mouth every 6 (six) hours as needed for severe pain. 11/12/16   Matteson Blue, Cristal Deerhristopher, PA-C  polyethylene glycol (MIRALAX / GLYCOLAX) packet Take 17 g by mouth daily. 11/04/16   Rhetta MuraSamtani, Jai-Gurmukh, MD  Rivaroxaban (XARELTO) 15 MG TABS tablet Take 1 tablet (15 mg total) by mouth daily with supper. 11/04/16   Rhetta MuraSamtani, Jai-Gurmukh, MD  senna-docusate (SENOKOT-S) 8.6-50 MG tablet Take 1-2 tablets by mouth at bedtime as needed for mild constipation. 11/04/16   Rhetta MuraSamtani, Jai-Gurmukh, MD    Family History Family History  Problem Relation Age of Onset  . Hypertension Mother   . Diabetes Father   . Cerebrovascular Disease Father   . Alcohol abuse Father   . Hypertension Father   . COPD Father     Social History Social History  Substance Use Topics  . Smoking status: Never Smoker  . Smokeless tobacco: Never Used  . Alcohol use No     Allergies   Allopurinol; Aspirin; Prednisone; Rosuvastatin; Simvastatin; and Tuberculin tests   Review of Systems Review of Systems  All other systems negative except as documented in the HPI.  All pertinent positives and negatives as reviewed in the HPI. Physical Exam Updated Vital Signs BP (!) 163/58   Pulse (!) 56   Temp 98.3 F (36.8 C) (Oral)   Resp 18   Ht 6\' 3"  (1.905 m)   Wt 117.9 kg (260 lb)   SpO2 92%   BMI 32.50 kg/m   Physical Exam  Constitutional: He is oriented to person, place, and time. He appears well-developed and well-nourished. No distress.  HENT:  Head: Normocephalic and atraumatic.  Mouth/Throat: Oropharynx is clear and moist.  Eyes: Pupils are equal, round, and reactive to light.  Neck: Normal range of motion. Neck supple.  Cardiovascular: Normal rate, regular rhythm and normal heart sounds.  Exam reveals no gallop and no friction rub.   No murmur heard. Pulmonary/Chest: Effort normal and breath sounds normal. No respiratory distress. He has no wheezes.  Abdominal: Soft. Bowel sounds are normal. He exhibits no distension. There is no tenderness.  Neurological: He is alert and oriented to person, place, and time. He exhibits normal muscle tone. Coordination normal.  Skin: Skin is warm and dry. Capillary refill takes  less than 2 seconds. No rash noted. No erythema.  Psychiatric: He has a normal mood and affect. His behavior is normal.  Nursing note and vitals reviewed.    ED Treatments / Results  Labs (all labs ordered are listed, but only abnormal results are displayed) Labs Reviewed  URINALYSIS, ROUTINE W REFLEX MICROSCOPIC - Abnormal; Notable for the following:       Result Value   Protein, ur 30 (*)    Bacteria, UA RARE (*)    Squamous Epithelial / LPF 0-5 (*)    All other components within normal limits  BASIC METABOLIC PANEL - Abnormal; Notable for the following:    Glucose, Bld 104 (*)    BUN 21 (*)    Creatinine, Ser 2.72 (*)    Calcium 8.8 (*)    GFR calc non Af Amer 23 (*)    GFR calc Af Amer 26 (*)    All other components within normal limits  CBC    EKG  EKG Interpretation None       Radiology Ct Renal Stone  Study  Result Date: 11/12/2016 CLINICAL DATA:  66 year old male with history of right lower quadrant abdominal pain since yesterday. History of kidney stones. EXAM: CT ABDOMEN AND PELVIS WITHOUT CONTRAST TECHNIQUE: Multidetector CT imaging of the abdomen and pelvis was performed following the standard protocol without IV contrast. COMPARISON:  CT the abdomen and pelvis 07/01/2014. FINDINGS: Lower chest: 3 mm right middle lobe pulmonary nodule (axial image 10 of series 5), stable compared to prior study 07/01/2014, considered definitively benign. Hepatobiliary: No cystic or solid hepatic lesions are confidently identified on today's noncontrast CT examination. Unenhanced appearance of the gallbladder is unremarkable. Pancreas: No definite pancreatic mass or peripancreatic inflammatory changes are noted on today's noncontrast CT examination. Spleen: Unremarkable. Adrenals/Urinary Tract: 12 x 7 x 20 mm calculus in the right proximal ureter at and immediately beyond the right ureteropelvic junction (axial image 38 of series 3 and coronal image 55 of series 6) with mild proximal hydronephrosis. Additional 7 mm nonobstructive calculus in the lower pole collecting system of the right kidney also noted. No additional calculi are noted within the left renal collecting system, along the course of the left ureter or within the lumen of the urinary bladder. Unenhanced appearance of the kidneys is otherwise unremarkable. Urinary bladder is normal in appearance. Bilateral adrenal glands are normal in appearance. Stomach/Bowel: The appearance of the stomach is normal. There is no pathologic dilatation of small bowel or colon. Normal appendix. Vascular/Lymphatic: Aortic atherosclerosis without definite aneurysm in the abdominal or pelvic vasculature. No lymphadenopathy noted in the abdomen or pelvis. Reproductive: Prostate gland and seminal vesicles are unremarkable in appearance. Other: No significant volume of ascites.  No  pneumoperitoneum. Musculoskeletal: There are no aggressive appearing lytic or blastic lesions noted in the visualized portions of the skeleton. IMPRESSION: 1. Large 12 x 7 x 20 mm calculus at and immediately beyond the right ureteropelvic junction with mild proximal right hydronephrosis. 2. 7 mm nonobstructive calculus in the lower pole collecting system of the right kidney. 3. Normal appendix. 4. Aortic atherosclerosis. Electronically Signed   By: Trudie Reed M.D.   On: 11/12/2016 19:09    Procedures Procedures (including critical care time)  Medications Ordered in ED Medications - No data to display   Initial Impression / Assessment and Plan / ED Course  I have reviewed the triage vital signs and the nursing notes.  Pertinent labs & imaging results that were available during  my care of the patient were reviewed by me and considered in my medical decision making (see chart for details).     I spoke with Dr. Retta Diones of urology, who reviewed the patient's findings and history, he would like for the patient to be discharged home since he is stable and having no pain at this time and no other symptoms.  Patient will be given pain control urology would like to see the patient on Monday for follow-up and evaluation.  Patient is advised if his condition worsens with 2 now and then to come to the emergency department for reevaluation  Final Clinical Impressions(s) / ED Diagnoses   Final diagnoses:  Ureterolithiasis    New Prescriptions Discharge Medication List as of 11/12/2016  9:05 PM    START taking these medications   Details  oxyCODONE-acetaminophen (PERCOCET/ROXICET) 5-325 MG tablet Take 1 tablet by mouth every 6 (six) hours as needed for severe pain., Starting Sat 11/12/2016, Print         Reeta Kuk, [place], PA-C 11/13/16 1610    Gerhard Munch, MD 11/13/16 [phone]

## 2016-11-14 ENCOUNTER — Other Ambulatory Visit: Payer: Self-pay | Admitting: Urology

## 2016-11-14 ENCOUNTER — Ambulatory Visit (INDEPENDENT_AMBULATORY_CARE_PROVIDER_SITE_OTHER): Payer: Self-pay | Admitting: Orthopaedic Surgery

## 2016-11-14 ENCOUNTER — Telehealth: Payer: Self-pay | Admitting: Cardiovascular Disease

## 2016-11-14 DIAGNOSIS — N202 Calculus of kidney with calculus of ureter: Secondary | ICD-10-CM | POA: Diagnosis not present

## 2016-11-14 NOTE — Telephone Encounter (Signed)
Received signed Attending Physicians Statement back from Dr Royann Shiversroitoru.  Faxed to [employer] and mailed patient copy. lp

## 2016-11-14 NOTE — ED Provider Notes (Signed)
[facility]   [id number] 11/12/16 Arrival Time: 1205  ASSESSMENT & PLAN:  1. Acute renal insufficiency   2. Right flank pain   3. Essential hypertension     Meds ordered this encounter  Medications  . ketorolac (TORADOL) injection 60 mg   Given Cr of 2.80, patient sent to the ED for evaluation. Stable upon discharge.  Reviewed expectations re: course of current medical issues. Questions answered. Outlined signs and symptoms indicating need for more acute intervention. Patient verbalized understanding. After Visit Summary given.   SUBJECTIVE:  Darrell Perkins is a 66 y.o. male who presents with complaint of R flank discomfort. Onset 10 days ago. Off and on. Described as aching. Location: R flank with radiation to R groin. Described symptoms are unchanged since. Aggravating factors: none. Alleviating factors: NSAIDs. Associated symptoms: nausea. The patient denies diarrhea, fever, hematuria and vomiting. H/O kidney stones. Last evening pain much worse. Here pain has eased off. Still feels mild discomfort.  Recently discharged from hospital. Records reviewed.  ROS: As per HPI. All other systems negative.  OBJECTIVE:  Vitals:   11/12/16 1232  BP: (!) 190/101  Pulse: (!) 50  Resp: 16  Temp: 98.3 F (36.8 C)  TempSrc: Oral  SpO2: 96%    General appearance: alert; no distressa Lungs: clear to auscultation bilaterally Heart: regular rate and rhythm Abdomen: soft; no tenderness; bowel sounds normal; no masses or organomegaly; no guarding or rebound tenderness Back: no CVA tenderness Extremities: no cyanosis or edema; symmetrical with no gross deformities Skin: warm and dry Neurologic: normal gait Psychological: alert and cooperative; normal mood and affect  Labs: Results for orders placed or performed during the hospital encounter of 11/12/16  POCT urinalysis dip (device)  Result Value Ref Range   Glucose, UA NEGATIVE NEGATIVE mg/dL   Bilirubin Urine NEGATIVE  NEGATIVE   Ketones, ur NEGATIVE NEGATIVE mg/dL   Specific Gravity, Urine 1.025 1.005 - 1.030   Hgb urine dipstick SMALL (A) NEGATIVE   pH 5.5 5.0 - 8.0   Protein, ur 30 (A) NEGATIVE mg/dL   Urobilinogen, UA 0.2 0.0 - 1.0 mg/dL   Nitrite NEGATIVE NEGATIVE   Leukocytes, UA NEGATIVE NEGATIVE  I-STAT, chem 8  Result Value Ref Range   Sodium 139 135 - 145 mmol/L   Potassium 4.3 3.5 - 5.1 mmol/L   Chloride 102 101 - 111 mmol/L   BUN 23 (H) 6 - 20 mg/dL   Creatinine, Ser 5.28 (H) 0.61 - 1.24 mg/dL   Glucose, Bld 413 (H) 65 - 99 mg/dL   Calcium, Ion 2.44 0.10 - 1.40 mmol/L   TCO2 27 22 - 32 mmol/L   Hemoglobin 13.9 13.0 - 17.0 g/dL   HCT 27.2 53.6 - 64.4 %   Labs Reviewed  POCT URINALYSIS DIP (DEVICE) - Abnormal; Notable for the following:       Result Value   Hgb urine dipstick SMALL (*)    Protein, ur 30 (*)    All other components within normal limits  POCT I-STAT, CHEM 8 - Abnormal; Notable for the following:    BUN 23 (*)    Creatinine, Ser 2.80 (*)    Glucose, Bld 109 (*)    All other components within normal limits     Allergies  Allergen Reactions  . Allopurinol Other (See Comments)    "Upset stomach"  . Aspirin     Upset stomach  . Prednisone Other (See Comments)    "Irritable"  . Rosuvastatin Other (See Comments)  Mental disturbance  . Simvastatin Other (See Comments)    Tired and achy  . Tuberculin Tests Other (See Comments)    Intense local reaction                                               Past Medical History:  Diagnosis Date  . Bronchitis    hx of   . 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 Dysrhythmia   . GERD (gastroesophageal reflux disease)   . Irregular heartbeat   . Kidney stones   . Pneumonia    hx of   . Rapid atrial fibrillation (HCC) 11/26/2014  . Sleep apnea    USES CPAP   Social History   Social History  . Marital status: Married    Spouse name: N/A  . Number of children: N/A  . Years of education: N/A    Occupational History  . Not on file.   Social History Main Topics  . Smoking status: Never Smoker  . Smokeless tobacco: Never Used  . Alcohol use No  . Drug use: No  . Sexual activity: Not on file   Other Topics Concern  . Not on file   Social History Narrative  . No narrative on file   Family History  Problem Relation Age of Onset  . Hypertension Mother   . Diabetes Father   . Cerebrovascular Disease Father   . Alcohol abuse Father   . Hypertension Father   . COPD Father    Past Surgical History:  Procedure Laterality Date  . colonscopy     . CYSTOSCOPY WITH RETROGRADE PYELOGRAM, URETEROSCOPY AND STENT PLACEMENT Right 08/01/2014   Procedure: CYSTOSCOPY WITH RIGHT  RETROGRADE PYELOGRAM/RIGHT URETEROSCOPY HOLMIUMN LASER LITOTRIPSY  STENT PLACEMENT ;  Surgeon: Darrell FieldMatthew Eskridge, MD;  Location: [facility];  Service: Urology;  Laterality: Right;     Darrell Perkins, Darrell Bento, MD 11/14/16 [phone]

## 2016-11-15 ENCOUNTER — Telehealth: Payer: Self-pay | Admitting: Cardiovascular Disease

## 2016-11-15 ENCOUNTER — Encounter (HOSPITAL_COMMUNITY): Payer: Self-pay | Admitting: *Deleted

## 2016-11-15 NOTE — Progress Notes (Signed)
Spoke with Dr. Chaney MallingHodierne, Anesthesia about patient's medical history and he reviewed EKG from 11/10/2016 and he he is OK for patient to proceed with surgery.

## 2016-11-15 NOTE — Telephone Encounter (Signed)
Patient of Dr. Harlin Heysroitoru/Dr. Tenny Crawoss - will see Dr. Johney FrameAllred for EP eval who has h/o A-Flutter (new at ED after being found to have kidney stone) walked in to office to inquire about how long to hold Xarelto prior to 9/14 kidney stone removal surgery w/Dr. Mena GoesEskridge. He also wanted to know about his metoprolol tartrate dose as he was told in the ED that his BP was low.   Spoke with Belenda CruiseKristin, PharmD and patient should hold Xarelto 3 days prior to procedure on 9/14 - hold 9/11, 9/12, 9/13 - based on renal function.   Per chart review, patient was to have TEE/DCCV w/Dr. Royann Shiversroitoru but this was cancelled d/t patient being in SR.   Advised patient on recommendations per PharmD and advised that he continue all other medications as directed by hospital discharge summary. He will keep follow up with Dr. Johney FrameAllred on 11/30/16 for cardiac/EP eval.   Message routed to MD(s)

## 2016-11-17 NOTE — H&P (Signed)
Office Visit Report     11/14/2016    Darrell Perkins         MRN: [id number]  PRIMARY CARE:  Johnnette Barrios, MD  DOB: 1951-01-17, 66 year old Male  REFERRING:  R Robley Fries, MD  SSN: [id number]  PROVIDER:  Jerilee Field, M.D.    LOCATION:  [facility] [phone]    CC: I have ureteral stone.  HPI: Darrell Perkins is a 66 year-old male established patient who is here for ureteral stone.  The problem is on the right side. He first stated noticing pain on approximately 11/07/2016. This is not his first kidney stone. He is currently having back pain and groin pain. He denies having flank pain, nausea, vomiting, fever, and chills. Pain is occuring on both sides. He has not caught a stone in his urine strainer since his symptoms began.   He has had ureteral stent and ureteroscopy for treatment of his stones in the past.   Pain in left flank and now RLQ. He underwent a CT scan November 12, 2016 which revealed a 21 x 8 mm stone in the right proximal ureter about 2 cm down from the right UPJ. HU 565, ? visible. He's doing well and drinking water.  wbc  bun, cr . He has CKD with cr ~2. Urine pH 5.5.   H/o uric acid and ca ox stones.   ALLERGIES: Aspirin TABS   MEDICATIONS: Metoprolol Tartrate 25 mg tablet  Acetaminophen  Bee Pollen 580 mg capsule  Diltiazem 24Hr Cd 360 mg capsule, ext release 24 hr  Miralax  Nasonex 50 mcg/actuation aerosol, spray with pump  Oxycodone-Acetaminophen 5 mg-325 mg tablet  Proventil Hfa  Symbicort    GU PSH: Cysto Uretero Lithotripsy - 07/15/14 Cystoscopy Insert Stent - 07-15-2014     PSH Notes: Cystoscopy With Ureteroscopy With Lithotripsy, Cystoscopy With Insertion Of Ureteral Stent Right, No Surgical Problems   NON-GU PSH: None   GU PMH: Renal calculus, Nephrolithiasis - Jul 15, 2014 Ureteral calculus, Calculus of right ureter - Jul 15, 2014 Hydronephrosis Unspec, Hydronephrosis, right - 07-15-2014 Urinary Tract Inf, Unspec site, Pyuria - 07-15-2014 History of urolithiasis,  History of renal calculi - 07/15/14 Primary hypogonadism, Hypogonadism, 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, 2014 BPH w/o LUTS, Benign prostatic hypertrophy without lower urinary tract symptoms - Jul 14, 2012 ED due to arterial insufficiency, Erectile dysfunction due to arterial insufficiency - 07/14/2012   NON-GU PMH: Encounter for general adult medical examination without abnormal findings, Encounter for preventive health examination - 15-Jul-2014 Dependence on other enabling machines and devices, CPAP (continuous positive airway pressure) dependence - Jul 15, 2014 Obstructive sleep apnea (adult) (pediatric), Obstructive sleep apnea, adult - 07/15/14 Personal history of other diseases of the digestive system, History of esophageal reflux - 07-15-2014 Personal history of other mental and behavioral disorders, History of depression - 15-Jul-2014   FAMILY HISTORY: Deceased - Father, Mother Diabetes - Runs In Family Kidney Stones - Runs In Family   SOCIAL HISTORY: Marital Status: Married Preferred Language: English; Ethnicity: Not Hispanic Or Latino; Race: Black or African American Current Smoking Status: Patient has never smoked.   Tobacco Use Assessment Completed:  Used Tobacco in last 30 days?    REVIEW OF SYSTEMS:    GU Review Male:   Patient denies frequent urination, hard to postpone urination, burning/ pain with urination, get up at night to urinate, leakage of urine, stream starts and stops, trouble starting your stream, have to strain to urinate , erection problems, and penile pain.  Gastrointestinal (Upper):  Patient denies nausea, vomiting, and indigestion/ heartburn.  Gastrointestinal (Lower):   Patient denies diarrhea and constipation.  Constitutional:   Patient denies fever, night sweats, weight loss, and fatigue.  Skin:   Patient denies skin rash/ lesion and itching.  Eyes:   Patient denies blurred vision and double vision.  Ears/ Nose/ Throat:   Patient denies sore throat and sinus problems.  Hematologic/Lymphatic:   Patient denies swollen  glands and easy bruising.  Cardiovascular:   Patient denies leg swelling and chest pains.  Respiratory:   Patient denies cough and shortness of breath.  Endocrine:   Patient denies excessive thirst.  Musculoskeletal:   Patient denies back pain and joint pain.  Neurological:   Patient denies headaches and dizziness.  Psychologic:   Patient denies depression and anxiety.   VITAL SIGNS:      11/14/2016 11:46 AM  Weight 264 lb / 119.75 kg  Height 75 in / 190.5 cm  BP 163/75 mmHg  Pulse 46 /min  BMI 33.0 kg/m   GU PHYSICAL EXAMINATION:    Anus and Perineum: No hemorrhoids. No anal stenosis. No rectal fissure, no anal fissure. No edema, no dimple, no perineal tenderness, no anal tenderness.  Scrotum: No lesions. No edema. No cysts. No warts.  Epididymides: Right: no spermatocele, no masses, no cysts, no tenderness, no induration, no enlargement. Left: no spermatocele, no masses, no cysts, no tenderness, no induration, no enlargement.  Testes: No tenderness, no swelling, no enlargement left testes. No tenderness, no swelling, no enlargement right testes. Normal location left testes. Normal location right testes. No mass, no cyst, no varicocele, no hydrocele left testes. No mass, no cyst, no varicocele, no hydrocele right testes.  Urethral Meatus: Normal size. No lesion, no wart, no discharge, no polyp. Normal location.  Penis: Circumcised, no warts, no cracks. No dorsal Peyronie's plaques, no left corporal Peyronie's plaques, no right corporal Peyronie's plaques, no scarring, no warts. No balanitis, no meatal stenosis.  Prostate: 40 gram or 2+ size. Left lobe normal consistency, right lobe normal consistency. Symmetrical lobes. No prostate nodule. Left lobe no tenderness, right lobe no tenderness.  Seminal Vesicles: Nonpalpable.  Sphincter Tone: Normal sphincter. No rectal tenderness. No rectal mass.    MULTI-SYSTEM PHYSICAL EXAMINATION:    Constitutional: Well-nourished. No physical  deformities. Normally developed. Good grooming.  Neck: Neck symmetrical, not swollen. Normal tracheal position.  Respiratory: No labored breathing, no use of accessory muscles.   Cardiovascular: Normal temperature, normal extremity pulses, no swelling, no varicosities.  Skin: No paleness, no jaundice, no cyanosis. No lesion, no ulcer, no rash.  Neurologic / Psychiatric: Oriented to time, oriented to place, oriented to person. No depression, no anxiety, no agitation.    PAST DATA REVIEWED:  Source Of History:  Patient  Lab Test Review:   BUN/Creatinine  Records Review:   Previous Doctor Records  X-Ray Review: C.T. Abdomen/Pelvis: Reviewed Films.    07/24/14 05/02/07  PSA  Total PSA 0.55  0.24    PROCEDURES:         Urinalysis w/Scope - 81001 Dipstick Dipstick Cont'd Micro  Color: Yellow Bilirubin: Neg WBC/hpf: 0 - 5/hpf  Appearance: Clear Ketones: Neg RBC/hpf: 3 - 10/hpf  Specific Gravity: 1.025 Blood: Trace Bacteria: Rare (0-9/hpf)  pH: 5.5 Protein: 1+ Cystals: Hippuric  Glucose: Neg Urobilinogen: 0.2 Casts: NS (Not Seen)    Nitrites: Neg Trichomonas: Not Present    Leukocyte Esterase: Neg Mucous: Present      Epithelial Cells: 0 - 5/hpf  Yeast: NS (Not Seen)      Sperm: Not Present   Notes:    ASSESSMENT:      ICD-10 Details  1 GU:   Ureteral calculus - N20.1   2   Renal calculus - N20.0    PLAN:          Orders Labs Urine Culture         Schedule Return Visit/Planned Activity: Next Available Appointment - Schedule Surgery         Document Letter(s):  Created for Patient: Clinical Summary        Notes:   ureteral stone - he also has a right renal stone-I also drew him a picture of the anatomy and we reviewed the CT. We discussed the nature risks and benefits of continued surveillance, shockwave lithotripsy, ureteroscopy and PCNL. Given the stone size and location he's going to be best be served by ureteroscopy and we discussed he may need a staged procedure. I sent  the urine for culture and we'll get him scheduled as soon as we can. We discussed follow-up precautions.   cc: [name]    ** Signed by Jerilee Field, M.D. on 11/14/16 at 4:18 PM (EDT)**     The information contained in this medical record document is considered private and confidential patient information. This information can only be used for the medical diagnosis and/or medical services that are being provided by the patient's selected caregivers. This information can only be distributed outside of the patient's care if the patient agrees and signs waivers of authorization for this information to be sent to an outside source or route.

## 2016-11-18 ENCOUNTER — Ambulatory Visit (HOSPITAL_COMMUNITY): Payer: Medicare (Managed Care) | Admitting: Anesthesiology

## 2016-11-18 ENCOUNTER — Encounter (HOSPITAL_COMMUNITY)
Admission: RE | Disposition: A | Discharge status: Home / Self Care | Payer: Self-pay | Source: Ambulatory Visit | Attending: Urology

## 2016-11-18 ENCOUNTER — Encounter (HOSPITAL_COMMUNITY): Payer: Self-pay | Admitting: *Deleted

## 2016-11-18 ENCOUNTER — Ambulatory Visit (HOSPITAL_COMMUNITY)
Admission: RE | Admit: 2016-11-18 | Discharge: 2016-11-18 | Disposition: A | Discharge status: Home / Self Care | Payer: Medicare (Managed Care) | Source: Ambulatory Visit | Attending: Urology | Admitting: Urology

## 2016-11-18 ENCOUNTER — Ambulatory Visit (HOSPITAL_COMMUNITY): Payer: Medicare (Managed Care)

## 2016-11-18 DIAGNOSIS — G4733 Obstructive sleep apnea (adult) (pediatric): Secondary | ICD-10-CM | POA: Diagnosis not present

## 2016-11-18 DIAGNOSIS — N400 Enlarged prostate without lower urinary tract symptoms: Secondary | ICD-10-CM | POA: Insufficient documentation

## 2016-11-18 DIAGNOSIS — N202 Calculus of kidney with calculus of ureter: Secondary | ICD-10-CM | POA: Diagnosis not present

## 2016-11-18 DIAGNOSIS — N132 Hydronephrosis with renal and ureteral calculous obstruction: Secondary | ICD-10-CM | POA: Insufficient documentation

## 2016-11-18 HISTORY — PX: URETEROSCOPY WITH HOLMIUM LASER LITHOTRIPSY: SHX6645

## 2016-11-18 LAB — BASIC METABOLIC PANEL
ANION GAP: 7 (ref 5–15)
BUN: 25 mg/dL — AB (ref 6–20)
CALCIUM: 8.8 mg/dL — AB (ref 8.9–10.3)
CO2: 24 mmol/L (ref 22–32)
Chloride: 107 mmol/L (ref 101–111)
Creatinine, Ser: 2.81 mg/dL — ABNORMAL HIGH (ref 0.61–1.24)
GFR calc Af Amer: 25 mL/min — ABNORMAL LOW (ref >60)
GFR calc non Af Amer: 22 mL/min — ABNORMAL LOW (ref >60)
GLUCOSE: 110 mg/dL — AB (ref 65–99)
Potassium: 4.3 mmol/L (ref 3.5–5.1)
Sodium: 138 mmol/L (ref 135–145)

## 2016-11-18 SURGERY — URETEROSCOPY, WITH LITHOTRIPSY USING HOLMIUM LASER
Anesthesia: General | Site: Ureter | Laterality: Right

## 2016-11-18 MED ORDER — IOHEXOL 300 MG/ML  SOLN
INTRAMUSCULAR | Status: DC | PRN
  Administered 2016-11-18: 50 mL via INTRAVENOUS

## 2016-11-18 MED ORDER — EPHEDRINE SULFATE-NACL 50-0.9 MG/10ML-% IV SOSY
PREFILLED_SYRINGE | INTRAVENOUS | Status: DC | PRN
  Administered 2016-11-18 (×3): 5 mg via INTRAVENOUS

## 2016-11-18 MED ORDER — LACTATED RINGERS IV SOLN
INTRAVENOUS | Status: DC
  Administered 2016-11-18 (×2): via INTRAVENOUS

## 2016-11-18 MED ORDER — ONDANSETRON HCL 4 MG/2ML IJ SOLN
INTRAMUSCULAR | Status: AC
  Filled 2016-11-18: qty 2

## 2016-11-18 MED ORDER — CEFAZOLIN SODIUM-DEXTROSE 2-4 GM/100ML-% IV SOLN
2.0000 g | INTRAVENOUS | Status: AC
  Administered 2016-11-18: 2 g via INTRAVENOUS
  Filled 2016-11-18: qty 100

## 2016-11-18 MED ORDER — LIDOCAINE 2% (20 MG/ML) 5 ML SYRINGE
INTRAMUSCULAR | Status: AC
  Filled 2016-11-18: qty 5

## 2016-11-18 MED ORDER — FENTANYL CITRATE (PF) 100 MCG/2ML IJ SOLN: 25.0000 ug | INTRAMUSCULAR | Status: DC | PRN

## 2016-11-18 MED ORDER — FENTANYL CITRATE (PF) 100 MCG/2ML IJ SOLN
INTRAMUSCULAR | Status: AC
  Filled 2016-11-18: qty 2

## 2016-11-18 MED ORDER — ONDANSETRON HCL 4 MG/2ML IJ SOLN
INTRAMUSCULAR | Status: DC | PRN
  Administered 2016-11-18: 4 mg via INTRAVENOUS

## 2016-11-18 MED ORDER — DEXAMETHASONE SODIUM PHOSPHATE 10 MG/ML IJ SOLN
INTRAMUSCULAR | Status: AC
  Filled 2016-11-18: qty 1

## 2016-11-18 MED ORDER — LIDOCAINE 2% (20 MG/ML) 5 ML SYRINGE
INTRAMUSCULAR | Status: DC | PRN
  Administered 2016-11-18: 100 mg via INTRAVENOUS

## 2016-11-18 MED ORDER — PROPOFOL 10 MG/ML IV BOLUS
INTRAVENOUS | Status: DC | PRN
  Administered 2016-11-18: 200 mg via INTRAVENOUS
  Administered 2016-11-18: 50 mg via INTRAVENOUS

## 2016-11-18 MED ORDER — ONDANSETRON HCL 4 MG/2ML IJ SOLN: 4.0000 mg | Freq: Once | INTRAMUSCULAR | Status: DC | PRN

## 2016-11-18 MED ORDER — MEPERIDINE HCL 50 MG/ML IJ SOLN: 6.2500 mg | INTRAMUSCULAR | Status: DC | PRN

## 2016-11-18 MED ORDER — MIDAZOLAM HCL 2 MG/2ML IJ SOLN
INTRAMUSCULAR | Status: AC
  Filled 2016-11-18: qty 2

## 2016-11-18 MED ORDER — MIDAZOLAM HCL 5 MG/5ML IJ SOLN
INTRAMUSCULAR | Status: DC | PRN
  Administered 2016-11-18: 2 mg via INTRAVENOUS

## 2016-11-18 MED ORDER — EPHEDRINE 5 MG/ML INJ
INTRAVENOUS | Status: AC
  Filled 2016-11-18: qty 10

## 2016-11-18 MED ORDER — SODIUM CHLORIDE 0.9 % IR SOLN
Status: DC | PRN
  Administered 2016-11-18: 6000 mL

## 2016-11-18 MED ORDER — PROPOFOL 10 MG/ML IV BOLUS
INTRAVENOUS | Status: AC
  Filled 2016-11-18: qty 20

## 2016-11-18 MED ORDER — FENTANYL CITRATE (PF) 100 MCG/2ML IJ SOLN
INTRAMUSCULAR | Status: DC | PRN
  Administered 2016-11-18: 50 ug via INTRAVENOUS
  Administered 2016-11-18 (×2): 25 ug via INTRAVENOUS

## 2016-11-18 SURGICAL SUPPLY — 24 items
BAG URO CATCHER STRL LF (MISCELLANEOUS) ×3 IMPLANT
BASKET ZERO TIP NITINOL 2.4FR (BASKET) ×1 IMPLANT
BSKT STON RTRVL ZERO TP 2.4FR (BASKET)
CATH INTERMIT  6FR 70CM (CATHETERS) ×3 IMPLANT
CATH URET 5FR 28IN CONE TIP (BALLOONS) ×2
CATH URET 5FR 70CM CONE TIP (BALLOONS) ×1 IMPLANT
CATH URET WHISTLE 6FR (CATHETERS) ×3 IMPLANT
CLOTH BEACON ORANGE TIMEOUT ST (SAFETY) ×3 IMPLANT
COVER FOOTSWITCH UNIV (MISCELLANEOUS) IMPLANT
COVER SURGICAL LIGHT HANDLE (MISCELLANEOUS) ×3 IMPLANT
FIBER LASER TRAC TIP (UROLOGICAL SUPPLIES) ×2 IMPLANT
GLOVE BIO SURGEON STRL SZ7.5 (GLOVE) ×3 IMPLANT
GOWN STRL REUS W/TWL XL LVL3 (GOWN DISPOSABLE) ×3 IMPLANT
GUIDEWIRE ANG ZIPWIRE 038X150 (WIRE) ×2 IMPLANT
GUIDEWIRE STR DUAL SENSOR (WIRE) ×3 IMPLANT
MANIFOLD NEPTUNE II (INSTRUMENTS) ×3 IMPLANT
PACK CYSTO (CUSTOM PROCEDURE TRAY) ×3 IMPLANT
SHEATH ACCESS URETERAL 24CM (SHEATH) ×1 IMPLANT
SHEATH ACCESS URETERAL 38CM (SHEATH) ×1 IMPLANT
SHEATH ACCESS URETERAL 54CM (SHEATH) ×2 IMPLANT
STENT URET 6FRX26 CONTOUR (STENTS) ×2 IMPLANT
TUBING CONNECTING 10 (TUBING) ×2 IMPLANT
TUBING CONNECTING 10' (TUBING) ×1
WIRE COONS/BENSON .038X145CM (WIRE) ×3 IMPLANT

## 2016-11-18 NOTE — Anesthesia Postprocedure Evaluation (Signed)
Anesthesia Post Note  Patient: COREE BRAME  Procedure(s) Performed: Procedure(s) (LRB): URETEROSCOPY WITH HOLMIUM LASER LITHOTRIPSY/STENT PLACEMENT (Right)     Patient location during evaluation: PACU Anesthesia Type: General Level of consciousness: awake and alert Pain management: pain level controlled Vital Signs Assessment: post-procedure vital signs reviewed and stable Respiratory status: spontaneous breathing, nonlabored ventilation, respiratory function stable and patient connected to nasal cannula oxygen Cardiovascular status: blood pressure returned to baseline and stable Postop Assessment: no apparent nausea or vomiting Anesthetic complications: no    Last Vitals:  Vitals:   11/18/16 0918 11/18/16 1245  BP: (!) 168/96 (!) 161/90  Pulse: (!) 56 (!) 49  Resp: 20 18  Temp: 36.8 C (!) 36.3 C  SpO2: 99% 100%    Last Pain:  Vitals:   11/18/16 1245  TempSrc:   PainSc: Asleep                 Eziah Negro

## 2016-11-18 NOTE — Interval H&P Note (Signed)
History and Physical Interval Note:  11/18/2016 10:48 AM  Darrell Perkins  has presented today for surgery, with the diagnosis of RIGHT PROXIMAL URETERAL STONE  The various methods of treatment have been discussed with the patient and family. After consideration of risks, benefits and other options for treatment, the patient has consented to  Procedure(s): URETEROSCOPY WITH HOLMIUM LASER LITHOTRIPSY/STENT PLACEMENT (N/A) as a surgical intervention .  The patient's history has been reviewed, patient examined, no change in status, stable for surgery.  I have reviewed the patient's chart and labs.  No fever or dysuria. Discussed again staged procedure, ureteral injury among other risks. Questions were answered to the patient's satisfaction.     Natassia Guthridge

## 2016-11-18 NOTE — Anesthesia Preprocedure Evaluation (Signed)
Anesthesia Evaluation  Patient identified by MRN, date of birth, ID band Patient awake    Reviewed: Allergy & Precautions, NPO status , Patient's Chart, lab work & pertinent test results  Airway Mallampati: II  TM Distance: >3 FB Neck ROM: Full    Dental no notable dental hx.    Pulmonary asthma , sleep apnea and Continuous Positive Airway Pressure Ventilation , pneumonia, resolved,    Pulmonary exam normal breath sounds clear to auscultation       Cardiovascular Exercise Tolerance: Good hypertension, Pt. on medications negative cardio ROS Normal cardiovascular exam+ dysrhythmias Atrial Fibrillation  Rhythm:Regular Rate:Normal     Neuro/Psych negative neurological ROS  negative psych ROS   GI/Hepatic Neg liver ROS, GERD  ,  Endo/Other  negative endocrine ROS  Renal/GU Renal disease  negative genitourinary   Musculoskeletal negative musculoskeletal ROS (+) Arthritis , Osteoarthritis,    Abdominal (+) + obese,   Peds negative pediatric ROS (+)  Hematology negative hematology ROS (+)   Anesthesia Other Findings EKG 9/18 Sinus bradycardia with sinus arrhythmia Since previous tracing Sinus bradycardia has replaced Atrial flutter  Echo 8/30    Left ventricle:  The cavity size was normal. There was moderate concentric hypertrophy. Systolic function was mildly to moderately reduced. The estimated ejection fraction was in the range of 40% to 45%. Diffuse hypokinesis. The study is not technically sufficient to allow evaluation of LV diastolic function.   Reproductive/Obstetrics negative OB ROS                             Anesthesia Physical  Anesthesia Plan  ASA: II  Anesthesia Plan: General   Post-op Pain Management:    Induction: Intravenous  PONV Risk Score and Plan: 2 and Ondansetron, Dexamethasone, Treatment may vary due to age or medical condition and Midazolam  Airway  Management Planned: Oral ETT  Additional Equipment:   Intra-op Plan:   Post-operative Plan: Extubation in OR  Informed Consent: I have reviewed the patients History and Physical, chart, labs and discussed the procedure including the risks, benefits and alternatives for the proposed anesthesia with the patient or authorized representative who has indicated his/her understanding and acceptance.   Dental advisory given  Plan Discussed with: CRNA  Anesthesia Plan Comments: (  )        Anesthesia Quick Evaluation

## 2016-11-18 NOTE — Anesthesia Procedure Notes (Signed)
Procedure Name: LMA Insertion Date/Time: 11/18/2016 11:00 AM Performed by: Jhonnie Garner Pre-anesthesia Checklist: Patient identified, Emergency Drugs available, Suction available and Patient being monitored Patient Re-evaluated:Patient Re-evaluated prior to induction Oxygen Delivery Method: Circle system utilized Preoxygenation: Pre-oxygenation with 100% oxygen Induction Type: IV induction Ventilation: Mask ventilation without difficulty LMA: LMA inserted LMA Size: 5.0 Placement Confirmation: positive ETCO2 and breath sounds checked- equal and bilateral Tube secured with: Tape Dental Injury: Teeth and Oropharynx as per pre-operative assessment

## 2016-11-18 NOTE — Progress Notes (Signed)
Dr. Tacy Dura at patients bedside to assess HR and rhythm at this time. Pt is alert, calm and has no s/s of distress. VS and orders assessed and will continue to monitor and tx pt according MD orders.

## 2016-11-18 NOTE — Discharge Instructions (Signed)
Ureteral Stent Implantation, Care After Refer to this sheet in the next few weeks. These instructions provide you with information about caring for yourself after your procedure. Your health care provider may also give you more specific instructions. Your treatment has been planned according to current medical practices, but problems sometimes occur. Call your health care provider if you have any problems or questions after your procedure.  Removal of the stent: Remove the stent by pulling the string with slow steady pressure on Wednesday morning, 11/23/2016.   What can I expect after the procedure? After the procedure, it is common to have:  Nausea.  Mild pain when you urinate. You may feel this pain in your lower back or lower abdomen. Pain should stop within a few minutes after you urinate. This may last for up to 1 week.  A small amount of blood in your urine for several days.  Follow these instructions at home:  Medicines  Take over-the-counter and prescription medicines only as told by your health care provider.  If you were prescribed an antibiotic medicine, take it as told by your health care provider. Do not stop taking the antibiotic even if you start to feel better.  Do not drive for 24 hours if you received a sedative.  Do not drive or operate heavy machinery while taking prescription pain medicines. Activity  Return to your normal activities as told by your health care provider. Ask your health care provider what activities are safe for you.  Do not lift anything that is heavier than 10 lb (4.5 kg). Follow this limit for 1 week after your procedure, or for as long as told by your health care provider. General instructions  Watch for any blood in your urine. Call your health care provider if the amount of blood in your urine increases.  If you have a catheter: ? Follow instructions from your health care provider about taking care of your catheter and collection  bag. ? Do not take baths, swim, or use a hot tub until your health care provider approves.  Drink enough fluid to keep your urine clear or pale yellow.  Keep all follow-up visits as told by your health care provider. This is important. Contact a health care provider if:  You have pain that gets worse or does not get better with medicine, especially pain when you urinate.  You have difficulty urinating.  You feel nauseous or you vomit repeatedly during a period of more than 2 days after the procedure. Get help right away if:  Your urine is dark red or has blood clots in it.  You are leaking urine (have incontinence).  The end of the stent comes out of your urethra.  You cannot urinate.  You have sudden, sharp, or severe pain in your abdomen or lower back.  You have a fever. This information is not intended to replace advice given to you by your health care provider. Make sure you discuss any questions you have with your health care provider. Document Released: 10/24/2012 Document Revised: 07/30/2015 Document Reviewed: 09/05/2014 Elsevier Interactive Patient Education  2018 [employer].     General Anesthesia, Adult, Care After These instructions provide you with information about caring for yourself after your procedure. Your health care provider may also give you more specific instructions. Your treatment has been planned according to current medical practices, but problems sometimes occur. Call your health care provider if you have any problems or questions after your procedure. What can I expect  after the procedure? After the procedure, it is common to have:  Vomiting.  A sore throat.  Mental slowness.  It is common to feel:  Nauseous.  Cold or shivery.  Sleepy.  Tired.  Sore or achy, even in parts of your body where you did not have surgery.  Follow these instructions at home: For at least 24 hours after the procedure:  Do not: ? Participate in  activities where you could fall or become injured. ? Drive. ? Use heavy machinery. ? Drink alcohol. ? Take sleeping pills or medicines that cause drowsiness. ? Make important decisions or sign legal documents. ? Take care of children on your own.  Rest. Eating and drinking  If you vomit, drink water, juice, or soup when you can drink without vomiting.  Drink enough fluid to keep your urine clear or pale yellow.  Make sure you have little or no nausea before eating solid foods.  Follow the diet recommended by your health care provider. General instructions  Have a responsible adult stay with you until you are awake and alert.  Return to your normal activities as told by your health care provider. Ask your health care provider what activities are safe for you.  Take over-the-counter and prescription medicines only as told by your health care provider.  If you smoke, do not smoke without supervision.  Keep all follow-up visits as told by your health care provider. This is important. Contact a health care provider if:  You continue to have nausea or vomiting at home, and medicines are not helpful.  You cannot drink fluids or start eating again.  You cannot urinate after 8-12 hours.  You develop a skin rash.  You have fever.  You have increasing redness at the site of your procedure. Get help right away if:  You have difficulty breathing.  You have chest pain.  You have unexpected bleeding.  You feel that you are having a life-threatening or urgent problem. This information is not intended to replace advice given to you by your health care provider. Make sure you discuss any questions you have with your health care provider. Document Released: 05/30/2000 Document Revised: 07/27/2015 Document Reviewed: 02/05/2015 Elsevier Interactive Patient Education  [employer].

## 2016-11-18 NOTE — Op Note (Signed)
Preoperative diagnosis: Right proximal ureteral stone, right renal stone Postoperative diagnosis: Same  Procedure: Cystoscopy with right retrograde pyelogram, right ureteroscopy holmium laser lithotripsy and right ureteral stent placement, exam under anesthesia  Surgeon: Mena Goes  Anesthesia: Gen.  Indication for procedure: 66 year old African-American male whois known to form calcium oxalate/uric acid stones who presented with a large 20 x 10 mm stone in the right proximal ureter as well as a 6-7 mm right midpole stone.the ureteral stone was causing moderate to severe hydronephrosis.  Findings: On cystoscopy the urethra, prostatic urethra and bladder were unremarkable. There were no stones or foreign bodies in the bladder. The mucosa appeared normal. The trigone and ureteral orifices appeared normal.  Right retrograde pyelogram-this outlined a single ureter single collecting system unit, possible filling defect versus air bubble in the right distal ureter and a large filling defect in the right proximal ureter consistent with the known stone. Very little contrast passed proximally. The stone was faintly visible on the scout image.  On ureteroscopy there was no stone in the distal or mid ureter and the right proximal stone had been pushed into the renal pelvis.  Description of procedure: After consent was obtained the patient was brought to the operating room. After adequate anesthesia he was placed in lithotomy position and prepped and draped in the usual sterile fashion. A timeout was performed to confirm the patient and procedure.The cystoscope was passed per urethra the right ureteral orifice cannulated with a 6 [place] open-ended catheter after inspecting the bladder. Retrograde injection of contrast was performed. A sensor wire was then advanced and coiled in the collecting system. Adjacent to the sensor wire I passed the semirigid ureteroscope up to the iliacs and confirmed no stone in the  distal ureter. Under direct vision a Glidewire was advanced through the scope and coiled in the collecting system under fluoroscopy. The semirigid was backed out. Under continuous fluoroscopy along access sheath was passed without difficulty into the proximal ureter and proximal to where anticipated the stone to be. I then passed the dual channel digital ureteroscope and the wire said push the stone into the renal pelvis. Using the continuous pressure flow pump with the ureteroscope the laser fiber was deployed and using a setting of 0.4 and 50 and 0.8 and 8 I was able to fragment the large stone in the very small pieces and then fragment the midpole stone. I then used a combination of laser settings and increased the pressure on the pump to 150 which stirred up the pieces and provided excellent fragmentation. There were no pieces greater than 1 or 2 mm. Careful inspection of the lower and midpole calyces as well as the upper pole noted no other significant stone fragments. Therefore I backed out and inspected the renal pelvis which was normal, the proximal ureter appeared normal and the access sheath and the scope or backed out together in theureter inspected and noted to be normal without stone fragment. The wire was backloaded on the cystoscope and a 6 x 26 cm stent was advanced. The wire was removed with a good coil seen in the kidney and a good coil in the bladder. The bladder was drained and the scope removed. He was awakened and taken to the recovery room in stable condition.  Complications: None Blood loss: Minimal Specimens: None Drains: 6 x 26 cm right ureteral stent with string

## 2016-11-18 NOTE — Transfer of Care (Signed)
Immediate Anesthesia Transfer of Care Note  Patient: Darrell Perkins  Procedure(s) Performed: Procedure(s): URETEROSCOPY WITH HOLMIUM LASER LITHOTRIPSY/STENT PLACEMENT (Right)  Patient Location: PACU  Anesthesia Type:General  Level of Consciousness:  sedated, patient cooperative and responds to stimulation  Airway & Oxygen Therapy:Patient Spontanous Breathing and Patient connected to face mask oxgen  Post-op Assessment:  Report given to PACU RN and Post -op Vital signs reviewed and stable  Post vital signs:  Reviewed and stable  Last Vitals:  Vitals:   11/18/16 0918  BP: (!) 168/96  Pulse: (!) 56  Resp: 20  Temp: 36.8 C  SpO2: 99%    Complications: No apparent anesthesia complications

## 2016-11-21 ENCOUNTER — Encounter (HOSPITAL_COMMUNITY): Payer: Self-pay | Admitting: Urology

## 2016-11-30 ENCOUNTER — Encounter: Payer: Self-pay | Admitting: Internal Medicine

## 2016-11-30 ENCOUNTER — Ambulatory Visit (INDEPENDENT_AMBULATORY_CARE_PROVIDER_SITE_OTHER): Payer: No Typology Code available for payment source | Admitting: Internal Medicine

## 2016-11-30 VITALS — BP 180/84 | HR 54 | Ht 74.5 in | Wt 263.0 lb

## 2016-11-30 DIAGNOSIS — R001 Bradycardia, unspecified: Secondary | ICD-10-CM | POA: Diagnosis not present

## 2016-11-30 DIAGNOSIS — I483 Typical atrial flutter: Secondary | ICD-10-CM | POA: Diagnosis not present

## 2016-11-30 DIAGNOSIS — I110 Hypertensive heart disease with heart failure: Secondary | ICD-10-CM

## 2016-11-30 DIAGNOSIS — E291 Testicular hypofunction: Secondary | ICD-10-CM | POA: Diagnosis not present

## 2016-11-30 DIAGNOSIS — I5022 Chronic systolic (congestive) heart failure: Secondary | ICD-10-CM

## 2016-11-30 DIAGNOSIS — I4892 Unspecified atrial flutter: Secondary | ICD-10-CM

## 2016-11-30 DIAGNOSIS — I5020 Unspecified systolic (congestive) heart failure: Secondary | ICD-10-CM | POA: Diagnosis not present

## 2016-11-30 NOTE — Progress Notes (Signed)
I'll send him back when he is ready... MCr

## 2016-11-30 NOTE — Progress Notes (Signed)
Electrophysiology Office Note   Date:  11/30/2016   ID:  Dhanush, Jokerst 1950/07/01, MRN [id number]  PCP:  [name], MD  Cardiologist:  Dr Royann Shivers Primary Electrophysiologist: Hillis Range, MD    CC: atrial flutter   History of Present Illness: Darrell Perkins is a 66 y.o. male who presents today for electrophysiology evaluation.   He is referred by Dr Royann Shivers for EP consultation regarding atrial flutter.  He reports initial heart racing 25 years ago.  Episodes have been very infrequent since that time.  Though episodes have been called "atrial fibrillation" these have not been well documented.  Recently, he presented to [facility] with typical appearing atrial flutter.  He was evaluated by Dr Royann Shivers and initiated on xarelto.  He spontaneously converted to sinus rhythm.  He had difficulty with kidney stones and has since stopped xarelto.  He ha had no further symptoms of arrhythmias.   Today, he denies symptoms of palpitations, chest pain, shortness of breath, orthopnea, PND, lower extremity edema, claudication, dizziness, presyncope, syncope, bleeding, or neurologic sequela. The patient is tolerating medications without difficulties and is otherwise without complaint today.    Past Medical History:  Diagnosis Date  . Bronchitis    hx of   . CHF (congestive heart failure) (HCC)    EF 45%  . 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 GERD (gastroesophageal reflux disease)   . Kidney stones   . Pneumonia    hx of   . Rapid atrial fibrillation (HCC) 11/26/2014  . Sleep apnea    USES CPAP  . Typical atrial flutter (HCC) 10/2016   Past Surgical History:  Procedure Laterality Date  . colonscopy     . CYSTOSCOPY WITH RETROGRADE PYELOGRAM, URETEROSCOPY AND STENT PLACEMENT Right 08/01/2014   Procedure: CYSTOSCOPY WITH RIGHT  RETROGRADE PYELOGRAM/RIGHT URETEROSCOPY HOLMIUMN LASER LITOTRIPSY  STENT PLACEMENT ;  Surgeon: Jerilee Field, MD;  Location: [facility];  Service:  Urology;  Laterality: Right;  . URETEROSCOPY WITH HOLMIUM LASER LITHOTRIPSY Right 11/18/2016   Procedure: URETEROSCOPY WITH HOLMIUM LASER LITHOTRIPSY/STENT PLACEMENT;  Surgeon: Jerilee Field, MD;  Location: [facility];  Service: Urology;  Laterality: Right;     Current Outpatient Prescriptions  Medication Sig Dispense Refill  . acetaminophen (TYLENOL) 650 MG CR tablet Take 650 mg by mouth 2 (two) times daily as needed for pain.    Marland Kitchen albuterol (PROVENTIL HFA;VENTOLIN HFA) 108 (90 BASE) MCG/ACT inhaler Inhale 1-2 puffs into the lungs every 6 (six) hours as needed for wheezing. 1 Inhaler 0  . Bee Pollen 580 MG CAPS Take 1,160 mg by mouth daily.     . budesonide-formoterol (SYMBICORT) 160-4.5 MCG/ACT inhaler Inhale 2 puffs into the lungs every other day.     . losartan (COZAAR) 100 MG tablet Take 100 mg by mouth daily.  3  . meloxicam (MOBIC) 15 MG tablet Take 15 mg by mouth daily as needed for pain.   1  . oxyCODONE-acetaminophen (PERCOCET/ROXICET) 5-325 MG tablet Take 1 tablet by mouth every 6 (six) hours as needed for severe pain. 15 tablet 0   No current facility-administered medications for this visit.     Allergies:   Allopurinol; Aspirin; Prednisone; Rosuvastatin; Simvastatin; and Tuberculin tests   Social History:  The patient  reports that he has never smoked. He has never used smokeless tobacco. He reports that he does not drink alcohol or use drugs.   Family History:  The patient's  family history includes Alcohol abuse in his  father; COPD in his father; Cerebrovascular Disease in his father; Diabetes in his father; Hypertension in his father and mother.    ROS:  Please see the history of present illness.   All other systems are personally reviewed and negative.    PHYSICAL EXAM: VS:  BP (!) 180/84   Pulse (!) 54   Ht 6' 2.5" (1.892 m)   Wt 263 lb (119.3 kg)   SpO2 98%   BMI 33.32 kg/m  , BMI Body mass index is 33.32 kg/m. GEN: Well nourished, well developed, in no acute  distress  HEENT: normal  Neck: no JVD, carotid bruits, or masses Cardiac: RRR; no murmurs, rubs, or gallops,no edema  Respiratory:  clear to auscultation bilaterally, normal work of breathing GI: soft, nontender, nondistended, + BS MS: no deformity or atrophy  Skin: warm and dry  Neuro:  Strength and sensation are intact Psych: euthymic mood, full affect  EKG:  EKG is ordered today. The ekg ordered today is personally reviewed and shows sinus bradycardia 54 bpm, PACs,    Recent Labs: 11/02/2016: TSH 1.494 11/04/2016: ALT 14; Magnesium 2.0 11/12/2016: Hemoglobin 13.5; Platelets 235 11/18/2016: BUN 25; Creatinine, Ser 2.81; Potassium 4.3; Sodium 138  personally reviewed   Lipid Panel  No results found for: CHOL, TRIG, HDL, CHOLHDL, VLDL, LDLCALC, LDLDIRECT personally reviewed   Wt Readings from Last 3 Encounters:  11/30/16 263 lb (119.3 kg)  11/18/16 263 lb 8 oz (119.5 kg)  11/12/16 260 lb (117.9 kg)      Other studies personally reviewed: Additional studies/ records that were reviewed today include: echo, hospital records, ekg 10/3016   Review of the above records today demonstrates: typical appearing atrial flutter, EF 45%   ASSESSMENT AND PLAN:  1.  Typical appearing atrial flutter The patient has infrequent palpitations.  Though he has been told that he had "afib", this is not well documented. Recent ekg 11/03/16 reveals typical atrial flutter. chads2vas score is at least 3.  He has stopped xarelto due to kidney stones. Therapeutic strategies for atrial flutter including medicine and ablation were discussed in detail with the patient today. Risk, benefits, and alternatives to EP study and radiofrequency ablation were also discussed in detail today.  At this time, he is not sure that he wants to proceed with ablation.  He will discuss with his wife and contact my office if he decides to proceed  2. Reduced EF EF 40-45% by echo 11/03/16 in the setting of atrial flutter.  EF was  normal 12/1014.  He is on ARB Unable to take beta blocker due to bradycardia Dr Royann Shivers to follow-up with repeat echo  3. Hypertensive cardiovascular disease Very elevated BP today He attributes this to "nervousness" and does not wish to have medicines adjusted today He does not check his BP at home. I have encouraged him to follow his BP closely and let us know if it remains elevated.  4. OSA Compliance with CPAP encouraged  Follow-up:  He will contact my office if he decides to proceed with atrial flutter ablation He will follow-up with Dr Royann Shivers in 2 months I will see as needed going forward  Current medicines are reviewed at length with the patient today.   The patient does not have concerns regarding his medicines.  The following changes were made today:     Labs/ tests ordered today include:  Orders Placed This Encounter  Procedures  . EKG 12-Lead     Signed, Hillis Range, MD  11/30/2016 10:27  AM     [facility] [address] [place] [address] [phone] (office) [phone] (fax)

## 2016-11-30 NOTE — Patient Instructions (Addendum)
Medication Instructions:  Your physician recommends that you continue on your current medications as directed. Please refer to the Current Medication list given to you today.   Labwork: None ordered   Testing/Procedures None ordered  Recommended flutter ablation. Call back if you would like to proceed.  Follow-Up: Your physician recommends that you schedule a follow-up appointment in: 2 months with Dr. Royann Shivers   Any Other Special Instructions Will Be Listed Below (If Applicable).     If you need a refill on your cardiac medications before your next appointment, please call your pharmacy.

## 2016-12-21 DIAGNOSIS — E291 Testicular hypofunction: Secondary | ICD-10-CM | POA: Diagnosis not present

## 2017-01-04 DIAGNOSIS — E291 Testicular hypofunction: Secondary | ICD-10-CM | POA: Diagnosis not present

## 2017-01-09 DIAGNOSIS — N200 Calculus of kidney: Secondary | ICD-10-CM | POA: Diagnosis not present

## 2017-02-13 ENCOUNTER — Ambulatory Visit (INDEPENDENT_AMBULATORY_CARE_PROVIDER_SITE_OTHER): Payer: Self-pay | Admitting: Orthopaedic Surgery

## 2017-03-06 ENCOUNTER — Encounter (INDEPENDENT_AMBULATORY_CARE_PROVIDER_SITE_OTHER): Payer: Self-pay | Admitting: Orthopaedic Surgery

## 2017-03-06 ENCOUNTER — Ambulatory Visit (INDEPENDENT_AMBULATORY_CARE_PROVIDER_SITE_OTHER): Payer: No Typology Code available for payment source | Admitting: Orthopaedic Surgery

## 2017-03-06 ENCOUNTER — Ambulatory Visit (INDEPENDENT_AMBULATORY_CARE_PROVIDER_SITE_OTHER): Payer: No Typology Code available for payment source

## 2017-03-06 DIAGNOSIS — M25562 Pain in left knee: Secondary | ICD-10-CM

## 2017-03-06 DIAGNOSIS — G8929 Other chronic pain: Secondary | ICD-10-CM | POA: Diagnosis not present

## 2017-03-06 DIAGNOSIS — M1712 Unilateral primary osteoarthritis, left knee: Secondary | ICD-10-CM | POA: Diagnosis not present

## 2017-03-06 MED ORDER — LIDOCAINE HCL 1 % IJ SOLN
3.0000 mL | INTRAMUSCULAR | Status: AC | PRN
  Administered 2017-03-06: 3 mL

## 2017-03-06 MED ORDER — METHYLPREDNISOLONE ACETATE 40 MG/ML IJ SUSP
40.0000 mg | INTRAMUSCULAR | Status: AC | PRN
  Administered 2017-03-06: 40 mg via INTRA_ARTICULAR

## 2017-03-06 NOTE — Progress Notes (Signed)
Office Visit Note   Patient: Darrell Perkins           Date of Birth: 1950/04/06           MRN: [id number] Visit Date: 03/06/2017              Requested by: Marden NobleGates, Robert, MD [address] [place], [place] [id number] PCP: Marden NobleGates, Robert, MD   Assessment & Plan: Visit Diagnoses:  1. Chronic pain of left knee   2. Unilateral primary osteoarthritis, left knee     Plan: I showed him his knee x-rays and went over in detail with the disease process is with his knees.  He wants to try to stay as conservative as possible in light of the severe end-stage arthritis.  I told him there is no harm in trying at least injections in his knee.  I talked about the risk and benefits of steroid injection and provided one in the office today without difficulty.  We will see him back in a month and at that visit hopefully try hyaluronic acid for that knee.  All questions concerns were answered and addressed.+++  Follow-Up Instructions: Return in about 4 weeks (around 04/03/2017).   Orders:  Orders Placed This Encounter  Procedures  . Large Joint Inj  . XR Knee 1-2 Views Left   No orders of the defined types were placed in this encounter.     Procedures: Large Joint Inj: L knee on 03/06/2017 10:22 AM Indications: diagnostic evaluation and pain Details: 22 G 1.5 in needle, superolateral approach  Arthrogram: No  Medications: 3 mL lidocaine 1 %; 40 mg methylPREDNISolone acetate 40 MG/ML Outcome: tolerated well, no immediate complications Procedure, treatment alternatives, risks and benefits explained, specific risks discussed. Consent was given by the patient. Immediately prior to procedure a time out was called to verify the correct patient, procedure, equipment, support staff and site/side marked as required. Patient was prepped and draped in the usual sterile fashion.       Clinical Data: No additional findings.   Subjective: Chief Complaint  Patient presents with  . Left Knee  - Pain  Patient somewhat I am seeing for the first time.  This is for his left knee.  He is having years worth of worsening left knee pain.  He has been told in the past that he has severe arthritis in that knee.  He says is gotten to where walking is very painful.  He denies any specific swelling though.  He said he is injured that knee a lot of years.  He denies any locking catching is mainly a grinding type of pain.  Is worse if he is been sitting for long period of time and gets up to move.  He points mainly to the lateral aspect of his source of pain in his knee but he says it grinds quite a bit as well.  HPI  Review of Systems He currently denies any headache, chest pain, shortness of breath, fever, chills, nausea, vomiting.  Objective: Vital Signs: There were no vitals taken for this visit.  Physical Exam He is alert and oriented x3 and in no acute distress Ortho Exam Examination of both his knees does show valgus malalignment of his knees.  His left knee has severe lateral joint line tenderness.  There is quite severe patellofemoral crepitation as well as lateral grinding at the iliotibial band and lateral joint line in general. Specialty Comments:  No specialty comments available.  Imaging:  Xr Knee 1-2 Views Left  Result Date: 03/06/2017 2 views and AP and lateral of the left knee show severe end-stage arthritis with valgus malalignment.  There is complete loss of the lateral joint space.  There is periarticular osteophytes throughout all 3 compartments of the knee.    PMFS History: Patient Active Problem List   Diagnosis Date Noted  . Chronic pain of left knee 03/06/2017  . Unilateral primary osteoarthritis, left knee 03/06/2017  . Leukopenia 11/03/2016  . Atrial fibrillation with RVR (HCC) 11/02/2016  . Allergic rhinitis 11/26/2014  . Cutaneous eruption 11/26/2014  . Uncomplicated asthma 11/26/2014  . Asthma, extrinsic, without status asthmaticus 11/26/2014  .  Nephrolithiasis 11/26/2014  . Cellulitis and abscess of leg 11/26/2014  . ED (erectile dysfunction) of organic origin 11/26/2014  . Benign essential HTN 11/26/2014  . Peripheral neuralgia 11/26/2014  . Cephalalgia 11/26/2014  . Tinea cruris 11/26/2014  . Folliculitis 11/26/2014  . Hypercholesterolemia without hypertriglyceridemia 11/26/2014  . Hematuria, microscopic 11/26/2014  . H/O adenomatous polyp of colon 11/26/2014  . Snoring 11/26/2014  . Difficulty staying awake 11/26/2014  . Essential hypertension 11/26/2014  . Combined fat and carbohydrate induced hyperlipemia 11/26/2014  . Tinea pedis 11/26/2014  . Erectile dysfunction due to arterial insufficiency 11/26/2014  . Perifolliculitis capitis abscedens 11/26/2014  . Other constipation 11/26/2014  . Chest pain 11/26/2014  . Other fatigue 11/26/2014  . Pain of right leg 11/26/2014  . Lesion of lateral popliteal nerve 11/26/2014  . Chronic kidney disease, stage III (moderate) (HCC) 11/26/2014  . 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 Athlete's foot 11/26/2014  . Mixed hyperlipidemia 11/26/2014  . Adiposity 11/26/2014  . Polypharmacy 11/26/2014  . 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 Chest pain syndrome 11/26/2014  . Rapid atrial fibrillation (HCC) 11/26/2014  . OSA on CPAP 11/26/2014  . Encounter for general adult medical examination without abnormal findings 09/02/2014   Past Medical History:  Diagnosis Date  . Bronchitis    hx of   . CHF (congestive heart failure) (HCC)    EF 45%  . 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 GERD (gastroesophageal reflux disease)   . Hypertensive cardiovascular disease   . Kidney stones   . Pneumonia    hx of   . Rapid atrial fibrillation (HCC) 11/26/2014  . Sleep apnea    USES CPAP  . Typical atrial flutter (HCC) 10/2016    Family History  Problem Relation Age of Onset  . Hypertension Mother   . Diabetes Father   . Cerebrovascular Disease Father   . Alcohol  abuse Father   . Hypertension Father   . COPD Father     Past Surgical History:  Procedure Laterality Date  . colonscopy     . CYSTOSCOPY WITH RETROGRADE PYELOGRAM, URETEROSCOPY AND STENT PLACEMENT Right 08/01/2014   Procedure: CYSTOSCOPY WITH RIGHT  RETROGRADE PYELOGRAM/RIGHT URETEROSCOPY HOLMIUMN LASER LITOTRIPSY  STENT PLACEMENT ;  Surgeon: Jerilee FieldMatthew Eskridge, MD;  Location: [facility];  Service: Urology;  Laterality: Right;  . URETEROSCOPY WITH HOLMIUM LASER LITHOTRIPSY Right 11/18/2016   Procedure: URETEROSCOPY WITH HOLMIUM LASER LITHOTRIPSY/STENT PLACEMENT;  Surgeon: Jerilee FieldEskridge, Matthew, MD;  Location: [facility];  Service: Urology;  Laterality: Right;   Social History   Occupational History  . Not on file  Tobacco Use  . Smoking status: Never Smoker  . Smokeless tobacco: Never Used  Substance and Sexual Activity  . Alcohol use: No  . Drug use: No  . Sexual activity: Not on file

## 2017-03-13 DIAGNOSIS — E291 Testicular hypofunction: Secondary | ICD-10-CM | POA: Diagnosis not present

## 2017-03-15 ENCOUNTER — Telehealth (INDEPENDENT_AMBULATORY_CARE_PROVIDER_SITE_OTHER): Payer: Self-pay | Admitting: Orthopaedic Surgery

## 2017-03-15 NOTE — Telephone Encounter (Signed)
error 

## 2017-03-27 DIAGNOSIS — E291 Testicular hypofunction: Secondary | ICD-10-CM | POA: Diagnosis not present

## 2017-04-03 ENCOUNTER — Encounter (INDEPENDENT_AMBULATORY_CARE_PROVIDER_SITE_OTHER): Payer: Self-pay | Admitting: Orthopaedic Surgery

## 2017-04-03 ENCOUNTER — Ambulatory Visit (INDEPENDENT_AMBULATORY_CARE_PROVIDER_SITE_OTHER): Payer: No Typology Code available for payment source | Admitting: Orthopaedic Surgery

## 2017-04-03 DIAGNOSIS — M1712 Unilateral primary osteoarthritis, left knee: Secondary | ICD-10-CM | POA: Diagnosis not present

## 2017-04-03 MED ORDER — HYALURONAN 88 MG/4ML IX SOSY
88.0000 mg | PREFILLED_SYRINGE | INTRA_ARTICULAR | Status: AC | PRN
  Administered 2017-04-03: 88 mg via INTRA_ARTICULAR

## 2017-04-03 NOTE — Progress Notes (Signed)
   Procedure Note  Patient: Darrell Perkins             Date of Birth: 1950-10-26           MRN: [id number]             Visit Date: 04/03/2017  Procedures: Visit Diagnoses: Unilateral primary osteoarthritis, left knee  Large Joint Inj: L knee on 04/03/2017 10:26 AM Indications: pain and diagnostic evaluation Details: 22 G 1.5 in needle, superolateral approach  Arthrogram: No  Medications: 88 mg Hyaluronan 88 MG/4ML Outcome: tolerated well, no immediate complications Procedure, treatment alternatives, risks and benefits explained, specific risks discussed. Consent was given by the patient. Immediately prior to procedure a time out was called to verify the correct patient, procedure, equipment, support staff and site/side marked as required. Patient was prepped and draped in the usual sterile fashion.     The patient is here for scheduled hyaluronic acid injection with Monovisc in his left knee.  He has significant arthritic findings in that knee.  He said the steroid injection did not really help at all.  We talked at length about what the goal with trying hyaluronic acid is.  He tolerated the injection well.  We will see him back in 3 months to see how he is doing overall.

## 2017-04-10 DIAGNOSIS — E291 Testicular hypofunction: Secondary | ICD-10-CM | POA: Diagnosis not present

## 2017-04-27 DIAGNOSIS — J209 Acute bronchitis, unspecified: Secondary | ICD-10-CM | POA: Diagnosis not present

## 2017-04-27 DIAGNOSIS — R509 Fever, unspecified: Secondary | ICD-10-CM | POA: Diagnosis not present

## 2017-05-19 DIAGNOSIS — H6503 Acute serous otitis media, bilateral: Secondary | ICD-10-CM | POA: Diagnosis not present

## 2017-05-19 DIAGNOSIS — J301 Allergic rhinitis due to pollen: Secondary | ICD-10-CM | POA: Diagnosis not present

## 2017-05-31 DIAGNOSIS — R269 Unspecified abnormalities of gait and mobility: Secondary | ICD-10-CM | POA: Diagnosis not present

## 2017-05-31 DIAGNOSIS — M21062 Valgus deformity, not elsewhere classified, left knee: Secondary | ICD-10-CM | POA: Diagnosis not present

## 2017-05-31 DIAGNOSIS — M25561 Pain in right knee: Secondary | ICD-10-CM | POA: Diagnosis not present

## 2017-05-31 DIAGNOSIS — M21061 Valgus deformity, not elsewhere classified, right knee: Secondary | ICD-10-CM | POA: Diagnosis not present

## 2017-05-31 DIAGNOSIS — M1712 Unilateral primary osteoarthritis, left knee: Secondary | ICD-10-CM | POA: Diagnosis not present

## 2017-05-31 DIAGNOSIS — M25562 Pain in left knee: Secondary | ICD-10-CM | POA: Diagnosis not present

## 2017-05-31 DIAGNOSIS — M170 Bilateral primary osteoarthritis of knee: Secondary | ICD-10-CM | POA: Diagnosis not present

## 2017-06-07 DIAGNOSIS — M25562 Pain in left knee: Secondary | ICD-10-CM | POA: Diagnosis not present

## 2017-06-07 DIAGNOSIS — M1712 Unilateral primary osteoarthritis, left knee: Secondary | ICD-10-CM | POA: Diagnosis not present

## 2017-06-14 DIAGNOSIS — M1712 Unilateral primary osteoarthritis, left knee: Secondary | ICD-10-CM | POA: Diagnosis not present

## 2017-06-14 DIAGNOSIS — M25562 Pain in left knee: Secondary | ICD-10-CM | POA: Diagnosis not present

## 2017-06-21 DIAGNOSIS — M25562 Pain in left knee: Secondary | ICD-10-CM | POA: Diagnosis not present

## 2017-06-21 DIAGNOSIS — M1712 Unilateral primary osteoarthritis, left knee: Secondary | ICD-10-CM | POA: Diagnosis not present

## 2017-07-03 ENCOUNTER — Ambulatory Visit (INDEPENDENT_AMBULATORY_CARE_PROVIDER_SITE_OTHER): Payer: No Typology Code available for payment source | Admitting: Orthopaedic Surgery

## 2017-08-16 DIAGNOSIS — E782 Mixed hyperlipidemia: Secondary | ICD-10-CM | POA: Diagnosis not present

## 2017-08-16 DIAGNOSIS — Z125 Encounter for screening for malignant neoplasm of prostate: Secondary | ICD-10-CM | POA: Diagnosis not present

## 2017-08-16 DIAGNOSIS — Z0001 Encounter for general adult medical examination with abnormal findings: Secondary | ICD-10-CM | POA: Diagnosis not present

## 2017-08-16 DIAGNOSIS — E559 Vitamin D deficiency, unspecified: Secondary | ICD-10-CM | POA: Diagnosis not present

## 2017-08-16 DIAGNOSIS — E291 Testicular hypofunction: Secondary | ICD-10-CM | POA: Diagnosis not present

## 2017-08-16 DIAGNOSIS — M179 Osteoarthritis of knee, unspecified: Secondary | ICD-10-CM | POA: Diagnosis not present

## 2017-08-16 DIAGNOSIS — I10 Essential (primary) hypertension: Secondary | ICD-10-CM | POA: Diagnosis not present

## 2017-08-16 DIAGNOSIS — Z79899 Other long term (current) drug therapy: Secondary | ICD-10-CM | POA: Diagnosis not present

## 2017-08-16 DIAGNOSIS — N5201 Erectile dysfunction due to arterial insufficiency: Secondary | ICD-10-CM | POA: Diagnosis not present

## 2017-08-30 ENCOUNTER — Ambulatory Visit (INDEPENDENT_AMBULATORY_CARE_PROVIDER_SITE_OTHER): Payer: Medicare HMO | Admitting: Orthopaedic Surgery

## 2017-08-30 ENCOUNTER — Encounter (INDEPENDENT_AMBULATORY_CARE_PROVIDER_SITE_OTHER): Payer: Self-pay | Admitting: Orthopaedic Surgery

## 2017-08-30 DIAGNOSIS — M1712 Unilateral primary osteoarthritis, left knee: Secondary | ICD-10-CM | POA: Diagnosis not present

## 2017-08-30 NOTE — Progress Notes (Signed)
Patient is well-known to me.  We have been following him for some time with debilitating arthritis involving his left knee.  This is been hurting him for a while now.  Certainly for over a year.  We have tried steroid injections and hyaluronic acid injections in his left knee and he says that it does not help.  His pain is daily.  It can be 10 out of 10 at times.  His left knee pain is detrimentally affected his activities daily living, his quality of life, his mobility.  At this point having tried and failed all forms conservative treatment he does wish to proceed with a left total knee arthroplasty later this year.  We have shown him the knee models and explained in detail what the surgery involves.  His x-rays show severe disease in his left knee with significant particular osteophytes and joint space narrowing as well as sclerotic and cystic changes.  On exam he has medial lateral joint line tenderness and patellofemoral crepitation.  His knee is stable with good range of motion.  Is very painful to him as well.  There is a mild to moderate knee joint effusion.  At this point we had a long and thorough discussion about knee replacement surgery.  Again we have shown him his x-rays and a knee replacement model.  I had a long and thorough and detailed discussion with him about knee replacement surgery including talking to him about his intraoperative and postoperative course and a detailed discussion the risk minutes of the surgery.  We will work on getting this scheduled in the future.  All questions concerns were answered and addressed.

## 2017-10-03 DIAGNOSIS — G4733 Obstructive sleep apnea (adult) (pediatric): Secondary | ICD-10-CM | POA: Diagnosis not present

## 2017-10-13 DIAGNOSIS — I10 Essential (primary) hypertension: Secondary | ICD-10-CM | POA: Diagnosis not present

## 2017-10-13 DIAGNOSIS — H6983 Other specified disorders of Eustachian tube, bilateral: Secondary | ICD-10-CM | POA: Diagnosis not present

## 2017-10-26 DIAGNOSIS — Z8679 Personal history of other diseases of the circulatory system: Secondary | ICD-10-CM | POA: Diagnosis not present

## 2017-11-02 DIAGNOSIS — E782 Mixed hyperlipidemia: Secondary | ICD-10-CM | POA: Diagnosis not present

## 2017-11-09 ENCOUNTER — Ambulatory Visit: Payer: Medicare HMO | Admitting: Cardiovascular Disease

## 2017-11-09 ENCOUNTER — Encounter: Payer: Self-pay | Admitting: Cardiovascular Disease

## 2017-11-09 VITALS — BP 148/82 | HR 59 | Ht 74.0 in | Wt 260.0 lb

## 2017-11-09 DIAGNOSIS — I483 Typical atrial flutter: Secondary | ICD-10-CM | POA: Diagnosis not present

## 2017-11-09 DIAGNOSIS — G4733 Obstructive sleep apnea (adult) (pediatric): Secondary | ICD-10-CM

## 2017-11-09 DIAGNOSIS — I519 Heart disease, unspecified: Secondary | ICD-10-CM

## 2017-11-09 DIAGNOSIS — I10 Essential (primary) hypertension: Secondary | ICD-10-CM

## 2017-11-09 DIAGNOSIS — E669 Obesity, unspecified: Secondary | ICD-10-CM | POA: Diagnosis not present

## 2017-11-09 NOTE — Progress Notes (Signed)
Cardiology Office Note:    Date:  11/09/2017   ID:  Alessandro, Griep 1950/06/13, MRN [id number]  PCP:  [name], MD  Cardiologist:  No primary care provider on file.   Referring MD: [name], MD   Chief Complaint  Patient presents with  . Medical Clearance    pt want to know if he's ok to join the gym  Atrial flutter  History of Present Illness:    HONORIO DEVOL is a 67 y.o. male with a hx of atrial flutter requiring brief hospitalization in August 2018 asthma, obstructive sleep apnea, chronic kidney disease stage III, nephrolithiasis, essential hypertension.  He briefly took anticoagulants, but stopped them due to hematuria.  He has not had any symptomatic episodes of atrial flutter since August 2018.  Reportedly he had an episode of "atrial fibrillation" 25 years ago when he was living in [place].  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  He has not had clinical heart failure.  Denies exertional dyspnea or angina and has not had syncope or palpitations.  He denies any problems with daytime hypersomnolence.  Epstein score is only 5.  He is compliant with CPAP.  His blood pressure is mildly elevated today, although it was normal when he just saw [name] recently.  He feels great.  He wants to go back to the gym to exercise.  The patient specifically denies any chest pain at rest exertion, dyspnea at rest or with exertion, orthopnea, paroxysmal nocturnal dyspnea, syncope, palpitations, focal neurological deficits, intermittent claudication, lower extremity edema, unexplained weight gain, cough, hemoptysis or wheezing.   Past Medical History:  Diagnosis Date  . Bronchitis    hx of   . CHF (congestive heart failure) (HCC)    EF 45%  . 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 GERD (gastroesophageal reflux disease)   . Hypertensive cardiovascular disease   . Kidney stones   . Pneumonia    hx of    . Rapid atrial fibrillation (HCC) 11/26/2014  . Sleep apnea    USES CPAP  . Typical atrial flutter (HCC) 10/2016    Past Surgical History:  Procedure Laterality Date  . colonscopy     . CYSTOSCOPY WITH RETROGRADE PYELOGRAM, URETEROSCOPY AND STENT PLACEMENT Right 08/01/2014   Procedure: CYSTOSCOPY WITH RIGHT  RETROGRADE PYELOGRAM/RIGHT URETEROSCOPY HOLMIUMN LASER LITOTRIPSY  STENT PLACEMENT ;  Surgeon: Jerilee Field, MD;  Location: [facility];  Service: Urology;  Laterality: Right;  . URETEROSCOPY WITH HOLMIUM LASER LITHOTRIPSY Right 11/18/2016   Procedure: URETEROSCOPY WITH HOLMIUM LASER LITHOTRIPSY/STENT PLACEMENT;  Surgeon: Jerilee Field, MD;  Location: [facility];  Service: Urology;  Laterality: Right;    Current Medications: Current Meds  Medication Sig  . acetaminophen (TYLENOL) 650 MG CR tablet Take 650 mg by mouth 2 (two) times daily as needed for pain.  Marland Kitchen albuterol (PROVENTIL HFA;VENTOLIN HFA) 108 (90 BASE) MCG/ACT inhaler Inhale 1-2 puffs into the lungs every 6 (six) hours as needed for wheezing.  Alphonsus Sias Pollen 580 MG CAPS Take 1,160 mg by mouth daily.   . budesonide-formoterol (SYMBICORT) 160-4.5 MCG/ACT inhaler Inhale 2 puffs into the lungs every other day.   . losartan (COZAAR) 100 MG tablet Take 100 mg by mouth daily.  . meloxicam (MOBIC) 15 MG tablet Take 15 mg by mouth daily as needed for pain.      Allergies:   Allopurinol; Aspirin; Prednisone; Rosuvastatin; Simvastatin; and Tuberculin tests   Social History  Socioeconomic History  . Marital status: Married    Spouse name: Not on file  . Number of children: Not on file  . Years of education: Not on file  . Highest education level: Not on file  Occupational History  . Not on file  Social Needs  . Financial resource strain: Not on file  . Food insecurity:    Worry: Not on file    Inability: Not on file  . Transportation needs:    Medical: Not on file    Non-medical: Not on file  Tobacco Use  . Smoking  status: Never Smoker  . Smokeless tobacco: Never Used  Substance and Sexual Activity  . Alcohol use: No  . Drug use: No  . Sexual activity: Not on file  Lifestyle  . Physical activity:    Days per week: Not on file    Minutes per session: Not on file  . Stress: Not on file  Relationships  . Social connections:    Talks on phone: Not on file    Gets together: Not on file    Attends religious service: Not on file    Active member of club or organization: Not on file    Attends meetings of clubs or organizations: Not on file    Relationship status: Not on file  Other Topics Concern  . 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  ROS:   Please see the history of present illness.     All other systems reviewed and are negative.  EKGs/Labs/Other Studies Reviewed:    The following studies were reviewed today: EP consultation [name], Sept 2018  EKG:  EKG is ordered today.  The ekg ordered today demonstrates sinus rhythm with isolated PACs, normal repolarization pattern, QTC 470 ms  Recent Labs: 11/12/2016: Hemoglobin 13.5; Platelets 235 11/18/2016: BUN 25; Creatinine, Ser 2.81; Potassium 4.3; Sodium 138  Recent Lipid Panel No results found for: CHOL, TRIG, HDL, CHOLHDL, VLDL, LDLCALC, LDLDIRECT  Physical Exam:    VS:  BP (!) 148/82   Pulse (!) 59   Ht 6\' 2"  (1.88 m)   Wt 260 lb (117.9 kg)   BMI 33.38 kg/m     Wt Readings from Last 3 Encounters:  11/09/17 260 lb (117.9 kg)  11/30/16 263 lb (119.3 kg)  11/18/16 263 lb 8 oz (119.5 kg)     GEN: He is very muscular, but also mildly obese; well nourished, well developed in no acute distress HEENT: Normal NECK: No JVD; No carotid bruits LYMPHATICS: No  lymphadenopathy CARDIAC: RRR with occasional ectopy, no murmurs, rubs, gallops RESPIRATORY:  Clear to auscultation without rales, wheezing or rhonchi  ABDOMEN: Soft, non-tender, non-distended MUSCULOSKELETAL:  No edema; No deformity  SKIN: Warm and dry NEUROLOGIC:  Alert and oriented x 3 PSYCHIATRIC:  Normal affect   ASSESSMENT:    1. Typical atrial flutter (HCC)   2. LV dysfunction   3. OSA (obstructive sleep apnea)   4. Mild obesity   5. Essential hypertension    PLAN:    In order of problems listed above:  1. AFlutter: I have not seen any documentation of atrial fibrillation, just typical atrial flutter.  The arrhythmia is extremely infrequent.  It might  be related to obesity/obstructive sleep apnea.  He does not want to take anticoagulants, especially since he had hematuria.  CHADSVasc 3 (age, HTN, decreased left ventricular systolic function). I have recommended that he undergo ablation if he has recurrent atrial flutter.  Recheck left ventricular systolic function with an echocardiogram. 2. LV systolic dysfunction: Without clinical heart failure.  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  Reevaluate by echo. 3. OSA: Discussed the relationship with sleep apnea and atrial flutter.  Recommended 100% compliance with CPAP.  Also recommended weight loss.   4. Mild obesity: Will try to get down to a weight of 230 pounds by this time next year, which would correspond to a BMI just under 30 5. HTN mildly elevated blood pressure today, but he reports blood pressure was normal when he saw [name] last week.  No changes made to his medications.  I encouraged him to go to the gym and to perform aerobic activity and light weight lifting.  He should be focusing on relatively light weights (able to perform 10-20 reps) rather than intense sudden strain with bench pressing/etc.   Medication Adjustments/Labs and Tests Ordered: Current medicines are reviewed at length with the patient today.  Concerns  regarding medicines are outlined above.  Orders Placed This Encounter  Procedures  . EKG 12-Lead  . ECHOCARDIOGRAM COMPLETE   No orders of the defined types were placed in this encounter.   Patient Instructions  Medication Instructions: Dr Royann Shivers recommends that you continue on your current medications as directed. Please refer to the Current Medication list given to you today.  Labwork: NONE ORDERED  Testing/Procedures: 1. Echocardiogram - Your physician has requested that you have an echocardiogram. Echocardiography is a painless test that uses sound waves to create images of your heart. It provides your doctor with information about the size and shape of your heart and how well your heart's chambers and valves are working. This procedure takes approximately one hour. There are no restrictions for this procedure.  >> This will be performed at our [facility] location [address] [place], [address] [place] [address] [phone]  Follow-up: Dr Royann Shivers recommends that you schedule a follow-up appointment in 12 months. You will receive a reminder letter in the mail two months in advance. If you don't receive a letter, please call our office to schedule the follow-up appointment.  If you need a refill on your cardiac medications before your next appointment, please call your pharmacy.    Signed, Thurmon Fair, MD  11/09/2017 2:41 PM    [facility]

## 2017-11-09 NOTE — Patient Instructions (Addendum)
Medication Instructions: Dr Croitoru recommends that you continue on your current medications as directed. Please refer to the Current Medication list given to you today.  Labwork: NONE ORDERED  Testing/Procedures: 1. Echocardiogram - Your physician has requested that you have an echocardiogram. Echocardiography is a painless test that uses sound waves to create images of your heart. It provides your doctor with information about the size and shape of your heart and how well your heart's chambers and valves are working. This procedure takes approximately one hour. There are no restrictions for this procedure.  >> This will be performed at our [address] location [address], [address] [place] [address] [phone]  Follow-up: Dr Croitoru recommends that you schedule a follow-up appointment in 12 months. You will receive a reminder letter in the mail two months in advance. If you don't receive a letter, please call our office to schedule the follow-up appointment.  If you need a refill on your cardiac medications before your next appointment, please call your pharmacy. 

## 2017-11-16 DIAGNOSIS — Z8679 Personal history of other diseases of the circulatory system: Secondary | ICD-10-CM | POA: Diagnosis not present

## 2017-11-16 DIAGNOSIS — E782 Mixed hyperlipidemia: Secondary | ICD-10-CM | POA: Diagnosis not present

## 2017-11-16 DIAGNOSIS — Z23 Encounter for immunization: Secondary | ICD-10-CM | POA: Diagnosis not present

## 2017-11-23 DIAGNOSIS — M1712 Unilateral primary osteoarthritis, left knee: Secondary | ICD-10-CM | POA: Diagnosis not present

## 2017-11-28 ENCOUNTER — Ambulatory Visit (HOSPITAL_COMMUNITY): Payer: Medicare HMO | Attending: Cardiovascular Disease

## 2017-12-07 DIAGNOSIS — J323 Chronic sphenoidal sinusitis: Secondary | ICD-10-CM | POA: Diagnosis not present

## 2017-12-10 DIAGNOSIS — R51 Headache: Secondary | ICD-10-CM | POA: Diagnosis not present

## 2017-12-12 ENCOUNTER — Ambulatory Visit
Admission: RE | Admit: 2017-12-12 | Discharge: 2017-12-12 | Disposition: A | Discharge status: Home / Self Care | Payer: Medicare HMO | Source: Ambulatory Visit | Attending: Internal Medicine | Admitting: Internal Medicine

## 2017-12-12 ENCOUNTER — Other Ambulatory Visit: Payer: Self-pay | Admitting: Internal Medicine

## 2017-12-12 DIAGNOSIS — R519 Headache, unspecified: Secondary | ICD-10-CM

## 2017-12-12 DIAGNOSIS — R4182 Altered mental status, unspecified: Secondary | ICD-10-CM | POA: Diagnosis not present

## 2017-12-12 DIAGNOSIS — Z86010 Personal history of colonic polyps: Secondary | ICD-10-CM | POA: Diagnosis not present

## 2017-12-12 DIAGNOSIS — N183 Chronic kidney disease, stage 3 (moderate): Secondary | ICD-10-CM | POA: Diagnosis not present

## 2017-12-12 DIAGNOSIS — I10 Essential (primary) hypertension: Secondary | ICD-10-CM | POA: Diagnosis not present

## 2017-12-12 DIAGNOSIS — R51 Headache: Principal | ICD-10-CM

## 2017-12-12 DIAGNOSIS — I69314 Frontal lobe and executive function deficit following cerebral infarction: Secondary | ICD-10-CM | POA: Diagnosis not present

## 2017-12-12 DIAGNOSIS — R5383 Other fatigue: Secondary | ICD-10-CM | POA: Diagnosis not present

## 2017-12-12 DIAGNOSIS — R634 Abnormal weight loss: Secondary | ICD-10-CM | POA: Diagnosis not present

## 2017-12-13 DIAGNOSIS — N200 Calculus of kidney: Secondary | ICD-10-CM | POA: Diagnosis not present

## 2017-12-13 DIAGNOSIS — N281 Cyst of kidney, acquired: Secondary | ICD-10-CM | POA: Diagnosis not present

## 2017-12-13 DIAGNOSIS — N329 Bladder disorder, unspecified: Secondary | ICD-10-CM | POA: Diagnosis not present

## 2017-12-14 ENCOUNTER — Other Ambulatory Visit (HOSPITAL_COMMUNITY): Payer: Self-pay | Admitting: Internal Medicine

## 2017-12-14 ENCOUNTER — Encounter: Payer: Self-pay | Admitting: Neurology

## 2017-12-14 DIAGNOSIS — I639 Cerebral infarction, unspecified: Secondary | ICD-10-CM | POA: Diagnosis not present

## 2017-12-15 ENCOUNTER — Other Ambulatory Visit: Payer: Self-pay

## 2017-12-15 ENCOUNTER — Ambulatory Visit (HOSPITAL_BASED_OUTPATIENT_CLINIC_OR_DEPARTMENT_OTHER): Payer: Medicare HMO

## 2017-12-15 DIAGNOSIS — R0789 Other chest pain: Secondary | ICD-10-CM | POA: Diagnosis not present

## 2017-12-15 DIAGNOSIS — I6340 Cerebral infarction due to embolism of unspecified cerebral artery: Secondary | ICD-10-CM | POA: Diagnosis not present

## 2017-12-15 DIAGNOSIS — I639 Cerebral infarction, unspecified: Secondary | ICD-10-CM

## 2017-12-15 DIAGNOSIS — I10 Essential (primary) hypertension: Secondary | ICD-10-CM | POA: Diagnosis not present

## 2017-12-15 DIAGNOSIS — R0602 Shortness of breath: Secondary | ICD-10-CM | POA: Diagnosis not present

## 2017-12-16 ENCOUNTER — Emergency Department (HOSPITAL_COMMUNITY): Payer: Medicare HMO

## 2017-12-16 ENCOUNTER — Other Ambulatory Visit: Payer: Self-pay

## 2017-12-16 ENCOUNTER — Encounter (HOSPITAL_COMMUNITY): Payer: Self-pay | Admitting: Emergency Medicine

## 2017-12-16 ENCOUNTER — Emergency Department (HOSPITAL_COMMUNITY)
Admission: EM | Admit: 2017-12-16 | Discharge: 2017-12-16 | Disposition: A | Discharge status: Home / Self Care | Payer: Medicare HMO | Source: Home / Self Care | Attending: Emergency Medicine | Admitting: Emergency Medicine

## 2017-12-16 DIAGNOSIS — R079 Chest pain, unspecified: Secondary | ICD-10-CM

## 2017-12-16 DIAGNOSIS — J45909 Unspecified asthma, uncomplicated: Secondary | ICD-10-CM | POA: Insufficient documentation

## 2017-12-16 DIAGNOSIS — R0602 Shortness of breath: Secondary | ICD-10-CM | POA: Diagnosis not present

## 2017-12-16 DIAGNOSIS — Z79899 Other long term (current) drug therapy: Secondary | ICD-10-CM

## 2017-12-16 DIAGNOSIS — I110 Hypertensive heart disease with heart failure: Secondary | ICD-10-CM | POA: Insufficient documentation

## 2017-12-16 DIAGNOSIS — I509 Heart failure, unspecified: Secondary | ICD-10-CM

## 2017-12-16 DIAGNOSIS — I10 Essential (primary) hypertension: Secondary | ICD-10-CM | POA: Diagnosis not present

## 2017-12-16 DIAGNOSIS — R0789 Other chest pain: Secondary | ICD-10-CM | POA: Diagnosis not present

## 2017-12-16 LAB — CBC
HCT: 39.5 % (ref 39.0–52.0)
Hemoglobin: 12.5 g/dL — ABNORMAL LOW (ref 13.0–17.0)
MCH: 28.7 pg (ref 26.0–34.0)
MCHC: 31.6 g/dL (ref 30.0–36.0)
MCV: 90.6 fL (ref 80.0–100.0)
PLATELETS: 260 10*3/uL (ref 150–400)
RBC: 4.36 MIL/uL (ref 4.22–5.81)
RDW: 12.4 % (ref 11.5–15.5)
WBC: 2.8 10*3/uL — ABNORMAL LOW (ref 4.0–10.5)
nRBC: 0 % (ref 0.0–0.2)

## 2017-12-16 LAB — BASIC METABOLIC PANEL
ANION GAP: 9 (ref 5–15)
BUN: 27 mg/dL — ABNORMAL HIGH (ref 8–23)
CALCIUM: 9.1 mg/dL (ref 8.9–10.3)
CO2: 23 mmol/L (ref 22–32)
Chloride: 100 mmol/L (ref 98–111)
Creatinine, Ser: 2.06 mg/dL — ABNORMAL HIGH (ref 0.61–1.24)
GFR calc non Af Amer: 31 mL/min — ABNORMAL LOW (ref >60)
GFR, EST AFRICAN AMERICAN: 36 mL/min — AB (ref >60)
GLUCOSE: 116 mg/dL — AB (ref 70–99)
Potassium: 4 mmol/L (ref 3.5–5.1)
Sodium: 132 mmol/L — ABNORMAL LOW (ref 135–145)

## 2017-12-16 LAB — I-STAT TROPONIN, ED: Troponin i, poc: 0 ng/mL (ref 0.00–0.08)

## 2017-12-16 NOTE — ED Notes (Signed)
ED Provider at bedside. 

## 2017-12-16 NOTE — ED Provider Notes (Signed)
[facility] EMERGENCY DEPARTMENT Provider Note   CSN: [id number] Arrival date & time: 12/16/17  0756     History   Chief Complaint Chief Complaint  Patient presents with  . Chest Pain    HPI Darrell Perkins is a 67 y.o. male.  HPI   He presents for evaluation of chest discomfort.  He scribes the pain as pulling sensation in the left breast region.  Pain is currently resolved.  He first noticed it yesterday.  He has been seeing his doctor recently for headache, and states he was diagnosed with a stroke, 2 weeks ago.  This was apparently after he had CT imaging for suspected sinusitis.  There have been no specific intervention since that time.  He has a prescription for Augmentin which he is apparently taking.  He did not take his regular morning medicines today.  He denies blurred vision, weakness, paresthesia or dizziness at this time.  He has had some periods of lightheadedness, but not currently.  There is been no shortness of breath, nausea or vomiting.  There are no other known modifying factors.  Past Medical History:  Diagnosis Date  . Bronchitis    hx of   . CHF (congestive heart failure) (HCC)    EF 45%  . 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 GERD (gastroesophageal reflux disease)   . Hypertensive cardiovascular disease   . Kidney stones   . Pneumonia    hx of   . Rapid atrial fibrillation (HCC) 11/26/2014  . Sleep apnea    USES CPAP  . Typical atrial flutter (HCC) 10/2016    Patient Active Problem List   Diagnosis Date Noted  . Chronic pain of left knee 03/06/2017  . Unilateral primary osteoarthritis, left knee 03/06/2017  . Leukopenia 11/03/2016  . Atrial fibrillation with RVR (HCC) 11/02/2016  . Allergic rhinitis 11/26/2014  . Cutaneous eruption 11/26/2014  . Uncomplicated asthma 11/26/2014  . Asthma, extrinsic, without status asthmaticus 11/26/2014  . Nephrolithiasis 11/26/2014  . Cellulitis and abscess of leg 11/26/2014  . ED  (erectile dysfunction) of organic origin 11/26/2014  . Benign essential HTN 11/26/2014  . Peripheral neuralgia 11/26/2014  . Cephalalgia 11/26/2014  . Tinea cruris 11/26/2014  . Folliculitis 11/26/2014  . Hypercholesterolemia without hypertriglyceridemia 11/26/2014  . Hematuria, microscopic 11/26/2014  . H/O adenomatous polyp of colon 11/26/2014  . Snoring 11/26/2014  . Difficulty staying awake 11/26/2014  . Essential hypertension 11/26/2014  . Combined fat and carbohydrate induced hyperlipemia 11/26/2014  . Tinea pedis 11/26/2014  . Erectile dysfunction due to arterial insufficiency 11/26/2014  . Perifolliculitis capitis abscedens 11/26/2014  . Other constipation 11/26/2014  . Chest pain 11/26/2014  . Other fatigue 11/26/2014  . Pain of right leg 11/26/2014  . Lesion of lateral popliteal nerve 11/26/2014  . Chronic kidney disease, stage III (moderate) (HCC) 11/26/2014  . 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 Athlete's foot 11/26/2014  . Mixed hyperlipidemia 11/26/2014  . Adiposity 11/26/2014  . Polypharmacy 11/26/2014  . 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 Chest pain syndrome 11/26/2014  . Rapid atrial fibrillation (HCC) 11/26/2014  . OSA on CPAP 11/26/2014  . Encounter for general adult medical examination without abnormal findings 09/02/2014    Past Surgical History:  Procedure Laterality Date  . colonscopy     . CYSTOSCOPY WITH RETROGRADE PYELOGRAM, URETEROSCOPY AND STENT PLACEMENT Right 08/01/2014   Procedure: CYSTOSCOPY WITH RIGHT  RETROGRADE PYELOGRAM/RIGHT URETEROSCOPY HOLMIUMN LASER LITOTRIPSY  STENT PLACEMENT ;  Surgeon: Molli Hazard  Mena Goes, MD;  Location: [facility];  Service: Urology;  Laterality: Right;  . URETEROSCOPY WITH HOLMIUM LASER LITHOTRIPSY Right 11/18/2016   Procedure: URETEROSCOPY WITH HOLMIUM LASER LITHOTRIPSY/STENT PLACEMENT;  Surgeon: Jerilee Field, MD;  Location: [facility];  Service: Urology;  Laterality: Right;        Home Medications      Prior to Admission medications   Medication Sig Start Date End Date Taking? Authorizing Provider  acetaminophen (TYLENOL) 650 MG CR tablet Take 650 mg by mouth 2 (two) times daily as needed for pain.    [provider]  albuterol (PROVENTIL HFA;VENTOLIN HFA) 108 (90 BASE) MCG/ACT inhaler Inhale 1-2 puffs into the lungs every 6 (six) hours as needed for wheezing. 04/14/12   Sunnie Nielsen, MD  Bee Pollen 580 MG CAPS Take 1,160 mg by mouth daily.     [provider]  budesonide-formoterol (SYMBICORT) 160-4.5 MCG/ACT inhaler Inhale 2 puffs into the lungs every other day.     [provider]  losartan (COZAAR) 100 MG tablet Take 100 mg by mouth daily. 09/18/16   [provider]  meloxicam (MOBIC) 15 MG tablet Take 15 mg by mouth daily as needed for pain.  09/01/16   [provider]    Family History Family History  Problem Relation Age of Onset  . Hypertension Mother   . Diabetes Father   . Cerebrovascular Disease Father   . Alcohol abuse Father   . Hypertension Father   . COPD Father     Social History Social History   Tobacco Use  . Smoking status: Never Smoker  . Smokeless tobacco: Never Used  Substance Use Topics  . Alcohol use: No  . Drug use: No     Allergies   Allopurinol; Aspirin; Prednisone; Rosuvastatin; Simvastatin; and Tuberculin tests   Review of Systems Review of Systems  All other systems reviewed and are negative.    Physical Exam Updated Vital Signs BP (!) 155/98 (BP Location: Right Arm)   Pulse 60   Temp 98.3 F (36.8 C) (Oral)   Resp 16   Ht 6' 2.5" (1.892 m)   Wt 117.9 kg   SpO2 99%   BMI 32.94 kg/m   Physical Exam  Constitutional: He is oriented to person, place, and time. He appears well-developed and well-nourished. No distress.  HENT:  Head: Normocephalic and atraumatic.  Right Ear: External ear normal.  Left Ear: External ear normal.  Eyes: Pupils are equal, round, and reactive to light.  Conjunctivae and EOM are normal.  Neck: Normal range of motion and phonation normal. Neck supple.  Cardiovascular: Normal rate, regular rhythm and normal heart sounds.  Pulmonary/Chest: Effort normal and breath sounds normal. He exhibits no bony tenderness.  Abdominal: Soft. There is no tenderness.  Musculoskeletal: Normal range of motion.  Normal strength arms and legs bilaterally.  Neurological: He is alert and oriented to person, place, and time. No cranial nerve deficit or sensory deficit. He exhibits normal muscle tone. Coordination normal.  No dysarthria, aphasia or nystagmus.  No ataxia.  Skin: Skin is warm, dry and intact.  Psychiatric: He has a normal mood and affect. His behavior is normal. Judgment and thought content normal.  Nursing note and vitals reviewed.    ED Treatments / Results  Labs (all labs ordered are listed, but only abnormal results are displayed) Labs Reviewed  BASIC METABOLIC PANEL  CBC  I-STAT TROPONIN, ED    EKG None  Radiology No results found.  Procedures Procedures (including  critical care time)  Medications Ordered in ED Medications - No data to display   Initial Impression / Assessment and Plan / ED Course  I have reviewed the triage vital signs and the nursing notes.  Pertinent labs & imaging results that were available during my care of the patient were reviewed by me and considered in my medical decision making (see chart for details).      Patient Vitals for the past 24 hrs:  BP Temp Temp src Pulse Resp SpO2 Height Weight  12/16/17 0854 - - - (!) 54 12 100 % - -  12/16/17 0810 (!) 155/98 98.3 F (36.8 C) Oral 60 16 99 % - -  12/16/17 0805 - - - - - - 6' 2.5" (1.892 m) 117.9 kg    9:08 AM Reevaluation with update and discussion. After initial assessment and treatment, an updated evaluation reveals he is calm and comfortable.  Findings discussed and questions answered. Mancel Bale   Medical Decision Making: Nonspecific  chest pain with reassuring evaluation.  Patient saw cardiology last week and was referred for echocardiogram which was done yesterday.  These results showed mild diastolic dysfunction but otherwise normal.  Patient has had recent complaints of evaluation, and is not need an additional evaluation for cardiac disorders.  Doubt ACS, PE or pneumonia.  No evidence for acute stroke.  CRITICAL CARE-no Performed by: Mancel Bale   Nursing Notes Reviewed/ Care Coordinated Applicable Imaging Reviewed Interpretation of Laboratory Data incorporated into ED treatment  The patient appears reasonably screened and/or stabilized for discharge and I doubt any other medical condition or other [facility] requiring further screening, evaluation, or treatment in the ED at this time prior to discharge.  Plan: Home Medications-continue home meds; Home Treatments-grades advance diet and activity; return here if the recommended treatment, does not improve the symptoms; Recommended follow up-PCP follow-up for further evaluation and treatment.]     Final Clinical Impressions(s) / ED Diagnoses   Final diagnoses:  None    ED Discharge Orders    None       Mancel Bale, MD 12/16/17 1003

## 2017-12-16 NOTE — Discharge Instructions (Addendum)
The testing today did not show any cardiac problems.  It is important to follow-up with your primary care doctor for further evaluation and treatment of your symptoms.

## 2017-12-16 NOTE — ED Notes (Signed)
Patient verbalized understanding of dc instructions, vss, resp e/u, nad. Ambulatory upon discharge.

## 2017-12-16 NOTE — ED Triage Notes (Signed)
Pt. Stated,, Ive had some discomfort started this morning. Felt sort of faint.

## 2017-12-17 ENCOUNTER — Inpatient Hospital Stay (HOSPITAL_COMMUNITY): Payer: Medicare HMO

## 2017-12-17 ENCOUNTER — Other Ambulatory Visit: Payer: Self-pay

## 2017-12-17 ENCOUNTER — Emergency Department (HOSPITAL_COMMUNITY): Payer: Medicare HMO

## 2017-12-17 ENCOUNTER — Encounter (HOSPITAL_COMMUNITY): Payer: Self-pay | Admitting: Emergency Medicine

## 2017-12-17 ENCOUNTER — Inpatient Hospital Stay (HOSPITAL_COMMUNITY)
Admission: EM | Admit: 2017-12-17 | Discharge: 2017-12-18 | DRG: 065 | Disposition: A | Discharge status: Home / Self Care | Payer: Medicare HMO | Attending: Internal Medicine | Admitting: Internal Medicine

## 2017-12-17 DIAGNOSIS — Z9989 Dependence on other enabling machines and devices: Secondary | ICD-10-CM

## 2017-12-17 DIAGNOSIS — J45909 Unspecified asthma, uncomplicated: Secondary | ICD-10-CM | POA: Diagnosis present

## 2017-12-17 DIAGNOSIS — N183 Chronic kidney disease, stage 3 (moderate): Secondary | ICD-10-CM | POA: Diagnosis present

## 2017-12-17 DIAGNOSIS — M1710 Unilateral primary osteoarthritis, unspecified knee: Secondary | ICD-10-CM | POA: Diagnosis present

## 2017-12-17 DIAGNOSIS — G8929 Other chronic pain: Secondary | ICD-10-CM | POA: Diagnosis present

## 2017-12-17 DIAGNOSIS — I639 Cerebral infarction, unspecified: Secondary | ICD-10-CM

## 2017-12-17 DIAGNOSIS — E785 Hyperlipidemia, unspecified: Secondary | ICD-10-CM | POA: Diagnosis present

## 2017-12-17 DIAGNOSIS — Z7951 Long term (current) use of inhaled steroids: Secondary | ICD-10-CM

## 2017-12-17 DIAGNOSIS — Z7902 Long term (current) use of antithrombotics/antiplatelets: Secondary | ICD-10-CM

## 2017-12-17 DIAGNOSIS — I5030 Unspecified diastolic (congestive) heart failure: Secondary | ICD-10-CM | POA: Diagnosis not present

## 2017-12-17 DIAGNOSIS — I10 Essential (primary) hypertension: Secondary | ICD-10-CM

## 2017-12-17 DIAGNOSIS — I483 Typical atrial flutter: Secondary | ICD-10-CM | POA: Diagnosis not present

## 2017-12-17 DIAGNOSIS — R413 Other amnesia: Secondary | ICD-10-CM | POA: Diagnosis not present

## 2017-12-17 DIAGNOSIS — Z888 Allergy status to other drugs, medicaments and biological substances status: Secondary | ICD-10-CM | POA: Diagnosis not present

## 2017-12-17 DIAGNOSIS — I4892 Unspecified atrial flutter: Secondary | ICD-10-CM

## 2017-12-17 DIAGNOSIS — G4733 Obstructive sleep apnea (adult) (pediatric): Secondary | ICD-10-CM | POA: Diagnosis not present

## 2017-12-17 DIAGNOSIS — Z886 Allergy status to analgesic agent status: Secondary | ICD-10-CM | POA: Diagnosis not present

## 2017-12-17 DIAGNOSIS — Z79899 Other long term (current) drug therapy: Secondary | ICD-10-CM | POA: Diagnosis not present

## 2017-12-17 DIAGNOSIS — I480 Paroxysmal atrial fibrillation: Secondary | ICD-10-CM | POA: Diagnosis present

## 2017-12-17 DIAGNOSIS — R29700 NIHSS score 0: Secondary | ICD-10-CM | POA: Diagnosis not present

## 2017-12-17 DIAGNOSIS — I130 Hypertensive heart and chronic kidney disease with heart failure and stage 1 through stage 4 chronic kidney disease, or unspecified chronic kidney disease: Secondary | ICD-10-CM | POA: Diagnosis present

## 2017-12-17 DIAGNOSIS — I6340 Cerebral infarction due to embolism of unspecified cerebral artery: Principal | ICD-10-CM | POA: Diagnosis present

## 2017-12-17 LAB — LIPID PANEL
Cholesterol: 211 mg/dL — ABNORMAL HIGH (ref 0–200)
HDL: 35 mg/dL — ABNORMAL LOW (ref >40)
LDL Cholesterol: 149 mg/dL — ABNORMAL HIGH (ref 0–99)
Total CHOL/HDL Ratio: 6 RATIO
Triglycerides: 133 mg/dL (ref <150)
VLDL: 27 mg/dL (ref 0–40)

## 2017-12-17 LAB — I-STAT CHEM 8, ED
BUN: 29 mg/dL — ABNORMAL HIGH (ref 8–23)
Calcium, Ion: 1.16 mmol/L (ref 1.15–1.40)
Chloride: 100 mmol/L (ref 98–111)
Creatinine, Ser: 2.3 mg/dL — ABNORMAL HIGH (ref 0.61–1.24)
Glucose, Bld: 114 mg/dL — ABNORMAL HIGH (ref 70–99)
HCT: 38 % — ABNORMAL LOW (ref 39.0–52.0)
Hemoglobin: 12.9 g/dL — ABNORMAL LOW (ref 13.0–17.0)
Potassium: 4 mmol/L (ref 3.5–5.1)
Sodium: 135 mmol/L (ref 135–145)
TCO2: 26 mmol/L (ref 22–32)

## 2017-12-17 LAB — COMPREHENSIVE METABOLIC PANEL
ALK PHOS: 49 U/L (ref 38–126)
ALT: 17 U/L (ref 0–44)
AST: 19 U/L (ref 15–41)
Albumin: 3.8 g/dL (ref 3.5–5.0)
Anion gap: 11 (ref 5–15)
BUN: 24 mg/dL — ABNORMAL HIGH (ref 8–23)
CALCIUM: 9.3 mg/dL (ref 8.9–10.3)
CHLORIDE: 99 mmol/L (ref 98–111)
CO2: 24 mmol/L (ref 22–32)
CREATININE: 2.09 mg/dL — AB (ref 0.61–1.24)
GFR calc Af Amer: 36 mL/min — ABNORMAL LOW (ref >60)
GFR, EST NON AFRICAN AMERICAN: 31 mL/min — AB (ref >60)
Glucose, Bld: 117 mg/dL — ABNORMAL HIGH (ref 70–99)
Potassium: 3.9 mmol/L (ref 3.5–5.1)
Sodium: 134 mmol/L — ABNORMAL LOW (ref 135–145)
Total Bilirubin: 0.7 mg/dL (ref 0.3–1.2)
Total Protein: 7.6 g/dL (ref 6.5–8.1)

## 2017-12-17 LAB — DIFFERENTIAL
Abs Immature Granulocytes: 0.01 K/uL (ref 0.00–0.07)
Basophils Absolute: 0 K/uL (ref 0.0–0.1)
Basophils Relative: 1 %
Eosinophils Absolute: 0 K/uL (ref 0.0–0.5)
Eosinophils Relative: 1 %
Immature Granulocytes: 0 %
Lymphocytes Relative: 35 %
Lymphs Abs: 1.2 K/uL (ref 0.7–4.0)
Monocytes Absolute: 0.4 K/uL (ref 0.1–1.0)
Monocytes Relative: 11 %
Neutro Abs: 1.8 K/uL (ref 1.7–7.7)
Neutrophils Relative %: 52 %

## 2017-12-17 LAB — CBG MONITORING, ED: Glucose-Capillary: 98 mg/dL (ref 70–99)

## 2017-12-17 LAB — CBC
HCT: 39.3 % (ref 39.0–52.0)
Hemoglobin: 12.4 g/dL — ABNORMAL LOW (ref 13.0–17.0)
MCH: 28 pg (ref 26.0–34.0)
MCHC: 31.6 g/dL (ref 30.0–36.0)
MCV: 88.7 fL (ref 80.0–100.0)
Platelets: 294 K/uL (ref 150–400)
RBC: 4.43 MIL/uL (ref 4.22–5.81)
RDW: 12.5 % (ref 11.5–15.5)
WBC: 3.4 K/uL — ABNORMAL LOW (ref 4.0–10.5)
nRBC: 0 % (ref 0.0–0.2)

## 2017-12-17 LAB — HEMOGLOBIN A1C
Hgb A1c MFr Bld: 6.7 % — ABNORMAL HIGH (ref 4.8–5.6)
Mean Plasma Glucose: 145.59 mg/dL

## 2017-12-17 LAB — I-STAT TROPONIN, ED: Troponin i, poc: 0.01 ng/mL (ref 0.00–0.08)

## 2017-12-17 LAB — PROTIME-INR
INR: 1.02
Prothrombin Time: 13.3 s (ref 11.4–15.2)

## 2017-12-17 LAB — APTT: aPTT: 32 s (ref 24–36)

## 2017-12-17 MED ORDER — STROKE: EARLY STAGES OF RECOVERY BOOK
Freq: Once | Status: AC
  Administered 2017-12-17: 12:00:00

## 2017-12-17 MED ORDER — POLYETHYLENE GLYCOL 3350 17 G PO PACK: 17.0000 g | PACK | Freq: Every day | ORAL | Status: DC | PRN

## 2017-12-17 MED ORDER — MOMETASONE FURO-FORMOTEROL FUM 200-5 MCG/ACT IN AERO
2.0000 | INHALATION_SPRAY | Freq: Two times a day (BID) | RESPIRATORY_TRACT | Status: DC
  Administered 2017-12-17 – 2017-12-18 (×2): 2 via RESPIRATORY_TRACT
  Filled 2017-12-17: qty 8.8

## 2017-12-17 MED ORDER — VITAMIN D3 25 MCG (1000 UNIT) PO TABS
2000.0000 [IU] | ORAL_TABLET | Freq: Every day | ORAL | Status: DC
  Administered 2017-12-17 – 2017-12-18 (×2): 2000 [IU] via ORAL
  Filled 2017-12-17 (×4): qty 2

## 2017-12-17 MED ORDER — ALBUTEROL SULFATE (2.5 MG/3ML) 0.083% IN NEBU: 3.0000 mL | INHALATION_SOLUTION | Freq: Four times a day (QID) | RESPIRATORY_TRACT | Status: DC | PRN

## 2017-12-17 MED ORDER — EZETIMIBE 10 MG PO TABS
10.0000 mg | ORAL_TABLET | Freq: Every day | ORAL | Status: DC
  Administered 2017-12-17 – 2017-12-18 (×2): 10 mg via ORAL
  Filled 2017-12-17 (×2): qty 1

## 2017-12-17 MED ORDER — FLUTICASONE PROPIONATE 50 MCG/ACT NA SUSP
1.0000 | Freq: Every day | NASAL | Status: DC
  Filled 2017-12-17: qty 16

## 2017-12-17 MED ORDER — CLOPIDOGREL BISULFATE 75 MG PO TABS
75.0000 mg | ORAL_TABLET | Freq: Every day | ORAL | Status: DC
  Administered 2017-12-17 – 2017-12-18 (×2): 75 mg via ORAL
  Filled 2017-12-17 (×2): qty 1

## 2017-12-17 NOTE — Consult Note (Addendum)
Neurology Consultation  Reason for Consult: Stroke Referring Physician: Dr. Lynelle Doctor, Dr. Dartha Lodge  CC: Headache, difficulty focusing, confusion  History is obtained from: Patient, chart, patient's son  HPI: Darrell Perkins is a 67 y.o. male was a past medical history of paroxysmal atrial fibrillation, for which she is only on Plavix, congestive heart failure, hypertension, sleep apnea, who to the best of my history taking with him and his son was in his usual state of health about 2 to 3 weeks ago when he noticed some headache that went down his neck followed by inability to focus appropriately on things which made him think that he had had a stroke.  He was seen by his outpatient primary care physician and was given antibiotics for sinusitis with the presentation that he had with the primary care.  He did not have any focal tingling numbness or weakness but the primary care had a CT scan of the head done that showed a possible subacute stroke, for which an outpatient work-up including an echocardiogram was initiated. He says that he continues to have those symptoms and yesterday he started having the headache on the back of his head and neck and the fact that he has had a recent stroke with no work-up made him very concerned and brought him back to the ER.  He has been back to the ER yesterday with chest pain as well and was discharged home.  Denies any ongoing chest pain nausea vomiting shortness of breath.  Reports some degree of confusion which mainly is in ability to focus and some short-term memory loss that has been ongoing for a while.   LKW: At least 2 weeks ago tpa given?: no, outside the window Premorbid modified Rankin scale (mRS):1  ROS: Review of systems was performed and is negative except noted in the history of presenting illness.   Past Medical History:  Diagnosis Date  . Bronchitis    hx of   . CHF (congestive heart failure) (HCC)    EF 45%  . Degenerative joint disease of knee  11/26/2014  . GERD (gastroesophageal reflux disease)   . Hypertensive cardiovascular disease   . Kidney stones   . Pneumonia    hx of   . Rapid atrial fibrillation (HCC) 11/26/2014  . Sleep apnea    USES CPAP  . Typical atrial flutter (HCC) 10/2016    Family History  Problem Relation Age of Onset  . Hypertension Mother   . Diabetes Father   . Cerebrovascular Disease Father   . Alcohol abuse Father   . Hypertension Father   . COPD Father      Social History:   reports that he has never smoked. He has never used smokeless tobacco. He reports that he does not drink alcohol or use drugs.  Medications No current facility-administered medications for this encounter.   Exam: Current vital signs: BP (!) 150/89 (BP Location: Right Arm)   Pulse (!) 59   Temp 98.8 F (37.1 C) (Oral)   Resp 18   SpO2 98%  Vital signs in last 24 hours: Temp:  [98.8 F (37.1 C)] 98.8 F (37.1 C) (10/13 0222) Pulse Rate:  [56-67] 59 (10/13 0758) Resp:  [17-18] 18 (10/13 0715) BP: (141-150)/(77-89) 150/89 (10/13 0758) SpO2:  [95 %-100 %] 98 % (10/13 0758) GENERAL: Awake, alert in NAD HEENT: - Normocephalic and atraumatic, dry mm, no LN++, no Thyromegally LUNGS - Clear to auscultation bilaterally with no wheezes CV - S1S2 RRR, no m/r/g,  equal pulses bilaterally. ABDOMEN - Soft, nontender, nondistended with normoactive BS Ext: warm, well perfused, intact peripheral pulses, no edema  NEURO:  Mental Status: AA&Ox3  Language: speech is not dysarthric.  Naming, repetition, fluency, and comprehension intact. Cranial Nerves: PERRL EOMI, visual fields full, no facial asymmetry, facial sensation intact, hearing intact, tongue/uvula/soft palate midline, normal sternocleidomastoid and trapezius muscle strength. No evidence of tongue atrophy or fibrillations Motor: Symmetric 5/5 strength in all 4 extremities with no vertical drift. Tone: is normal and bulk is normal Sensation- Intact to light touch  bilaterally Coordination: FTN intact bilaterally although subtle slowness in movements on the left side. Gait- deferred  NIHSS-0  Labs I have reviewed labs in epic and the results pertinent to this consultation are: CBC    Component Value Date/Time   WBC 3.4 (L) 12/17/2017 0229   RBC 4.43 12/17/2017 0229   HGB 12.9 (L) 12/17/2017 0238   HCT 38.0 (L) 12/17/2017 0238   PLT 294 12/17/2017 0229   MCV 88.7 12/17/2017 0229   MCH 28.0 12/17/2017 0229   MCHC 31.6 12/17/2017 0229   RDW 12.5 12/17/2017 0229   LYMPHSABS 1.2 12/17/2017 0229   MONOABS 0.4 12/17/2017 0229   EOSABS 0.0 12/17/2017 0229   BASOSABS 0.0 12/17/2017 0229  CMP     Component Value Date/Time   NA 135 12/17/2017 0238   K 4.0 12/17/2017 0238   CL 100 12/17/2017 0238   CO2 24 12/17/2017 0229   GLUCOSE 114 (H) 12/17/2017 0238   BUN 29 (H) 12/17/2017 0238   CREATININE 2.30 (H) 12/17/2017 0238   CALCIUM 9.3 12/17/2017 0229   PROT 7.6 12/17/2017 0229   ALBUMIN 3.8 12/17/2017 0229   AST 19 12/17/2017 0229   ALT 17 12/17/2017 0229   ALKPHOS 49 12/17/2017 0229   BILITOT 0.7 12/17/2017 0229   GFRNONAA 31 (L) 12/17/2017 0229   GFRAA 36 (L) 12/17/2017 0229   Imaging I have reviewed the images obtained: MRI examination of the brain-restricted diffusion in the right posterior temporal/parietal white matter which probably was present on the CT scan from 5 days ago at least.  Chronic right posterior temporal cortex infarct, small edge of chronic microhemorrhage in the right occipital lobe.  Stroke work-up prior to presentation: 2D echocardiogram done on 12/15/2017 shows LVEF of 55 to 60%, grade 1 diastolic dysfunction, normal left atrial size  Assessment:  67 year old man with a history of atrial fibrillation, congestive heart failure and hypertension with at least 1 to 2-week history of nonspecific symptoms including headaches, inability to focus and possible confusion with a subacute stroke on MRI. He is on Plavix for  stroke prevention but I think with his history of paroxysmal atrial fibrillation and now strokes both acute/subacute on chronic, he should be on anticoagulation.  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 -No need to repeat echocardiogram. -CTA head and neck to evaluate head and neck vasculature. -Hemoglobin A 1C -Fasting lipid panel -Plavix 75 mg p.o. for now.  Anti-coagulation per stroke team recommendations the morning after work-up completion.  I would suspect that he would benefit from Eliquis. -PT, OT, speech therapy.  N.p.o. until cleared by bedside swallow evaluation with speech evolution. -Stroke team to follow.   Please page stroke NP/PA/MD (listed on AMION)  from 8am-4 pm as this patient will be followed by the stroke team  at this point.  -- Milon Dikes, MD [facility] Pager: [phone] If 7pm to 7am, please call on call as listed on AMION.    ADDENDUM CTA canceled due to Cr 2.3. MRA head w/o and carotid dopplers added. -- Milon Dikes, MD [facility] Pager: [phone] If 7pm to 7am, please call on call as listed on AMION.

## 2017-12-17 NOTE — ED Triage Notes (Signed)
Pt presents with concerns for burning sensation to R posterior neck that began at midnight tonight; denies known injury; pt reports stroke x 2 weeks with new memory loss; pt has no focal deficits, no unilateral weakness, no speech abnormality, no neglect; also denies CP, sob, abd pain

## 2017-12-17 NOTE — ED Notes (Signed)
Patient transported to MRI 

## 2017-12-17 NOTE — ED Notes (Signed)
Checked CBG 98, RN Tina informed

## 2017-12-17 NOTE — ED Provider Notes (Addendum)
[facility] EMERGENCY DEPARTMENT Provider Note   CSN: [id number] Arrival date & time: 12/17/17  0209  Time seen 05:40 AM   History   Chief Complaint Chief Complaint  Patient presents with  . skin burning  . Neck Injury  . Memory Loss    HPI Darrell Perkins is a 67 y.o. male.  HPI patient states October 5 he thought maybe he had a stroke.  He states since that time is been having trouble focusing and concentrating.  He has not had any problem walking and denies any weakness in his arms or legs.  His family report no difficulty understanding his speech however he seems to have difficulty sometimes expressing what he wants to say.  He states he saw his PCP on the seventh or eighth and he had an outpatient CT scan done.  He states he was told he had a stroke however his medications were not changed and he was not referred to neurology.  He states he continues to have some difficulty thinking and feels confused.  He states the day he thought he had the stroke he did have some pain in the top of his head and back of his neck but that is resolved.  He states about 1 AM this morning he had some burning sensation in the back of his neck that lasted about an hour and is gone now.  The numbness did not radiate into his hands and he had no weakness of his extremities.  He states he is never had that before.  He also complains of his head "feeling full" for several years.  His doctor started him on Levaquin when he saw him earlier in the week and his medication was changed to amoxicillin that he is still taking.  This is for presumed sinusitis.  PCP [name], MD   Past Medical History:  Diagnosis Date  . Bronchitis    hx of   . CHF (congestive heart failure) (HCC)    EF 45%  . 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 GERD (gastroesophageal reflux disease)   . Hypertensive cardiovascular disease   . Kidney stones   . Pneumonia    hx of   . Rapid atrial fibrillation  (HCC) 11/26/2014  . Sleep apnea    USES CPAP  . Typical atrial flutter (HCC) 10/2016    Patient Active Problem List   Diagnosis Date Noted  . Chronic pain of left knee 03/06/2017  . Unilateral primary osteoarthritis, left knee 03/06/2017  . Leukopenia 11/03/2016  . Atrial fibrillation with RVR (HCC) 11/02/2016  . Allergic rhinitis 11/26/2014  . Cutaneous eruption 11/26/2014  . Uncomplicated asthma 11/26/2014  . Asthma, extrinsic, without status asthmaticus 11/26/2014  . Nephrolithiasis 11/26/2014  . Cellulitis and abscess of leg 11/26/2014  . ED (erectile dysfunction) of organic origin 11/26/2014  . Benign essential HTN 11/26/2014  . Peripheral neuralgia 11/26/2014  . Cephalalgia 11/26/2014  . Tinea cruris 11/26/2014  . Folliculitis 11/26/2014  . Hypercholesterolemia without hypertriglyceridemia 11/26/2014  . Hematuria, microscopic 11/26/2014  . H/O adenomatous polyp of colon 11/26/2014  . Snoring 11/26/2014  . Difficulty staying awake 11/26/2014  . Essential hypertension 11/26/2014  . Combined fat and carbohydrate induced hyperlipemia 11/26/2014  . Tinea pedis 11/26/2014  . Erectile dysfunction due to arterial insufficiency 11/26/2014  . Perifolliculitis capitis abscedens 11/26/2014  . Other constipation 11/26/2014  . Chest pain 11/26/2014  . Other fatigue 11/26/2014  . Pain of right leg 11/26/2014  .  Lesion of lateral popliteal nerve 11/26/2014  . Chronic kidney disease, stage III (moderate) (HCC) 11/26/2014  . 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 Athlete's foot 11/26/2014  . Mixed hyperlipidemia 11/26/2014  . Adiposity 11/26/2014  . Polypharmacy 11/26/2014  . 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 Chest pain syndrome 11/26/2014  . Rapid atrial fibrillation (HCC) 11/26/2014  . OSA on CPAP 11/26/2014  . Encounter for general adult medical examination without abnormal findings 09/02/2014    Past Surgical History:  Procedure Laterality Date  . colonscopy      . CYSTOSCOPY WITH RETROGRADE PYELOGRAM, URETEROSCOPY AND STENT PLACEMENT Right 08/01/2014   Procedure: CYSTOSCOPY WITH RIGHT  RETROGRADE PYELOGRAM/RIGHT URETEROSCOPY HOLMIUMN LASER LITOTRIPSY  STENT PLACEMENT ;  Surgeon: Jerilee Field, MD;  Location: [facility];  Service: Urology;  Laterality: Right;  . URETEROSCOPY WITH HOLMIUM LASER LITHOTRIPSY Right 11/18/2016   Procedure: URETEROSCOPY WITH HOLMIUM LASER LITHOTRIPSY/STENT PLACEMENT;  Surgeon: Jerilee Field, MD;  Location: [facility];  Service: Urology;  Laterality: Right;        Home Medications    Prior to Admission medications   Medication Sig Start Date End Date Taking? Authorizing Provider  acetaminophen (TYLENOL) 650 MG CR tablet Take 650 mg by mouth 2 (two) times daily as needed for pain.    [provider]  albuterol (PROVENTIL HFA;VENTOLIN HFA) 108 (90 BASE) MCG/ACT inhaler Inhale 1-2 puffs into the lungs every 6 (six) hours as needed for wheezing. 04/14/12   Sunnie Nielsen, MD  amoxicillin-clavulanate (AUGMENTIN) 875-125 MG tablet Take 1 tablet by mouth 2 (two) times daily.    [provider]  Bee Pollen 580 MG CAPS Take 1,160 mg by mouth daily.     [provider]  budesonide-formoterol (SYMBICORT) 160-4.5 MCG/ACT inhaler Inhale 2 puffs into the lungs every other day.     [provider]  Cholecalciferol (VITAMIN D) 2000 units tablet Take 2,000 Units by mouth daily.    [provider]  clopidogrel (PLAVIX) 75 MG tablet Take 75 mg by mouth daily.    [provider]  ezetimibe (ZETIA) 10 MG tablet Take 10 mg by mouth daily. 11/25/17   [provider]  fluticasone (FLONASE) 50 MCG/ACT nasal spray Place 1 spray into both nostrils daily. 12/07/17   [provider]  hydrochlorothiazide (HYDRODIURIL) 12.5 MG tablet Take 12.5 mg by mouth daily.    [provider]  levofloxacin (LEVAQUIN) 500 MG tablet Take 500 mg by mouth daily. 12/07/17   [provider]   losartan (COZAAR) 100 MG tablet Take 100 mg by mouth daily. 09/18/16   [provider]  meloxicam (MOBIC) 15 MG tablet Take 15 mg by mouth daily as needed for pain.  09/01/16   [provider]  polyethylene glycol (MIRALAX / GLYCOLAX) packet Take 17 g by mouth daily.    [provider]    Family History Family History  Problem Relation Age of Onset  . Hypertension Mother   . Diabetes Father   . Cerebrovascular Disease Father   . Alcohol abuse Father   . Hypertension Father   . COPD Father     Social History Social History   Tobacco Use  . Smoking status: Never Smoker  . Smokeless tobacco: Never Used  Substance Use Topics  . Alcohol use: No  . Drug use: No     Allergies   Allopurinol; Aspirin; Prednisone; Rosuvastatin; Simvastatin; and Tuberculin tests   Review of Systems Review of Systems  All other systems reviewed and  are negative.    Physical Exam Updated Vital Signs BP (!) 149/82   Pulse 67   Temp 98.8 F (37.1 C) (Oral)   Resp 18   SpO2 100%   Vital signs normal    Physical Exam  Constitutional: He is oriented to person, place, and time. He appears well-developed and well-nourished.  Non-toxic appearance. He does not appear ill. No distress.  HENT:  Head: Normocephalic and atraumatic.  Right Ear: External ear normal.  Left Ear: External ear normal.  Nose: Nose normal. No mucosal edema or rhinorrhea.  Mouth/Throat: Oropharynx is clear and moist and mucous membranes are normal. No dental abscesses or uvula swelling.  Eyes: Pupils are equal, round, and reactive to light. Conjunctivae and EOM are normal.  Neck: Normal range of motion and full passive range of motion without pain. Neck supple.  Cardiovascular: Normal rate, regular rhythm and normal heart sounds. Exam reveals no gallop and no friction rub.  No murmur heard. Pulmonary/Chest: Effort normal and breath sounds normal. No respiratory distress. He has no wheezes. He has  no rhonchi. He has no rales. He exhibits no tenderness and no crepitus.  Abdominal: Soft. Normal appearance and bowel sounds are normal. He exhibits no distension. There is no tenderness. There is no rebound and no guarding.  Musculoskeletal: Normal range of motion. He exhibits no edema or tenderness.  Moves all extremities well.   Neurological: He is alert and oriented to person, place, and time. He has normal strength. No cranial nerve deficit.  Grips are equal, there is no pronator drift, there is no motor weakness of his upper or lower extremities.  Skin: Skin is warm, dry and intact. No rash noted. No erythema. No pallor.  Psychiatric: He has a normal mood and affect. His speech is normal and behavior is normal. His mood appears not anxious.  Nursing note and vitals reviewed.    ED Treatments / Results  Labs (all labs ordered are listed, but only abnormal results are displayed) Results for orders placed or performed during the hospital encounter of 12/17/17  Protime-INR  Result Value Ref Range   Prothrombin Time 13.3 11.4 - 15.2 seconds   INR 1.02   APTT  Result Value Ref Range   aPTT 32 24 - 36 seconds  CBC  Result Value Ref Range   WBC 3.4 (L) 4.0 - 10.5 K/uL   RBC 4.43 4.22 - 5.81 MIL/uL   Hemoglobin 12.4 (L) 13.0 - 17.0 g/dL   HCT 16.1 09.6 - 04.5 %   MCV 88.7 80.0 - 100.0 fL   MCH 28.0 26.0 - 34.0 pg   MCHC 31.6 30.0 - 36.0 g/dL   RDW 40.9 81.1 - 91.4 %   Platelets 294 150 - 400 K/uL   nRBC 0.0 0.0 - 0.2 %  Differential  Result Value Ref Range   Neutrophils Relative % 52 %   Neutro Abs 1.8 1.7 - 7.7 K/uL   Lymphocytes Relative 35 %   Lymphs Abs 1.2 0.7 - 4.0 K/uL   Monocytes Relative 11 %   Monocytes Absolute 0.4 0.1 - 1.0 K/uL   Eosinophils Relative 1 %   Eosinophils Absolute 0.0 0.0 - 0.5 K/uL   Basophils Relative 1 %   Basophils Absolute 0.0 0.0 - 0.1 K/uL   Immature Granulocytes 0 %   Abs Immature Granulocytes 0.01 0.00 - 0.07 K/uL  Comprehensive  metabolic panel  Result Value Ref Range   Sodium 134 (L) 135 - 145 mmol/L  Potassium 3.9 3.5 - 5.1 mmol/L   Chloride 99 98 - 111 mmol/L   CO2 24 22 - 32 mmol/L   Glucose, Bld 117 (H) 70 - 99 mg/dL   BUN 24 (H) 8 - 23 mg/dL   Creatinine, Ser 9.60 (H) 0.61 - 1.24 mg/dL   Calcium 9.3 8.9 - 45.4 mg/dL   Total Protein 7.6 6.5 - 8.1 g/dL   Albumin 3.8 3.5 - 5.0 g/dL   AST 19 15 - 41 U/L   ALT 17 0 - 44 U/L   Alkaline Phosphatase 49 38 - 126 U/L   Total Bilirubin 0.7 0.3 - 1.2 mg/dL   GFR calc non Af Amer 31 (L) >60 mL/min   GFR calc Af Amer 36 (L) >60 mL/min   Anion gap 11 5 - 15  I-stat troponin, ED  Result Value Ref Range   Troponin i, poc 0.01 0.00 - 0.08 ng/mL   Comment 3          CBG monitoring, ED  Result Value Ref Range   Glucose-Capillary 98 70 - 99 mg/dL   Comment 1 Notify RN   I-Stat Chem 8, ED  Result Value Ref Range   Sodium 135 135 - 145 mmol/L   Potassium 4.0 3.5 - 5.1 mmol/L   Chloride 100 98 - 111 mmol/L   BUN 29 (H) 8 - 23 mg/dL   Creatinine, Ser 0.98 (H) 0.61 - 1.24 mg/dL   Glucose, Bld 119 (H) 70 - 99 mg/dL   Calcium, Ion 1.47 8.29 - 1.40 mmol/L   TCO2 26 22 - 32 mmol/L   Hemoglobin 12.9 (L) 13.0 - 17.0 g/dL   HCT 56.2 (L) 13.0 - 86.5 %   Laboratory interpretation all normal except stable renal insufficiency, mild anemia    EKG None   ED ECG REPORT   Date: 12/17/2017  Rate: 62  Rhythm: normal sinus rhythm and premature atrial contractions (PAC)  QRS Axis: normal  Intervals: normal  ST/T Wave abnormalities: normal  Conduction Disutrbances:none  Narrative Interpretation:   Old EKG Reviewed: unchanged from Dec 16, 2017  I have personally reviewed the EKG tracing and agree with the computerized printout as noted.   Radiology   Mr Brain Wo Contrast  Result Date: 12/17/2017 CLINICAL DATA:  Acute onset of confusion. Speech difficulty 1 week ago. EXAM: MRI HEAD WITHOUT CONTRAST TECHNIQUE: Multiplanar, multiecho pulse sequences of the brain and  surrounding structures were obtained without intravenous contrast. COMPARISON:  CT head 12/12/2017 FINDINGS: Brain: Acute infarct in the right posterior temporal/parietal white matter. This corresponds to the area of hypodensity on prior CT. Adjacent but separate area of chronic infarction in the right posterior temporal cortex. Small area of chronic hemorrhage in the right occipital lobe which is remote from the above infarct. Mild chronic microvascular ischemic changes in the white matter. Negative for hydrocephalus. Negative for mass lesion. Small chronic infarct central pons. Vascular: Normal arterial flow voids Skull and upper cervical spine: Negative Sinuses/Orbits: Negative Other: None IMPRESSION: Acute infarct right posterior temporal/parietal white matter. This is a subacute infarct that was present on prior CT 5 days ago. Chronic infarct right posterior temporal cortex. Small area of chronic hemorrhage in the right occipital lobe. Electronically Signed   By: Marlan Palau M.D.   On: 12/17/2017 07:15     Ct Head Wo Contrast  Result Date: 12/13/2017 CLINICAL DATA:  Loss of focus and concentration with altered mental status. Headache.. IMPRESSION: Prior focal infarct at the junctions of the  right temporal, occipital, and parietal lobes with adjacent prior infarct in the right centrum semiovale posteriorly in this area. Mild patchy periventricular small vessel disease noted elsewhere. No acute infarct evident. No mass or hemorrhage. There is mucosal thickening in several ethmoid air cells. Electronically Signed   By: Bretta Bang III M.D.   On: 12/13/2017 09:57    Dg Chest 2 View  Result Date: 12/16/2017 CLINICAL DATA:  Shortness of breath  IMPRESSION: No active cardiopulmonary disease. Electronically Signed   By: Alcide Clever M.D.   On: 12/16/2017 09:09    Procedures .Critical Care Performed by: Devoria Albe, MD Authorized by: Devoria Albe, MD   Critical care provider statement:     Critical care time (minutes):  31   Critical care was necessary to treat or prevent imminent or life-threatening deterioration of the following conditions:  CNS failure or compromise   Critical care was time spent personally by me on the following activities:  Discussions with consultants, examination of patient, obtaining history from patient or surrogate, ordering and review of laboratory studies, ordering and review of radiographic studies and re-evaluation of patient's condition   (including critical care time)  December 15, 2017 ------------------------------------------------------------------- LV EF: 55% -   60%  ------------------------------------------------------------------- Indications:      I63.9 CVA.  ------------------------------------------------------------------- History:   PMH:  Asthma. Obstructive sleep apnea (CPAP). Bronchitis.  Atrial fibrillation.  Atrial flutter.  Congestive heart failure.  Risk factors:  Hypertension.  ------------------------------------------------------------------- Study Conclusions  - Left ventricle: The cavity size was normal. Wall thickness was   increased in a pattern of mild LVH. Systolic function was normal.   The estimated ejection fraction was in the range of 55% to 60%.   Wall motion was normal; there were no regional wall motion   abnormalities. Doppler parameters are consistent with abnormal   left ventricular relaxation (grade 1 diastolic dysfunction).  Impressions:  - Normal LV systolic function; mild LVH; mild diastolic   dysfunction.  Medications Ordered in ED Medications - No data to display   Initial Impression / Assessment and Plan / ED Course  I have reviewed the triage vital signs and the nursing notes.  Pertinent labs & imaging results that were available during my care of the patient were reviewed by me and considered in my medical decision making (see chart for details).     I reviewed patient's CT  scan from October 9.  It describes what appears to be old stroke.  MRI of the brain was ordered to assess the age of these abnormalities.  After reviewing patient's MRI scan neurology consult was called.  1:55 AM Dr. Jerrell Belfast, neurologist states to reviewing his MRI that he is still in the acute stroke stage.  He wants medicine to admit and he feels like patient needs to finish his stroke evaluation which had been started as an outpatient and he needs to be on a anticoagulant.  8:18 AM Dr. Juanda Chance, hospitalist will admit.  Final Clinical Impressions(s) / ED Diagnoses   Final diagnoses:  Cerebrovascular accident (CVA), unspecified mechanism [facility])    Plan admission  Devoria Albe, MD, Concha Pyo, MD 12/17/17 1610    Devoria Albe, MD 12/17/17 [phone]

## 2017-12-17 NOTE — H&P (Signed)
History and Physical  TRIG MCBRYAR VWU:[id number] DOB: 1950/09/10 DOA: 12/17/2017  Referring physician: ER physician PCP: [name], MD  Outpatient Specialists:  Patient coming from: Home  Chief Complaint: Headache, inability to concentrate and confusion.  HPI: Patient is a 67 year old African-American male, with past medical history significant for atrial fibrillation/atrial flutter, OSA, pneumonia, hypertension, GERD, congestive heart failure, degenerative disease of the knee and mild diastolic dysfunction.  According to the patient, his problem started 2 weeks ago with "light stroke".  Patient reported having developed headache and pain involving the back of the neck and had difficulty with focusing.  Patient was seen by the primary care provider, and was given antibiotics without any significant improvement.  Patient went back to the primary care provider for the same problem and had a CT scan of the head.  According to the patient, the CT of the head showed "mild stroke".  However, CT scan of the head without contrast done on 12/12/2017 and documented in our system revealed "The ventricles are normal in size and configuration. There is no intracranial mass, hemorrhage, extra-axial fluid collection, or midline shift.  There is evidence of a prior infarct at the junction of the right posterior temporal, lateral occipital, and inferior parietal lobes.  There is prior focal infarct in the adjacent centrum semiovale posteriorly on the right. Elsewhere there is patchy small vessel disease in the centra semiovale bilaterally. No acute appearing infarct is evident. Brain parenchyma elsewhere appears unremarkable".  According to the patient, confusion and difficulty concentrating persisted after the CT scan.  Patient was seen at the emergency room 2 days ago and was discharged home.  Patient presented again last night with difficulty concentrating, confusion, not feeling right and fluttering of the heart.   MRI of the brain done on presentation revealed "Acute infarct right posterior temporal/parietal white matter. This is a subacute infarct that was present on prior CT 5 days ago.  Chronic infarct right posterior temporal cortex. Small area of chronic hemorrhage in the right occipital lobe".  Hospitalist team has been asked to admit patient for further assessment and management.  No fever or chills, none cough, no chest pain, no URI symptoms, no GI symptoms, no urinary symptoms.  Patient denied speech problems.  Patient also denied focal motor weakness, however, left lower extremity is noted to be weaker than the right, the patient attributes this to the degenerative joint disease.  ED Course: Kindly see above. Pertinent labs: Kindly see imaging studies above.  Chemistry reveals sodium of 134, potassium of 3.9, chloride 99, CO2 of 24, BUN of 24 and creatinine of 2.09.  Blood sugar is 117.  Cardiac enzymes and negative.  CBC reveals WBC of 3.4, hemoglobin of 12.4, hematocrit of 39.3, MCV of 88.7 with platelet count of 294. EKG: Independently reviewed.  Imaging: independently reviewed.   Review of Systems:  Negative for fever, visual changes, sore throat, rash, new muscle aches, chest pain, SOB, dysuria, bleeding, n/v/abdominal pain.  Past Medical History:  Diagnosis Date  . Bronchitis    hx of   . CHF (congestive heart failure) (HCC)    EF 45%  . 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 GERD (gastroesophageal reflux disease)   . Hypertensive cardiovascular disease   . Kidney stones   . Pneumonia    hx of   . Rapid atrial fibrillation (HCC) 11/26/2014  . Sleep apnea    USES CPAP  . Typical atrial flutter (HCC) 10/2016    Past Surgical  History:  Procedure Laterality Date  . colonscopy     . CYSTOSCOPY WITH RETROGRADE PYELOGRAM, URETEROSCOPY AND STENT PLACEMENT Right 08/01/2014   Procedure: CYSTOSCOPY WITH RIGHT  RETROGRADE PYELOGRAM/RIGHT URETEROSCOPY HOLMIUMN LASER LITOTRIPSY  STENT  PLACEMENT ;  Surgeon: Jerilee Field, MD;  Location: [facility];  Service: Urology;  Laterality: Right;  . URETEROSCOPY WITH HOLMIUM LASER LITHOTRIPSY Right 11/18/2016   Procedure: URETEROSCOPY WITH HOLMIUM LASER LITHOTRIPSY/STENT PLACEMENT;  Surgeon: Jerilee Field, MD;  Location: [facility];  Service: Urology;  Laterality: Right;     reports that he has never smoked. He has never used smokeless tobacco. He reports that he does not drink alcohol or use drugs.  Allergies  Allergen Reactions  . Allopurinol Other (See Comments)    "Upset stomach"  . Aspirin Other (See Comments)    Upset stomach  . Prednisone Other (See Comments)    "Irritable"  . Rosuvastatin Other (See Comments)    Mental disturbance  . Simvastatin Other (See Comments)    Tired and achy  . Tuberculin Tests Other (See Comments)    Intense local reaction    Family History  Problem Relation Age of Onset  . Hypertension Mother   . Diabetes Father   . Cerebrovascular Disease Father   . Alcohol abuse Father   . Hypertension Father   . COPD Father      Prior to Admission medications   Medication Sig Start Date End Date Taking? Authorizing Provider  acetaminophen (TYLENOL) 650 MG CR tablet Take 650 mg by mouth 2 (two) times daily as needed for pain.    [provider]  albuterol (PROVENTIL HFA;VENTOLIN HFA) 108 (90 BASE) MCG/ACT inhaler Inhale 1-2 puffs into the lungs every 6 (six) hours as needed for wheezing. 04/14/12   Sunnie Nielsen, MD  amoxicillin-clavulanate (AUGMENTIN) 875-125 MG tablet Take 1 tablet by mouth 2 (two) times daily.    [provider]  Bee Pollen 580 MG CAPS Take 1,160 mg by mouth daily.     [provider]  budesonide-formoterol (SYMBICORT) 160-4.5 MCG/ACT inhaler Inhale 2 puffs into the lungs every other day.     [provider]  Cholecalciferol (VITAMIN D) 2000 units tablet Take 2,000 Units by mouth daily.    [provider]  clopidogrel (PLAVIX) 75 MG  tablet Take 75 mg by mouth daily.    [provider]  ezetimibe (ZETIA) 10 MG tablet Take 10 mg by mouth daily. 11/25/17   [provider]  fluticasone (FLONASE) 50 MCG/ACT nasal spray Place 1 spray into both nostrils daily. 12/07/17   [provider]  hydrochlorothiazide (HYDRODIURIL) 12.5 MG tablet Take 12.5 mg by mouth daily.    [provider]  levofloxacin (LEVAQUIN) 500 MG tablet Take 500 mg by mouth daily. 12/07/17   [provider]  losartan (COZAAR) 100 MG tablet Take 100 mg by mouth daily. 09/18/16   [provider]  meloxicam (MOBIC) 15 MG tablet Take 15 mg by mouth daily as needed for pain.  09/01/16   [provider]  polyethylene glycol (MIRALAX / GLYCOLAX) packet Take 17 g by mouth daily.    [provider]    Physical Exam: Vitals:   12/17/17 0222 12/17/17 0715 12/17/17 0758  BP: (!) 149/82 (!) 147/84 (!) 150/89  Pulse: 67 65 (!) 59  Resp: 18 18   Temp: 98.8 F (37.1 C)    TempSrc: Oral    SpO2: 100% 100% 98%   Constitutional:  . Appears calm and  comfortable Eyes:  . No pallor. No jaundice.  ENMT:  . external ears, nose appear normal Neck:  . Neck is supple. No JVD Respiratory:  . CTA bilaterally, no w/r/r.  . Respiratory effort normal. No retractions or accessory muscle use Cardiovascular:  . S1S2 . No LE extremity edema   Abdomen:  . Abdomen is soft and non tender. Organs are difficult to assess. Neurologic:  . Awake and alert. . Moves all limbs.  However, lower extremity is weaker than the right lower extremity (Patient attributes this to the degenerative disease of the left knee)  Wt Readings from Last 3 Encounters:  12/16/17 117.9 kg  11/09/17 117.9 kg  11/30/16 119.3 kg    I have personally reviewed following labs and imaging studies  Labs on Admission:  CBC: Recent Labs  Lab 12/16/17 0815 12/17/17 0229 12/17/17 0238  WBC 2.8* 3.4*  --   NEUTROABS  --  1.8  --   HGB  12.5* 12.4* 12.9*  HCT 39.5 39.3 38.0*  MCV 90.6 88.7  --   PLT 260 294  --    Basic Metabolic Panel: Recent Labs  Lab 12/16/17 0815 12/17/17 0229 12/17/17 0238  NA 132* 134* 135  K 4.0 3.9 4.0  CL 100 99 100  CO2 23 24  --   GLUCOSE 116* 117* 114*  BUN 27* 24* 29*  CREATININE 2.06* 2.09* 2.30*  CALCIUM 9.1 9.3  --    Liver Function Tests: Recent Labs  Lab 12/17/17 0229  AST 19  ALT 17  ALKPHOS 49  BILITOT 0.7  PROT 7.6  ALBUMIN 3.8   No results for input(s): LIPASE, AMYLASE in the last 168 hours. No results for input(s): AMMONIA in the last 168 hours. Coagulation Profile: Recent Labs  Lab 12/17/17 0229  INR 1.02   Cardiac Enzymes: No results for input(s): CKTOTAL, CKMB, CKMBINDEX, TROPONINI in the last 168 hours. BNP (last 3 results) No results for input(s): PROBNP in the last 8760 hours. HbA1C: No results for input(s): HGBA1C in the last 72 hours. CBG: Recent Labs  Lab 12/17/17 0426  GLUCAP 98   Lipid Profile: No results for input(s): CHOL, HDL, LDLCALC, TRIG, CHOLHDL, LDLDIRECT in the last 72 hours. Thyroid Function Tests: No results for input(s): TSH, T4TOTAL, FREET4, T3FREE, THYROIDAB in the last 72 hours. Anemia Panel: No results for input(s): VITAMINB12, FOLATE, FERRITIN, TIBC, IRON, RETICCTPCT in the last 72 hours. Urine analysis:    Component Value Date/Time   COLORURINE YELLOW 11/12/2016 1800   APPEARANCEUR CLEAR 11/12/2016 1800   LABSPEC 1.026 11/12/2016 1800   PHURINE 5.0 11/12/2016 1800   GLUCOSEU NEGATIVE 11/12/2016 1800   HGBUR NEGATIVE 11/12/2016 1800   BILIRUBINUR NEGATIVE 11/12/2016 1800   KETONESUR NEGATIVE 11/12/2016 1800   PROTEINUR 30 (A) 11/12/2016 1800   UROBILINOGEN 0.2 11/12/2016 1249   NITRITE NEGATIVE 11/12/2016 1800   LEUKOCYTESUR NEGATIVE 11/12/2016 1800   Sepsis Labs: @LABRCNTIP (procalcitonin:4,lacticidven:4) )No results found for this or any previous visit (from the past 240 hour(s)).    Radiological Exams  on Admission: Dg Chest 2 View  Result Date: 12/16/2017 CLINICAL DATA:  Shortness of breath EXAM: CHEST - 2 VIEW COMPARISON:  11/02/2016 FINDINGS: The heart size and mediastinal contours are within normal limits. Both lungs are clear. The visualized skeletal structures are unremarkable. IMPRESSION: No active cardiopulmonary disease. Electronically Signed   By: Alcide Clever M.D.   On: 12/16/2017 09:09   Mr Brain Wo Contrast  Result Date: 12/17/2017 CLINICAL DATA:  Acute onset  of confusion. Speech difficulty 1 week ago. EXAM: MRI HEAD WITHOUT CONTRAST TECHNIQUE: Multiplanar, multiecho pulse sequences of the brain and surrounding structures were obtained without intravenous contrast. COMPARISON:  CT head 12/12/2017 FINDINGS: Brain: Acute infarct in the right posterior temporal/parietal white matter. This corresponds to the area of hypodensity on prior CT. Adjacent but separate area of chronic infarction in the right posterior temporal cortex. Small area of chronic hemorrhage in the right occipital lobe which is remote from the above infarct. Mild chronic microvascular ischemic changes in the white matter. Negative for hydrocephalus. Negative for mass lesion. Small chronic infarct central pons. Vascular: Normal arterial flow voids Skull and upper cervical spine: Negative Sinuses/Orbits: Negative Other: None IMPRESSION: Acute infarct right posterior temporal/parietal white matter. This is a subacute infarct that was present on prior CT 5 days ago. Chronic infarct right posterior temporal cortex. Small area of chronic hemorrhage in the right occipital lobe. Electronically Signed   By: Marlan Palau M.D.   On: 12/17/2017 07:15    EKG: Independently reviewed.   Active Problems:   * No active hospital problems. *   Assessment/Plan Acute CVA: Neurology input is appreciated. CVA work-up is in progress. Plavix advised. Will defer to neurology team.  Paroxysmal atrial fibrillation: Rate  controlled. Kindly see Neurology consult note regarding anticoagulation.  OSA: CPAP  Hypertension: Permissive Hypertension.  Grade 1 diastolic dysfunction: Stable. No symptoms.  CKD 3: Stable. Continue to monitor.  DVT prophylaxis: SCD Code Status: Full code Family Communication:  Disposition Plan: Will depend on hospital course Consults called: Neurology Admission status: Inpatient  Time spent: 60 minutes.  Berton Mount, MD  [facility] Pager #: [phone] 7PM-7AM contact night coverage as above  12/17/2017, 8:19 AM

## 2017-12-17 NOTE — Evaluation (Signed)
Occupational Therapy Evaluation Patient Details Name: Darrell Perkins MRN: [id number] DOB: March 14, 1950 Today's Date: 12/17/2017    History of Present Illness Darrell Perkins is a 67 yo male admitted with confusion and headache, CVA 5 days ago and acute ischemic CVA   Clinical Impression   Pt admitted with above dx, currently at baseline with ADLs and functional mobility. Pt reports no deficits following CVA other than some lingering confusion, however none noted on eval. Pt given edu re fall risk reduction, use of AE/AD in the home, and shown options re tub bench options d/t pt expressing interest. Pt edu re stroke s/s. No further OT needs, OT will sign off.     Follow Up Recommendations  No OT follow up    Equipment Recommendations  None recommended by OT       Precautions / Restrictions Precautions Precautions: None Restrictions Weight Bearing Restrictions: No      Mobility Bed Mobility Overal bed mobility: Independent                Transfers Overall transfer level: Independent Equipment used: None                  Balance Overall balance assessment: Modified Independent                                         ADL either performed or assessed with clinical judgement   ADL Overall ADL's : At baseline;Independent                                             Vision Baseline Vision/History: Wears glasses Wears Glasses: At all times Patient Visual Report: No change from baseline Vision Assessment?: No apparent visual deficits            Pertinent Vitals/Pain Pain Assessment: No/denies pain     Hand Dominance Right   Extremity/Trunk Assessment Upper Extremity Assessment Upper Extremity Assessment: Overall WFL for tasks assessed   Lower Extremity Assessment Lower Extremity Assessment: Overall WFL for tasks assessed   Cervical / Trunk Assessment Cervical / Trunk Assessment: Normal   Communication  Communication Communication: No difficulties   Cognition Arousal/Alertness: Awake/alert Behavior During Therapy: WFL for tasks assessed/performed Overall Cognitive Status: Within Functional Limits for tasks assessed                                          Home Living Family/patient expects to be discharged to:: Private residence Living Arrangements: Spouse/significant other;Other (Comment)(grandson) Available Help at Discharge: Family;Available PRN/intermittently Type of Home: House Home Access: Stairs to enter     Home Layout: One level     Bathroom Shower/Tub: Chief Strategy Officer: Standard     Home Equipment: Shower seat          Prior Functioning/Environment Level of Independence: Independent                          OT Goals(Current goals can be found in the care plan section) Acute Rehab OT Goals Patient Stated Goal: "Get everything figured out" OT Goal Formulation: All assessment and education complete, DC therapy  AM-PAC PT "6 Clicks" Daily Activity     Outcome Measure Help from another person eating meals?: None Help from another person taking care of personal grooming?: None Help from another person toileting, which includes using toliet, bedpan, or urinal?: None Help from another person bathing (including washing, rinsing, drying)?: None Help from another person to put on and taking off regular upper body clothing?: None Help from another person to put on and taking off regular lower body clothing?: None 6 Click Score: 24   End of Session Nurse Communication: Mobility status  Activity Tolerance: Patient tolerated treatment well Patient left: in bed;with call bell/phone within reach                   Time: 1610-9604 OT Time Calculation (min): 20 min Charges:  OT General Charges $OT Visit: 1 Visit OT Evaluation $OT Eval Low Complexity: 1 Low   Crissie Reese OTR/L 12/17/2017, 11:00 AM

## 2017-12-17 NOTE — Progress Notes (Signed)
PT Cancellation Note  Patient Details Name: Darrell Perkins MRN: [id number] DOB: 1950-10-27   Cancelled Treatment:    Reason Eval/Treat Not Completed: PT screened, no needs identified, will sign off. Pt reports no difficulty with mobility and no concerns about safety.  Dressed in street clothes and hopeful to d/c. Please reconsult if status changes.  Thank you.   Narda Amber [facility] 12/17/2017, 1:29 PM

## 2017-12-18 ENCOUNTER — Other Ambulatory Visit (HOSPITAL_COMMUNITY): Payer: Medicare HMO

## 2017-12-18 ENCOUNTER — Inpatient Hospital Stay (HOSPITAL_COMMUNITY): Payer: Medicare HMO

## 2017-12-18 ENCOUNTER — Other Ambulatory Visit: Payer: Self-pay

## 2017-12-18 DIAGNOSIS — I4892 Unspecified atrial flutter: Secondary | ICD-10-CM

## 2017-12-18 DIAGNOSIS — I639 Cerebral infarction, unspecified: Secondary | ICD-10-CM

## 2017-12-18 LAB — HEMOGLOBIN A1C
Hgb A1c MFr Bld: 6.6 % — ABNORMAL HIGH (ref 4.8–5.6)
Mean Plasma Glucose: 142.72 mg/dL

## 2017-12-18 LAB — BASIC METABOLIC PANEL
Anion gap: 8 (ref 5–15)
BUN: 21 mg/dL (ref 8–23)
CO2: 24 mmol/L (ref 22–32)
Calcium: 9.1 mg/dL (ref 8.9–10.3)
Chloride: 103 mmol/L (ref 98–111)
Creatinine, Ser: 1.79 mg/dL — ABNORMAL HIGH (ref 0.61–1.24)
GFR calc Af Amer: 43 mL/min — ABNORMAL LOW (ref >60)
GFR calc non Af Amer: 37 mL/min — ABNORMAL LOW (ref >60)
Glucose, Bld: 114 mg/dL — ABNORMAL HIGH (ref 70–99)
Potassium: 4.1 mmol/L (ref 3.5–5.1)
Sodium: 135 mmol/L (ref 135–145)

## 2017-12-18 LAB — URINALYSIS, ROUTINE W REFLEX MICROSCOPIC
Bacteria, UA: NONE SEEN
Bilirubin Urine: NEGATIVE
Glucose, UA: NEGATIVE mg/dL
Ketones, ur: NEGATIVE mg/dL
Leukocytes, UA: NEGATIVE
Nitrite: NEGATIVE
Protein, ur: NEGATIVE mg/dL
Specific Gravity, Urine: 1.009 (ref 1.005–1.030)
pH: 5 (ref 5.0–8.0)

## 2017-12-18 LAB — LIPID PANEL
Cholesterol: 181 mg/dL (ref 0–200)
HDL: 30 mg/dL — ABNORMAL LOW (ref >40)
LDL Cholesterol: 127 mg/dL — ABNORMAL HIGH (ref 0–99)
Total CHOL/HDL Ratio: 6 RATIO
Triglycerides: 119 mg/dL (ref <150)
VLDL: 24 mg/dL (ref 0–40)

## 2017-12-18 LAB — HIV ANTIBODY (ROUTINE TESTING W REFLEX): HIV Screen 4th Generation wRfx: NONREACTIVE

## 2017-12-18 LAB — CBC
HCT: 34.9 % — ABNORMAL LOW (ref 39.0–52.0)
Hemoglobin: 11.5 g/dL — ABNORMAL LOW (ref 13.0–17.0)
MCH: 28.7 pg (ref 26.0–34.0)
MCHC: 33 g/dL (ref 30.0–36.0)
MCV: 87 fL (ref 80.0–100.0)
Platelets: 230 10*3/uL (ref 150–400)
RBC: 4.01 MIL/uL — ABNORMAL LOW (ref 4.22–5.81)
RDW: 12.3 % (ref 11.5–15.5)
WBC: 3.4 10*3/uL — ABNORMAL LOW (ref 4.0–10.5)
nRBC: 0 % (ref 0.0–0.2)

## 2017-12-18 MED ORDER — APIXABAN 5 MG PO TABS
5.0000 mg | ORAL_TABLET | Freq: Two times a day (BID) | ORAL | Status: DC
  Administered 2017-12-18: 5 mg via ORAL
  Filled 2017-12-18: qty 1

## 2017-12-18 MED ORDER — APIXABAN 5 MG PO TABS: 5.0000 mg | ORAL_TABLET | Freq: Two times a day (BID) | ORAL | 0 refills | Status: DC

## 2017-12-18 NOTE — Progress Notes (Signed)
VASCULAR LAB PRELIMINARY  PRELIMINARY  PRELIMINARY  PRELIMINARY  Carotid duplex completed.    Preliminary report:  1-39% ICA plaquing vertebral artery flow is antegrade.   Michale Emmerich, RVT 12/18/2017, 1:01 PM

## 2017-12-18 NOTE — Progress Notes (Signed)
ANTICOAGULATION CONSULT NOTE - Initial Consult  Pharmacy Consult for Eliquis Indication: h/o afib and flutter with new CVA  Allergies  Allergen Reactions  . Allopurinol Other (See Comments)    "Upset stomach"  . Aspirin Other (See Comments)    Upset stomach  . Prednisone Other (See Comments)    "Irritable"  . Rosuvastatin Other (See Comments)    Mental disturbance  . Simvastatin Other (See Comments)    Tired and achy  . Tuberculin Tests Other (See Comments)    Intense local reaction    Patient Measurements:    Vital Signs: Temp: 98.3 F (36.8 C) (10/14 1239) Temp Source: Oral (10/14 1239) BP: 143/75 (10/14 1239) Pulse Rate: 63 (10/14 1239)  Labs: Recent Labs    12/16/17 0815 12/17/17 0229 12/17/17 0238 12/18/17 0339  HGB 12.5* 12.4* 12.9* 11.5*  HCT 39.5 39.3 38.0* 34.9*  PLT 260 294  --  230  APTT  --  32  --   --   LABPROT  --  13.3  --   --   INR  --  1.02  --   --   CREATININE 2.06* 2.09* 2.30* 1.79*    Estimated Creatinine Clearance: 53.5 mL/min (A) (by C-G formula based on SCr of 1.79 mg/dL (H)).   Medical History: Past Medical History:  Diagnosis Date  . Bronchitis    hx of   . CHF (congestive heart failure) (HCC)    EF 45%  . 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 GERD (gastroesophageal reflux disease)   . Hypertensive cardiovascular disease   . Kidney stones   . Pneumonia    hx of   . Rapid atrial fibrillation (HCC) 11/26/2014  . Sleep apnea    USES CPAP  . Typical atrial flutter (HCC) 10/2016    Assessment: Anticoag: h/o afib/flutter. No anticoag PTA. CHA2DS2-VASc Score = at least 5. Age 67 y/o, 118kg, Scr 1.798, CrCl 53  Goal of Therapy:  Therapeutic oral anticoagulation   Plan:  Eliquis 5mg  BID  Darrell Perkins S. Merilynn Finland, PharmD, BCPS Clinical Staff Pharmacist [phone]  Darrell Perkins 12/18/2017,2:46 PM

## 2017-12-18 NOTE — Consult Note (Addendum)
ELECTROPHYSIOLOGY CONSULT NOTE    Patient ID: JAHMEZ BILY MRN: [id number], DOB/AGE: [birth year] 67 y.o.  Admit date: 12/17/2017 Date of Consult: 12/18/2017  Primary Physician: [name], MD Primary Cardiologist: Croitoru Electrophysiologist: Allred  Patient Profile: Darrell Perkins is a 67 y.o. 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  HPI:  Darrell Perkins is a 67 y.o. male with the above past medical history. He was seen by Dr Johney Frame in the past for evaluation of flutter ablation but declined at the time because he was dealing with recurrent kidney stones needing procedures and was told that he would need to stop anticoagulation for procedure.  He is relatively asymptomatic with his atrial flutter and is unsure if he has had recurrence.  He is compliant with CPAP and overall feels well. 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 He had persistent trouble with short term memory loss and "feeling like he was losing his mind". He presented to the ER for evaluation and was admitted for evaluation by neurology.   He denies chest pain, palpitations, dyspnea, PND, orthopnea, nausea, vomiting, dizziness, syncope, edema, weight gain, or early satiety.  Past Medical History:  Diagnosis Date  . Bronchitis    hx of   . CHF (congestive heart failure) (HCC)    EF 45%  . 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 GERD (gastroesophageal reflux disease)   . Hypertensive cardiovascular disease   . Kidney stones   . Pneumonia    hx of   . Rapid atrial fibrillation (HCC) 11/26/2014  . Sleep apnea    USES CPAP  . Typical atrial flutter (HCC) 10/2016     Surgical History:  Past Surgical History:  Procedure Laterality Date  . colonscopy     . CYSTOSCOPY WITH RETROGRADE PYELOGRAM,  URETEROSCOPY AND STENT PLACEMENT Right 08/01/2014   Procedure: CYSTOSCOPY WITH RIGHT  RETROGRADE PYELOGRAM/RIGHT URETEROSCOPY HOLMIUMN LASER LITOTRIPSY  STENT PLACEMENT ;  Surgeon: Jerilee Field, MD;  Location: [facility];  Service: Urology;  Laterality: Right;  . URETEROSCOPY WITH HOLMIUM LASER LITHOTRIPSY Right 11/18/2016   Procedure: URETEROSCOPY WITH HOLMIUM LASER LITHOTRIPSY/STENT PLACEMENT;  Surgeon: Jerilee Field, MD;  Location: [facility];  Service: Urology;  Laterality: Right;     Medications Prior to Admission  Medication Sig Dispense Refill Last Dose  . acetaminophen (TYLENOL) 650 MG CR tablet Take 650 mg by mouth 2 (two) times daily as needed for pain.   12/15/2017  . albuterol (PROVENTIL HFA;VENTOLIN HFA) 108 (90 BASE) MCG/ACT inhaler Inhale 1-2 puffs into the lungs every 6 (six) hours as needed for wheezing. 1 Inhaler 0 unk at prn  . amoxicillin-clavulanate (AUGMENTIN) 875-125 MG tablet Take 1 tablet by mouth 2 (two) times daily.   12/16/2017 at Unknown time  . Bee Pollen 580 MG CAPS Take 1,160 mg by mouth daily.    12/16/2017 at Unknown time  . budesonide-formoterol (SYMBICORT) 160-4.5 MCG/ACT inhaler Inhale 2 puffs into the lungs every other day.    12/15/2017  . Cholecalciferol (VITAMIN D) 2000 units tablet Take 2,000 Units by mouth daily.   Past Week at Unknown time  . clopidogrel (PLAVIX) 75 MG tablet Take 75 mg by mouth daily.   12/16/2017 at am  . ezetimibe (ZETIA) 10 MG tablet Take 10 mg  by mouth daily.  3 Past Month at Unknown time  . fluticasone (FLONASE) 50 MCG/ACT nasal spray Place 1 spray into both nostrils daily.  2 12/16/2017 at Unknown time  . hydrochlorothiazide (HYDRODIURIL) 12.5 MG tablet Take 12.5 mg by mouth daily.   12/15/2017  . losartan (COZAAR) 100 MG tablet Take 100 mg by mouth daily.  3 12/16/2017 at Unknown time  . meloxicam (MOBIC) 15 MG tablet Take 15 mg by mouth daily as needed for pain.   1 Past Month at Unknown time  . polyethylene glycol (MIRALAX /  GLYCOLAX) packet Take 17 g by mouth daily as needed for mild constipation.    12/16/2017 at Unknown time    Inpatient Medications:  . cholecalciferol  2,000 Units Oral Daily  . ezetimibe  10 mg Oral Daily  . fluticasone  1 spray Each Nare Daily  . mometasone-formoterol  2 puff Inhalation BID    Allergies:  Allergies  Allergen Reactions  . Allopurinol Other (See Comments)    "Upset stomach"  . Aspirin Other (See Comments)    Upset stomach  . Prednisone Other (See Comments)    "Irritable"  . Rosuvastatin Other (See Comments)    Mental disturbance  . Simvastatin Other (See Comments)    Tired and achy  . Tuberculin Tests Other (See Comments)    Intense local reaction    Social History   Socioeconomic History  . Marital status: Married    Spouse name: Not on file  . Number of children: Not on file  . Years of education: Not on file  . Highest education level: Not on file  Occupational History  . Not on file  Social Needs  . Financial resource strain: Not on file  . Food insecurity:    Worry: Not on file    Inability: Not on file  . Transportation needs:    Medical: Not on file    Non-medical: Not on file  Tobacco Use  . Smoking status: Never Smoker  . Smokeless tobacco: Never Used  Substance and Sexual Activity  . Alcohol use: No  . Drug use: No  . Sexual activity: Not on file  Lifestyle  . Physical activity:    Days per week: Not on file    Minutes per session: Not on file  . Stress: Not on file  Relationships  . Social connections:    Talks on phone: Not on file    Gets together: Not on file    Attends religious service: Not on file    Active member of club or organization: Not on file    Attends meetings of clubs or organizations: Not on file    Relationship status: Not on file  . Intimate partner violence:    Fear of current or ex partner: Not on file    Emotionally abused: Not on file    Physically abused: Not on file    Forced sexual activity: Not  on file  Other Topics Concern  . 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 Hypertension Mother   . Diabetes Father   . Cerebrovascular Disease Father   . Alcohol abuse Father   . Hypertension Father   . COPD Father      Review of Systems: All other systems reviewed and are otherwise negative except as noted above.  Physical Exam:  Vitals:   12/18/17 0318 12/18/17 0758 12/18/17 0922 12/18/17 1239  BP: 134/72 140/83  (!) 143/75  Pulse: (!) 54 (!) 49 (!) 51 63  Resp: 16 20 18 20   Temp: 98.3 F (36.8 C) 98.6 F (37 C)  98.3 F (36.8 C)  TempSrc: Oral Oral  Oral  SpO2: 99% 100% 98% 100%    GEN- The patient is well appearing, alert and oriented x 3 today.   HEENT: normocephalic, atraumatic; sclera clear, conjunctiva pink; hearing intact; oropharynx clear; neck supple Lungs- Clear to ausculation bilaterally, normal work of breathing.  No wheezes, rales, rhonchi Heart- Regular rate and rhythm, no murmurs, rubs or gallops GI- soft, non-tender, non-distended, bowel sounds present Extremities- no clubbing, cyanosis, or edema; DP/PT/radial pulses 2+ bilaterally MS- no significant deformity or atrophy Skin- warm and dry, no rash or lesion Psych- euthymic mood, full affect Neuro- strength and sensation are intact  Labs:   Lab Results  Component Value Date   WBC 3.4 (L) 12/18/2017   HGB 11.5 (L) 12/18/2017   HCT 34.9 (L) 12/18/2017   MCV 87.0 12/18/2017   PLT 230 12/18/2017    Recent Labs  Lab 12/17/17 0229  12/18/17 0339  NA 134*   < > 135  K 3.9   < > 4.1  CL 99   < > 103  CO2 24  --  24  BUN 24*   < > 21  CREATININE 2.09*   < > 1.79*  CALCIUM 9.3  --  9.1  PROT 7.6  --   --   BILITOT 0.7  --   --   ALKPHOS 49  --   --   ALT 17  --   --   AST 19  --   --   GLUCOSE 117*   < > 114*   < > = values in this interval not displayed.        Radiology/Studies: Dg Chest 2 View  Result Date: 12/16/2017 CLINICAL DATA:  Shortness of breath EXAM: CHEST - 2 VIEW COMPARISON:  11/02/2016 FINDINGS: The heart size and mediastinal contours are within normal limits. Both lungs are clear. The visualized skeletal structures are unremarkable. IMPRESSION: No active cardiopulmonary disease. Electronically Signed   By: Alcide Clever M.D.   On: 12/16/2017 09:09   Ct Head Wo Contrast  Result Date: 12/13/2017 CLINICAL DATA:  Loss of focus and concentration with altered mental status. Headache. EXAM: CT HEAD WITHOUT CONTRAST TECHNIQUE: Contiguous axial images were obtained from the base of the skull through the vertex without intravenous contrast. COMPARISON:  None. FINDINGS: Brain: The ventricles are normal in size and configuration. There is no intracranial mass, hemorrhage, extra-axial fluid collection, or midline shift. There is evidence of a prior infarct at the junction of the right posterior temporal, lateral occipital, and inferior parietal lobes. There is prior focal infarct in the adjacent centrum semiovale posteriorly on the right. Elsewhere there is patchy small vessel disease in the centra semiovale bilaterally. No acute appearing infarct is evident. Brain parenchyma elsewhere appears unremarkable. Vascular: No evident hyperdense vessel. There is no appreciable vascular calcification. Skull: Bony calvarium appears intact. Sinuses/Orbits: There is mucosal thickening in several ethmoid air cells. Other paranasal sinuses are clear. Orbits appear symmetric bilaterally. Other: Mastoid air cells are clear. IMPRESSION: Prior focal infarct at the junctions of the right temporal, occipital, and parietal lobes with adjacent prior infarct in the right centrum semiovale posteriorly in this area. Mild patchy periventricular small vessel disease noted elsewhere.  No acute infarct evident. No mass or hemorrhage. There is mucosal thickening in several ethmoid air cells.  Electronically Signed   By: Bretta Bang III M.D.   On: 12/13/2017 09:57   Mr Maxine Glenn Head Wo Contrast  Result Date: 12/17/2017 CLINICAL DATA:  Stroke follow-up EXAM: MRA HEAD WITHOUT CONTRAST TECHNIQUE: Angiographic images of the Circle of Willis were obtained using MRA technique without intravenous contrast. COMPARISON:  Brain MRI 12/17/2017 FINDINGS: ANTERIOR CIRCULATION: --Intracranial internal carotid arteries: Normal. --Anterior cerebral arteries: Normal. Both A1 segments are present. Patent anterior communicating artery. --Middle cerebral arteries: Normal. --Posterior communicating arteries: Present bilaterally. POSTERIOR CIRCULATION: --Basilar artery: Normal. --Posterior cerebral arteries: Normal. --Superior cerebellar arteries: Normal. --Inferior cerebellar arteries: Normal anterior and posterior inferior cerebellar arteries. IMPRESSION: Normal intracranial MRA. Electronically Signed   By: Deatra Robinson M.D.   On: 12/17/2017 22:09   Mr Brain Wo Contrast  Result Date: 12/17/2017 CLINICAL DATA:  Acute onset of confusion. Speech difficulty 1 week ago. EXAM: MRI HEAD WITHOUT CONTRAST TECHNIQUE: Multiplanar, multiecho pulse sequences of the brain and surrounding structures were obtained without intravenous contrast. COMPARISON:  CT head 12/12/2017 FINDINGS: Brain: Acute infarct in the right posterior temporal/parietal white matter. This corresponds to the area of hypodensity on prior CT. Adjacent but separate area of chronic infarction in the right posterior temporal cortex. Small area of chronic hemorrhage in the right occipital lobe which is remote from the above infarct. Mild chronic microvascular ischemic changes in the white matter. Negative for hydrocephalus. Negative for mass lesion. Small chronic infarct central pons. Vascular: Normal arterial flow voids Skull and upper cervical spine: Negative Sinuses/Orbits: Negative Other: None IMPRESSION: Acute infarct right posterior temporal/parietal  white matter. This is a subacute infarct that was present on prior CT 5 days ago. Chronic infarct right posterior temporal cortex. Small area of chronic hemorrhage in the right occipital lobe. Electronically Signed   By: Marlan Palau M.D.   On: 12/17/2017 07:15    RUE:AVWUJ bradycardia (personally reviewed)  TELEMETRY: sinus rhythm/sinus brady (personally reviewed)  Assessment/Plan: 1.  Typical atrial flutter The patient has documented typical atrial flutter. In the past, he was reluctant to use anticoagulation because of recurrent kidney stones requiring procedures for which he needed to hold anticoagulation. He is now willing to take daily anticoagulation.  CHADS2VASC is 5 - creatinine is 1.79, weight >60kg, age <17; ok to use Eliquis 5mg  twice daily I also think we should re-visit catheter ablation for definitive therapy. Now with embolic CVA, would need ILR to look for AF post ablation to determine need for long term anticoagulation. Will arrange outpatient follow up to discuss further.  2.  OSA Compliant with CPAP  3.  HTN Stable No change required today  I have arranged for 10 day follow up with [facility] pharmacists after Eliquis start. Also scheduled follow up with Dr C in a few weeks.     [facility] will sign off.   Medication Recommendations:  Start Eliquis 5mg  twice daily Other recommendations (labs, testing, etc):  BMET, CBC at follow up Follow up as an outpatient:  As scheduled (entered in AVS)  For questions or updates, please contact [facility] Please consult [email or web address] for contact info under Cardiology/STEMI.  Signed, Gypsy Balsam, NP 12/18/2017 2:31 PM   Patient seen and examined.  History of atrial flutter now with interval stroke.  Agreeable to anticoagulation. Is concerned about the side effects of anticoagulation and have reviewed risks and benefits compared to a) no therapy b) low-dose aspirin  based on [place].  He is willing to take  anticoagulation.  Agree with recommendation as outlined above, that is to pursue catheter ablation of atrial flutter.  Issues of post ablation anticoagulation controversial will be deferred to Dr. Fawn Kirk

## 2017-12-18 NOTE — Progress Notes (Addendum)
STROKE TEAM PROGRESS NOTE  HPI: ( Dr Wilford Corner) Darrell Perkins is a 67 y.o. male was a past medical history of paroxysmal atrial fibrillation, for which she is only on Plavix, congestive heart failure, hypertension, sleep apnea, who to the best of my history taking with him and his son was in his usual state of health about 2 to 3 weeks ago when he noticed some headache that went down his neck followed by inability to focus appropriately on things which made him think that he had had a stroke.  He was seen by his outpatient primary care physician and was given antibiotics for sinusitis with the presentation that he had with the primary care.  He did not have any focal tingling numbness or weakness but the primary care had a CT scan of the head done that showed a possible subacute stroke, for which an outpatient work-up including an echocardiogram was initiated. He says that he continues to have those symptoms and yesterday he started having the headache on the back of his head and neck and the fact that he has had a recent stroke with no work-up made him very concerned and brought him back to the ER.  He has been back to the ER yesterday with chest pain as well and was discharged home.  Denies any ongoing chest pain nausea vomiting shortness of breath.  Reports some degree of confusion which mainly is in ability to focus and some short-term memory loss that has been ongoing for a while.   LKW: At least 2 weeks ago tpa given?: no, outside the window Premorbid modified Rankin scale (mRS):1 INTERVAL HISTORY His wife and daughter are at the bedside.  He remains upset that he had stroke symptoms 1-2 weeks ago without workup. Dr. [name] discussed stroke, stroke symptoms and typical plan of care.   Followed by Croitoru, who referred to Allred, last seen by EP 11/2016. Pt typical atrial flutter. Had considered albation, but spontaneously converted back to NSR. Pt hesitant to take blood thinners d/t kidney stones. He  is now agreeable but would like cardiology input.   Vitals:   12/18/17 0110 12/18/17 0318 12/18/17 0758 12/18/17 0922  BP: (!) 130/55 134/72 140/83   Pulse: (!) 47 (!) 54 (!) 49 (!) 51  Resp: 18 16 20 18   Temp: (!) 97.4 F (36.3 C) 98.3 F (36.8 C) 98.6 F (37 C)   TempSrc: Oral Oral Oral   SpO2: 100% 99% 100% 98%    CBC:  Recent Labs  Lab 12/17/17 0229 12/17/17 0238 12/18/17 0339  WBC 3.4*  --  3.4*  NEUTROABS 1.8  --   --   HGB 12.4* 12.9* 11.5*  HCT 39.3 38.0* 34.9*  MCV 88.7  --  87.0  PLT 294  --  230    Basic Metabolic Panel:  Recent Labs  Lab 12/17/17 0229 12/17/17 0238 12/18/17 0339  NA 134* 135 135  K 3.9 4.0 4.1  CL 99 100 103  CO2 24  --  24  GLUCOSE 117* 114* 114*  BUN 24* 29* 21  CREATININE 2.09* 2.30* 1.79*  CALCIUM 9.3  --  9.1   Lipid Panel:     Component Value Date/Time   CHOL 181 12/18/2017 0339   TRIG 119 12/18/2017 0339   HDL 30 (L) 12/18/2017 0339   CHOLHDL 6.0 12/18/2017 0339   VLDL 24 12/18/2017 0339   LDLCALC 127 (H) 12/18/2017 0339   HgbA1c:  Lab Results  Component Value Date  HGBA1C 6.6 (H) 12/18/2017   Urine Drug Screen: No results found for: LABOPIA, COCAINSCRNUR, LABBENZ, AMPHETMU, THCU, LABBARB  Alcohol Level No results found for: [facility]  IMAGING Darrell Maxine Glenn Head Wo Contrast  Result Date: 12/17/2017 CLINICAL DATA:  Stroke follow-up EXAM: MRA HEAD WITHOUT CONTRAST TECHNIQUE: Angiographic images of the Circle of Willis were obtained using MRA technique without intravenous contrast. COMPARISON:  Brain MRI 12/17/2017 FINDINGS: ANTERIOR CIRCULATION: --Intracranial internal carotid arteries: Normal. --Anterior cerebral arteries: Normal. Both A1 segments are present. Patent anterior communicating artery. --Middle cerebral arteries: Normal. --Posterior communicating arteries: Present bilaterally. POSTERIOR CIRCULATION: --Basilar artery: Normal. --Posterior cerebral arteries: Normal. --Superior cerebellar arteries: Normal. --Inferior  cerebellar arteries: Normal anterior and posterior inferior cerebellar arteries. IMPRESSION: Normal intracranial MRA. Electronically Signed   By: Deatra Robinson M.D.   On: 12/17/2017 22:09   Darrell Brain Wo Contrast  Result Date: 12/17/2017 CLINICAL DATA:  Acute onset of confusion. Speech difficulty 1 week ago. EXAM: MRI HEAD WITHOUT CONTRAST TECHNIQUE: Multiplanar, multiecho pulse sequences of the brain and surrounding structures were obtained without intravenous contrast. COMPARISON:  CT head 12/12/2017 FINDINGS: Brain: Acute infarct in the right posterior temporal/parietal white matter. This corresponds to the area of hypodensity on prior CT. Adjacent but separate area of chronic infarction in the right posterior temporal cortex. Small area of chronic hemorrhage in the right occipital lobe which is remote from the above infarct. Mild chronic microvascular ischemic changes in the white matter. Negative for hydrocephalus. Negative for mass lesion. Small chronic infarct central pons. Vascular: Normal arterial flow voids Skull and upper cervical spine: Negative Sinuses/Orbits: Negative Other: None IMPRESSION: Acute infarct right posterior temporal/parietal white matter. This is a subacute infarct that was present on prior CT 5 days ago. Chronic infarct right posterior temporal cortex. Small area of chronic hemorrhage in the right occipital lobe. Electronically Signed   By: Marlan Palau M.D.   On: 12/17/2017 07:15   2D Echocardiogram  - Left ventricle: The cavity size was normal. Wall thickness was increased in a pattern of mild LVH. Systolic function was normal. The estimated ejection fraction was in the range of 55% to 60%. Wall motion was normal; there were no regional wall motion abnormalities. Doppler parameters are consistent with abnormal left ventricular relaxation (grade 1 diastolic dysfunction). Impressions:  Normal LV systolic function; mild LVH; mild diastolic dysfunction.  Carotid Doppler   There  is 1-39% bilateral ICA stenosis. Vertebral artery flow is antegrade.   PHYSICAL EXAM  pleasant middle-age male currently not in distress. . Afebrile. Head is nontraumatic. Neck is supple without bruit.    Cardiac exam no murmur or gallop. Lungs are clear to auscultation. Distal pulses are well felt.  Neurological Exam ;  Awake  Alert oriented x 3. Normal speech and language.eye movements full without nystagmus.fundi were not visualized. Vision acuity and fields appear normal. Hearing is normal. Palatal movements are normal. Face symmetric. Tongue midline. Normal strength, tone, reflexes and coordination. Normal sensation. Gait deferred.   ASSESSMENT/PLAN Darrell. MAYSON Perkins is a 67 y.o. male with history of atrial flutter not on AC, CHF, HTN, OSA dx with subacute stroke 2-3 weeks ago following a HA, now presenting with continued HA, short-term memory loss.   Stroke:  right temporal white matter infarct likely due to small vessel disease but cannot rule out embolic source given known atrial flutter not on anticoagulation   CT head R temporal, occipital and parietal jxn infarct  MRI  R posterior temporal/parietal white matter infarct  MRA  normal  Carotid Doppler  B ICA 1-39% stenosis, VAs antegrade   2D Echo  EF 555-60%. No source of embolus   LDL 127  HgbA1c 6.6  SCDs for VTE prophylaxis  clopidogrel 75 mg daily prior to admission, now on clopidogrel 75 mg daily. Intolerant to aspirin in past (stomach ache) recommend stopping plavix and starting Eliquis for secondary stroke prevention.   Therapy recommendations:  No therapy needs  Disposition:  Return home  OK for d/c once cardiology sees and DOAC started Follow-up Stroke Clinic at [facility] in 4 weeks. Office will call with appointment date and time. Order placed.  Atrial Flutter  Home anticoagulation:  none   CHA2DS2-VASc Score = at least 5, ?2 oral anticoagulation recommended  Age in Years:  23-74   +1     Sex:  Male   0   Hypertension History:  yes   +1     Diabetes Mellitus:  0  Congestive Heart Failure History:  yes   +1  Vascular Disease History:  0     Stroke/TIA/Thromboembolism History:  yes   +2 . Recommend starting Eliquis (apixaban) daily at discharge . Have contacted EP to see pt prior to starting Eliquis. They will see today   Hypertension  Stable . BP goal normotensive  Hyperlipidemia  Home meds:  zetia 10, resumed in hospital.   Intolerant to statins in the past  LDL 127, goal < 70  Continue zetia at discharge  Other Stroke Risk Factors  Advanced age  Family hx stroke (father)  Obstructive sleep apnea, on CPAP at home  Congestive heart failure - grade I DD  Other Active Problems  CKD stage 3  Hospital day # 1  Annie Main, MSN, APRN, ANVP-BC, AGPCNP-BC Advanced Practice Stroke Nurse [facility] See Amion for Schedule & Pager information 12/18/2017 12:50 PM  I have personally examined this patient, reviewed notes, independently viewed imaging studies, participated in medical decision making and plan of care.ROS completed by me personally and pertinent positives fully documented  I have made any additions or clarifications directly to the above note. Agree with note above. He presented with subacute symptoms of headache and MRI scan shows a tiny subacute right temporoparietal white matter infarct but also shows old infarcts of remote age and given history of atrial flutter cardioembolic etiology seems most likely. Agree with long-term anticoagulation with eliquis if patient is willing. Had a long discussion with the patient and wife and discussed risk benefits of anticoagulation and stroke prevention and they seem agreeable. Recommend Cardiolite consult to rediscuss atrial flutter ablation if patient is willing to discuss with Dr. Rhona Leavens. Greater than 50% time during this 25 minute visit was spent on counseling and coordination of care about his  stroke, atrial flutter and discussion with care team  Delia Heady, MD Medical Director [facility] Pager: [phone] 12/18/2017 3:12 PM  To contact Stroke Continuity provider, please refer to [email or web address]. After hours, contact General Neurology

## 2017-12-18 NOTE — Progress Notes (Signed)
PT Cancellation Note  Patient Details Name: Darrell Perkins MRN: [id number] DOB: 11-07-1950   Cancelled Treatment:    Reason Eval/Treat Not Completed: New PT eval received, chart reviewed. PT screened 10/13 and note that pt was independent with mobility during OT evaluation on 10/13 as well. Will sign off.    Marylynn Pearson 12/18/2017, 7:10 AM   Conni Slipper, PT, DPT Acute Rehabilitation Services Pager: [phone] Office: [phone]

## 2017-12-18 NOTE — Discharge Instructions (Signed)

## 2017-12-18 NOTE — Discharge Summary (Signed)
Physician Discharge Summary  JOWEL WALTNER BMW:[id number] DOB: Sep 01, 1950 DOA: 12/17/2017  PCP: [name], MD  Admit date: 12/17/2017 Discharge date: 12/18/2017  Admitted From: Home Disposition:  Home  Recommendations for Outpatient Follow-up:  1. Follow up with PCP in 1-2 weeks  Discharge Condition:Stable CODE STATUS:Full Diet recommendation: heart healthy   Brief/Interim Summary: 67 year old African-American male, with past medical history significant for atrial fibrillation/atrial flutter, OSA, pneumonia, hypertension, GERD, congestive heart failure, degenerative disease of the knee and mild diastolic dysfunction.  According to the patient, his problem started 2 weeks ago with "light stroke".  Patient reported having developed headache and pain involving the back of the neck and had difficulty with focusing.  Patient was seen by the primary care provider, and was given antibiotics without any significant improvement.  Patient went back to the primary care provider for the same problem and had a CT scan of the head.  According to the patient, the CT of the head showed "mild stroke".  However, CT scan of the head without contrast done on 12/12/2017 and documented in our system revealed "The ventricles are normal in size and configuration. There is no intracranial mass, hemorrhage, extra-axial fluid collection, or midline shift.  There is evidence of a prior infarct at the junction of the right posterior temporal, lateral occipital, and inferior parietal lobes.  There is prior focal infarct in the adjacent centrum semiovale posteriorly on the right. Elsewhere there is patchy small vessel disease in the centra semiovale bilaterally. No acute appearing infarct is evident. Brain parenchyma elsewhere appears unremarkable".  According to the patient, confusion and difficulty concentrating persisted after the CT scan.  Patient was seen at the emergency room 2 days ago and was discharged home.  Patient  presented again last night with difficulty concentrating, confusion, not feeling right and fluttering of the heart.  MRI of the brain done on presentation revealed "Acute infarct right posterior temporal/parietal white matter. This is a subacute infarct that was present on prior CT 5 days ago.  Chronic infarct right posterior temporal cortex. Small area of chronic hemorrhage in the right occipital lobe".  Hospitalist team has been asked to admit patient for further assessment and management.  No fever or chills, none cough, no chest pain, no URI symptoms, no GI symptoms, no urinary symptoms.  Patient denied speech problems.  Patient also denied focal motor weakness, however, left lower extremity is noted to be weaker than the right, the patient attributes this to the degenerative joint disease.  ED Course: Kindly see above. Pertinent labs: Kindly see imaging studies above.  Chemistry reveals sodium of 134, potassium of 3.9, chloride 99, CO2 of 24, BUN of 24 and creatinine of 2.09.  Blood sugar is 117.  Cardiac enzymes and negative.  CBC reveals WBC of 3.4, hemoglobin of 12.4, hematocrit of 39.3, MCV of 88.7 with platelet count of 294  Acute CVA: -Neurology input is appreciated. -Acute R post temporal/parietal CVA noted on MRI brain -Plavix initially started -B carotid dopplers neg for occlusion -Discussed witih Cardiology who states OK to transition to Eliquis in setting of afib -Neuro has since signed off with recs for close outpatient follow up  Paroxysmal atrial fibrillation: -Rate controlled. -anticoagulation per above  OSA: -CPAP  Hypertension: -Initially allowed Permissive Hypertension. -Resume meds on discharge  Grade 1 diastolic dysfunction: -Stable at this time -No symptoms.  CKD 3: -Stable.Marland Kitchen   Discharge Diagnoses:  Active Problems:   Acute CVA (cerebrovascular accident) [facility])    Discharge Instructions  Discharge Instructions    Ambulatory referral to  Neurology   Complete by:  As directed    Follow up with stroke clinic NP (Jessica Vanschaick or Darrol Angel, if both not available, consider Dr. Delia Heady, Dr. Jamelle Rushing, or Dr. Naomie Dean) at [facility] in about 4 weeks.     Allergies as of 12/18/2017      Reactions   Allopurinol Other (See Comments)   "Upset stomach"   Aspirin Other (See Comments)   Upset stomach   Prednisone Other (See Comments)   "Irritable"   Rosuvastatin Other (See Comments)   Mental disturbance   Simvastatin Other (See Comments)   Tired and achy   Tuberculin Tests Other (See Comments)   Intense local reaction      Medication List    STOP taking these medications   amoxicillin-clavulanate 875-125 MG tablet Commonly known as:  AUGMENTIN   clopidogrel 75 MG tablet Commonly known as:  PLAVIX     TAKE these medications   acetaminophen 650 MG CR tablet Commonly known as:  TYLENOL Take 650 mg by mouth 2 (two) times daily as needed for pain.   albuterol 108 (90 Base) MCG/ACT inhaler Commonly known as:  PROVENTIL HFA;VENTOLIN HFA Inhale 1-2 puffs into the lungs every 6 (six) hours as needed for wheezing.   apixaban 5 MG Tabs tablet Commonly known as:  ELIQUIS Take 1 tablet (5 mg total) by mouth every 12 (twelve) hours. Additional refills by PCP, Neurology, or Cardiology   Bee Pollen 580 MG Caps Take 1,160 mg by mouth daily.   ezetimibe 10 MG tablet Commonly known as:  ZETIA Take 10 mg by mouth daily.   fluticasone 50 MCG/ACT nasal spray Commonly known as:  FLONASE Place 1 spray into both nostrils daily.   hydrochlorothiazide 12.5 MG tablet Commonly known as:  HYDRODIURIL Take 12.5 mg by mouth daily.   losartan 100 MG tablet Commonly known as:  COZAAR Take 100 mg by mouth daily.   meloxicam 15 MG tablet Commonly known as:  MOBIC Take 15 mg by mouth daily as needed for pain.   polyethylene glycol packet Commonly known as:  MIRALAX / GLYCOLAX Take 17 g  by mouth daily as needed for mild constipation.   SYMBICORT 160-4.5 MCG/ACT inhaler Generic drug:  budesonide-formoterol Inhale 2 puffs into the lungs every other day.   Vitamin D 2000 units tablet Take 2,000 Units by mouth daily.      Follow-up Information    [facility] Follow up in 4 week(s).   Specialty:  Neurology Why:  stroke clinic. office will call with appt date and time. Contact information: [address] [place] [address] [phone]       [facility] Office Follow up on 12/28/2017.   Specialty:  Cardiology Why:  at 10:30AM for Eliquis follow up Contact information: [address] [place] [address] [phone]         Allergies  Allergen Reactions  . Allopurinol Other (See Comments)    "Upset stomach"  . Aspirin Other (See Comments)    Upset stomach  . Prednisone Other (See Comments)    "Irritable"  . Rosuvastatin Other (See Comments)    Mental disturbance  . Simvastatin Other (See Comments)    Tired and achy  . Tuberculin Tests Other (See Comments)    Intense local reaction    Consultations:  Neurology  Cardiology  Procedures/Studies: Dg Chest 2 View  Result Date: 12/16/2017 CLINICAL DATA:  Shortness of breath EXAM: CHEST - 2 VIEW COMPARISON:  11/02/2016 FINDINGS: The heart size and mediastinal contours are within normal limits. Both lungs are clear. The visualized skeletal structures are unremarkable. IMPRESSION: No active cardiopulmonary disease. Electronically Signed   By: Alcide Clever M.D.   On: 12/16/2017 09:09   Ct Head Wo Contrast  Result Date: 12/13/2017 CLINICAL DATA:  Loss of focus and concentration with altered mental status. Headache. EXAM: CT HEAD WITHOUT CONTRAST TECHNIQUE: Contiguous axial images were obtained from the base of the skull through the vertex without intravenous contrast. COMPARISON:  None. FINDINGS: Brain: The ventricles  are normal in size and configuration. There is no intracranial mass, hemorrhage, extra-axial fluid collection, or midline shift. There is evidence of a prior infarct at the junction of the right posterior temporal, lateral occipital, and inferior parietal lobes. There is prior focal infarct in the adjacent centrum semiovale posteriorly on the right. Elsewhere there is patchy small vessel disease in the centra semiovale bilaterally. No acute appearing infarct is evident. Brain parenchyma elsewhere appears unremarkable. Vascular: No evident hyperdense vessel. There is no appreciable vascular calcification. Skull: Bony calvarium appears intact. Sinuses/Orbits: There is mucosal thickening in several ethmoid air cells. Other paranasal sinuses are clear. Orbits appear symmetric bilaterally. Other: Mastoid air cells are clear. IMPRESSION: Prior focal infarct at the junctions of the right temporal, occipital, and parietal lobes with adjacent prior infarct in the right centrum semiovale posteriorly in this area. Mild patchy periventricular small vessel disease noted elsewhere. No acute infarct evident. No mass or hemorrhage. There is mucosal thickening in several ethmoid air cells. Electronically Signed   By: Bretta Bang III M.D.   On: 12/13/2017 09:57   Mr Maxine Glenn Head Wo Contrast  Result Date: 12/17/2017 CLINICAL DATA:  Stroke follow-up EXAM: MRA HEAD WITHOUT CONTRAST TECHNIQUE: Angiographic images of the Circle of Willis were obtained using MRA technique without intravenous contrast. COMPARISON:  Brain MRI 12/17/2017 FINDINGS: ANTERIOR CIRCULATION: --Intracranial internal carotid arteries: Normal. --Anterior cerebral arteries: Normal. Both A1 segments are present. Patent anterior communicating artery. --Middle cerebral arteries: Normal. --Posterior communicating arteries: Present bilaterally. POSTERIOR CIRCULATION: --Basilar artery: Normal. --Posterior cerebral arteries: Normal. --Superior cerebellar arteries:  Normal. --Inferior cerebellar arteries: Normal anterior and posterior inferior cerebellar arteries. IMPRESSION: Normal intracranial MRA. Electronically Signed   By: Deatra Robinson M.D.   On: 12/17/2017 22:09   Mr Brain Wo Contrast  Result Date: 12/17/2017 CLINICAL DATA:  Acute onset of confusion. Speech difficulty 1 week ago. EXAM: MRI HEAD WITHOUT CONTRAST TECHNIQUE: Multiplanar, multiecho pulse sequences of the brain and surrounding structures were obtained without intravenous contrast. COMPARISON:  CT head 12/12/2017 FINDINGS: Brain: Acute infarct in the right posterior temporal/parietal white matter. This corresponds to the area of hypodensity on prior CT. Adjacent but separate area of chronic infarction in the right posterior temporal cortex. Small area of chronic hemorrhage in the right occipital lobe which is remote from the above infarct. Mild chronic microvascular ischemic changes in the white matter. Negative for hydrocephalus. Negative for mass lesion. Small chronic infarct central pons. Vascular: Normal arterial flow voids Skull and upper cervical spine: Negative Sinuses/Orbits: Negative Other: None IMPRESSION: Acute infarct right posterior temporal/parietal white matter. This is a subacute infarct that was present on prior CT 5 days ago. Chronic infarct right posterior temporal cortex. Small area of chronic hemorrhage in the right occipital lobe. Electronically Signed   By: Marlan Palau M.D.   On: 12/17/2017 07:15  Subjective: Eager to go home  Discharge Exam: Vitals:   12/18/17 0922 12/18/17 1239  BP:  (!) 143/75  Pulse: (!) 51 63  Resp: 18 20  Temp:  98.3 F (36.8 C)  SpO2: 98% 100%   Vitals:   12/18/17 0318 12/18/17 0758 12/18/17 0922 12/18/17 1239  BP: 134/72 140/83  (!) 143/75  Pulse: (!) 54 (!) 49 (!) 51 63  Resp: 16 20 18 20   Temp: 98.3 F (36.8 C) 98.6 F (37 C)  98.3 F (36.8 C)  TempSrc: Oral Oral  Oral  SpO2: 99% 100% 98% 100%    General: Pt is alert,  awake, not in acute distress Cardiovascular: RRR, S1/S2 +, no rubs, no gallops Respiratory: CTA bilaterally, no wheezing, no rhonchi Abdominal: Soft, NT, ND, bowel sounds + Extremities: no edema, no cyanosis   The results of significant diagnostics from this hospitalization (including imaging, microbiology, ancillary and laboratory) are listed below for reference.     Microbiology: No results found for this or any previous visit (from the past 240 hour(s)).   Labs: BNP (last 3 results) No results for input(s): BNP in the last 8760 hours. Basic Metabolic Panel: Recent Labs  Lab 12/16/17 0815 12/17/17 0229 12/17/17 0238 12/18/17 0339  NA 132* 134* 135 135  K 4.0 3.9 4.0 4.1  CL 100 99 100 103  CO2 23 24  --  24  GLUCOSE 116* 117* 114* 114*  BUN 27* 24* 29* 21  CREATININE 2.06* 2.09* 2.30* 1.79*  CALCIUM 9.1 9.3  --  9.1   Liver Function Tests: Recent Labs  Lab 12/17/17 0229  AST 19  ALT 17  ALKPHOS 49  BILITOT 0.7  PROT 7.6  ALBUMIN 3.8   No results for input(s): LIPASE, AMYLASE in the last 168 hours. No results for input(s): AMMONIA in the last 168 hours. CBC: Recent Labs  Lab 12/16/17 0815 12/17/17 0229 12/17/17 0238 12/18/17 0339  WBC 2.8* 3.4*  --  3.4*  NEUTROABS  --  1.8  --   --   HGB 12.5* 12.4* 12.9* 11.5*  HCT 39.5 39.3 38.0* 34.9*  MCV 90.6 88.7  --  87.0  PLT 260 294  --  230   Cardiac Enzymes: No results for input(s): CKTOTAL, CKMB, CKMBINDEX, TROPONINI in the last 168 hours. BNP: Invalid input(s): POCBNP CBG: Recent Labs  Lab 12/17/17 0426  GLUCAP 98   D-Dimer No results for input(s): DDIMER in the last 72 hours. Hgb A1c Recent Labs    12/17/17 0229 12/18/17 0339  HGBA1C 6.7* 6.6*   Lipid Profile Recent Labs    12/17/17 0229 12/18/17 0339  CHOL 211* 181  HDL 35* 30*  LDLCALC 149* 191*  TRIG 133 119  CHOLHDL 6.0 6.0   Thyroid function studies No results for input(s): TSH, T4TOTAL, T3FREE, THYROIDAB in the last 72  hours.  Invalid input(s): FREET3 Anemia work up No results for input(s): VITAMINB12, FOLATE, FERRITIN, TIBC, IRON, RETICCTPCT in the last 72 hours. Urinalysis    Component Value Date/Time   COLORURINE STRAW (A) 12/18/2017 0150   APPEARANCEUR CLEAR 12/18/2017 0150   LABSPEC 1.009 12/18/2017 0150   PHURINE 5.0 12/18/2017 0150   GLUCOSEU NEGATIVE 12/18/2017 0150   HGBUR MODERATE (A) 12/18/2017 0150   BILIRUBINUR NEGATIVE 12/18/2017 0150   KETONESUR NEGATIVE 12/18/2017 0150   PROTEINUR NEGATIVE 12/18/2017 0150   UROBILINOGEN 0.2 11/12/2016 1249   NITRITE NEGATIVE 12/18/2017 0150   LEUKOCYTESUR NEGATIVE 12/18/2017 0150   Sepsis Labs Invalid input(s): PROCALCITONIN,  WBC,  LACTICIDVEN Microbiology No results found for this or any previous visit (from the past 240 hour(s)).  Time spent: 30 min  SIGNED:   Rickey Barbara, MD  [facility] 12/18/2017, 2:58 PM  If 7PM-7AM, please contact night-coverage

## 2017-12-18 NOTE — Evaluation (Signed)
Clinical/Bedside Swallow Evaluation Patient Details  Name: Darrell Perkins MRN: [id number] Date of Birth: 25-Dec-1950  Today's Date: 12/18/2017 Time: SLP Start Time (ACUTE ONLY): 1030 SLP Stop Time (ACUTE ONLY): 1050 SLP Time Calculation (min) (ACUTE ONLY): 20 min  Past Medical History:  Past Medical History:  Diagnosis Date  . Bronchitis    hx of   . CHF (congestive heart failure) (HCC)    EF 45%  . 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 GERD (gastroesophageal reflux disease)   . Hypertensive cardiovascular disease   . Kidney stones   . Pneumonia    hx of   . Rapid atrial fibrillation (HCC) 11/26/2014  . Sleep apnea    USES CPAP  . Typical atrial flutter (HCC) 10/2016   Past Surgical History:  Past Surgical History:  Procedure Laterality Date  . colonscopy     . CYSTOSCOPY WITH RETROGRADE PYELOGRAM, URETEROSCOPY AND STENT PLACEMENT Right 08/01/2014   Procedure: CYSTOSCOPY WITH RIGHT  RETROGRADE PYELOGRAM/RIGHT URETEROSCOPY HOLMIUMN LASER LITOTRIPSY  STENT PLACEMENT ;  Surgeon: Jerilee Field, MD;  Location: [facility];  Service: Urology;  Laterality: Right;  . URETEROSCOPY WITH HOLMIUM LASER LITHOTRIPSY Right 11/18/2016   Procedure: URETEROSCOPY WITH HOLMIUM LASER LITHOTRIPSY/STENT PLACEMENT;  Surgeon: Jerilee Field, MD;  Location: [facility];  Service: Urology;  Laterality: Right;   HPI:  67 year old man with a history of atrial fibrillation, congestive heart failure and hypertension with at least 1 to 2-week history of nonspecific symptoms including headaches, inability to focus and possible confusion with a subacute stroke on MRI.   Assessment / Plan / Recommendation Clinical Impression  Pt demonstrates moderate deficits in working and short term memory. He is notably disoriented to to time and repeatedly unsuccessful in tasks on the Cognistat that required working or short term memory such as number sequence repetition, four word memory task, repeating complex sentences,  and completing mathematic reasoning without visual cues. Pt likely to benefit from a course of outpatient interventions to target memory and compensatory strategies. He also reports confusion with rules of driving and is aware he should not drive at this time. Recommend f/u with neuropsych and/or SLP. Discussed with pt and MD.  SLP Visit Diagnosis: Frontal lobe and executive function deficit Frontal lobe and executive function deficit following: Cerebral infarction    Aspiration Risk       Diet Recommendation          Other  Recommendations     Follow up Recommendations Outpatient SLP;24 hour supervision/assistance      Frequency and Duration min 2x/week          Prognosis        Swallow Study   General HPI: 67 year old man with a history of atrial fibrillation, congestive heart failure and hypertension with at least 1 to 2-week history of nonspecific symptoms including headaches, inability to focus and possible confusion with a subacute stroke on MRI.    Oral/Motor/Sensory Function Overall Oral Motor/Sensory Function: Within functional limits   Ice Chips     Thin Liquid      Nectar Thick     Honey Thick     Puree     Solid           Harlon Ditty, MA CCC-SLP  Acute Rehabilitation Services Pager [phone] Office [phone]  Dyanne Iha, Riley Nearing 12/18/2017,1:27 PM

## 2017-12-18 NOTE — Care Management Note (Signed)
Case Management Note  Patient Details  Name: Darrell Perkins MRN: [id number] Date of Birth: [birth year]  Subjective/Objective:     Patient admitted with a stroke. He is from home with his spouse. Pt denies any DME for him at home but his wife does have some equipment.  Pt denies any issues with medications. Pt states his son can provide transportation as needed.                Action/Plan: Pt discharging home with self care. PT/OT with no f/u and no DME needs.  Pt d/cing with Eliquis. CM provided him 30 day free card. Pt to f/u with his pharmacy on the cost after the first 30 days.  Pt states his son will provide transportation home.   Expected Discharge Date:  12/18/17               Expected Discharge Plan:  Home/Self Care  In-House Referral:     Discharge planning Services  CM Consult, Medication Assistance(Eliquis)  Post Acute Care Choice:    Choice offered to:     DME Arranged:    DME Agency:     HH Arranged:    HH Agency:     Status of Service:  Completed, signed off  If discussed at [employer] of Stay Meetings, dates discussed:    Additional Comments:  Kermit Balo, RN 12/18/2017, 4:27 PM

## 2017-12-19 ENCOUNTER — Encounter (HOSPITAL_COMMUNITY): Payer: Self-pay | Admitting: Emergency Medicine

## 2017-12-19 ENCOUNTER — Other Ambulatory Visit: Payer: Self-pay

## 2017-12-19 ENCOUNTER — Emergency Department (HOSPITAL_COMMUNITY)
Admission: EM | Admit: 2017-12-19 | Discharge: 2017-12-20 | Disposition: A | Discharge status: Home / Self Care | Payer: Medicare HMO | Attending: Emergency Medicine | Admitting: Emergency Medicine

## 2017-12-19 DIAGNOSIS — N183 Chronic kidney disease, stage 3 (moderate): Secondary | ICD-10-CM | POA: Diagnosis not present

## 2017-12-19 DIAGNOSIS — Z7901 Long term (current) use of anticoagulants: Secondary | ICD-10-CM | POA: Insufficient documentation

## 2017-12-19 DIAGNOSIS — R51 Headache: Secondary | ICD-10-CM | POA: Insufficient documentation

## 2017-12-19 DIAGNOSIS — Z79899 Other long term (current) drug therapy: Secondary | ICD-10-CM | POA: Diagnosis not present

## 2017-12-19 DIAGNOSIS — Z8673 Personal history of transient ischemic attack (TIA), and cerebral infarction without residual deficits: Secondary | ICD-10-CM | POA: Insufficient documentation

## 2017-12-19 DIAGNOSIS — R4589 Other symptoms and signs involving emotional state: Secondary | ICD-10-CM | POA: Diagnosis not present

## 2017-12-19 DIAGNOSIS — I130 Hypertensive heart and chronic kidney disease with heart failure and stage 1 through stage 4 chronic kidney disease, or unspecified chronic kidney disease: Secondary | ICD-10-CM | POA: Insufficient documentation

## 2017-12-19 DIAGNOSIS — I509 Heart failure, unspecified: Secondary | ICD-10-CM | POA: Diagnosis not present

## 2017-12-19 DIAGNOSIS — R519 Headache, unspecified: Secondary | ICD-10-CM

## 2017-12-19 DIAGNOSIS — F39 Unspecified mood [affective] disorder: Secondary | ICD-10-CM | POA: Diagnosis not present

## 2017-12-19 DIAGNOSIS — J45909 Unspecified asthma, uncomplicated: Secondary | ICD-10-CM | POA: Insufficient documentation

## 2017-12-19 LAB — CBC WITH DIFFERENTIAL/PLATELET
Abs Immature Granulocytes: 0.02 10*3/uL (ref 0.00–0.07)
BASOS ABS: 0 10*3/uL (ref 0.0–0.1)
BASOS PCT: 1 %
Eosinophils Absolute: 0 10*3/uL (ref 0.0–0.5)
Eosinophils Relative: 0 %
HCT: 39.3 % (ref 39.0–52.0)
Hemoglobin: 12.7 g/dL — ABNORMAL LOW (ref 13.0–17.0)
IMMATURE GRANULOCYTES: 1 %
Lymphocytes Relative: 29 %
Lymphs Abs: 1.2 10*3/uL (ref 0.7–4.0)
MCH: 28.7 pg (ref 26.0–34.0)
MCHC: 32.3 g/dL (ref 30.0–36.0)
MCV: 88.7 fL (ref 80.0–100.0)
Monocytes Absolute: 0.5 10*3/uL (ref 0.1–1.0)
Monocytes Relative: 12 %
NEUTROS PCT: 57 %
NRBC: 0 % (ref 0.0–0.2)
Neutro Abs: 2.4 10*3/uL (ref 1.7–7.7)
PLATELETS: 286 10*3/uL (ref 150–400)
RBC: 4.43 MIL/uL (ref 4.22–5.81)
RDW: 12.3 % (ref 11.5–15.5)
WBC: 4.1 10*3/uL (ref 4.0–10.5)

## 2017-12-19 LAB — COMPREHENSIVE METABOLIC PANEL
ALBUMIN: 3.9 g/dL (ref 3.5–5.0)
ALT: 16 U/L (ref 0–44)
AST: 18 U/L (ref 15–41)
Alkaline Phosphatase: 45 U/L (ref 38–126)
Anion gap: 8 (ref 5–15)
BUN: 22 mg/dL (ref 8–23)
CHLORIDE: 101 mmol/L (ref 98–111)
CO2: 25 mmol/L (ref 22–32)
Calcium: 9.6 mg/dL (ref 8.9–10.3)
Creatinine, Ser: 2.07 mg/dL — ABNORMAL HIGH (ref 0.61–1.24)
GFR calc Af Amer: 36 mL/min — ABNORMAL LOW (ref >60)
GFR, EST NON AFRICAN AMERICAN: 31 mL/min — AB (ref >60)
GLUCOSE: 108 mg/dL — AB (ref 70–99)
POTASSIUM: 4.2 mmol/L (ref 3.5–5.1)
Sodium: 134 mmol/L — ABNORMAL LOW (ref 135–145)
Total Bilirubin: 1 mg/dL (ref 0.3–1.2)
Total Protein: 7.5 g/dL (ref 6.5–8.1)

## 2017-12-19 MED ORDER — HYDROXYZINE HCL 25 MG PO TABS: 25.0000 mg | ORAL_TABLET | Freq: Four times a day (QID) | ORAL | 0 refills | Status: AC

## 2017-12-19 NOTE — ED Provider Notes (Signed)
Patient placed in Quick Look pathway, seen and evaluated   Chief Complaint: head feels not right  HPI:   Pt reports pressure sensation that he describes as "loaded" feeling on top his head. Similar symptoms to recent CVA. States had stroke about 2 week ago, at that time had similar pressure but also had memory loss, blurred vision, confusion which he is not having now. States just got discharged yesterday and states "I feel like they let me get out of the hospital too early." no other numbness or weakness in extremities. No memory issues. No speech problems. States "I just dont feel right."   ROS: pressure to the head.  Physical Exam:   Gen: No distress  Neuro: Awake and Alert  Skin: Warm    Focused Exam: Pupils are equal, round, reactive to light and accommodation.  Extraocular movements are normal.  Cranial nerves are intact.  5 out of 5 and equal upper and lower strength bilaterally.  Normal gait and coordination. Lungs clear bilaterally. Regular HR and rhytm.   Patient in emergency department with pressure to the head that sounds like he has had since his stroke.  He states that he felt worse today.  He only can describe this as "my head is loaded."  He has no other neuro deficits on exam right now.  He is concerned that they may be released him from the hospital to early.  I will check basic labs, urine analysis.  He has no neuro deficits on exam, will hold off on ordering any imaging at this time.  He is in no acute distress otherwise.  Vitals:   12/19/17 1525 12/19/17 1533  BP: (!) 141/68   Pulse: 68   Resp: 16   Temp: 98.5 F (36.9 C)   TempSrc: Oral   SpO2: 100%   Weight:  117 kg  Height:  6' 2.5" (1.892 m)       Initiation of care has begun. The patient has been counseled on the process, plan, and necessity for staying for the completion/evaluation, and the remainder of the medical screening examination    Jaynie Crumble, PA-C 12/19/17 1538    Little, Ambrose Finland, MD 12/20/17 1424

## 2017-12-19 NOTE — ED Provider Notes (Signed)
[facility] EMERGENCY DEPARTMENT Provider Note   CSN: [id number] Arrival date & time: 12/19/17  1449     History   Chief Complaint Chief Complaint  Patient presents with  . Head Feels "Loaded"    HPI Darrell Perkins is a 67 y.o. male with past medical history of atrial fibrillation/flutter, hypertension, GERD, heart failure EF 55-60%, recent admission for CVA discharged yesterday presents for evaluation of discomfort to scalp.  Pt describes it as a "weird feeling", "pressure" that begins at top of scalp and radiates down neck.  Symptoms have been constant since September 28th when he reports he had his first stroke (I cannot find documentation of CVA on this date).  He has had mild, constant, scalp discomfort since the 28th, throughout hospital stay and since discharge yesterday.  He felt it got worse this afternoon when he was sitting in his truck.  Feels better when he rubs his scalp.  Notices the discomfort gets worse when he is concentrating, thinking and when he is anxious.  Pt states recently he has been more "depressed", with sad mood ever since he retired 8 months ago, worsening after his stroke.  He feels more anxious at times.  He is sleeping well. He denies SI, HI, AVH.   No associated fevers, vision changes, nausea, vomiting, light-headedness, presyncope, difficulty walking or talking, numbness or weakness. No head trauma or falls. Has been compliant with medications.  HPI  Past Medical History:  Diagnosis Date  . Bronchitis    hx of   . CHF (congestive heart failure) (HCC)    EF 45%  . 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 GERD (gastroesophageal reflux disease)   . Hypertensive cardiovascular disease   . Kidney stones   . Pneumonia    hx of   . Rapid atrial fibrillation (HCC) 11/26/2014  . Sleep apnea    USES CPAP  . Typical atrial flutter (HCC) 10/2016    Patient Active Problem List   Diagnosis Date Noted  . Acute CVA (cerebrovascular  accident) (HCC) 12/17/2017  . Chronic pain of left knee 03/06/2017  . Unilateral primary osteoarthritis, left knee 03/06/2017  . Leukopenia 11/03/2016  . Atrial fibrillation with RVR (HCC) 11/02/2016  . Allergic rhinitis 11/26/2014  . Cutaneous eruption 11/26/2014  . Uncomplicated asthma 11/26/2014  . Asthma, extrinsic, without status asthmaticus 11/26/2014  . Nephrolithiasis 11/26/2014  . Cellulitis and abscess of leg 11/26/2014  . ED (erectile dysfunction) of organic origin 11/26/2014  . Benign essential HTN 11/26/2014  . Peripheral neuralgia 11/26/2014  . Cephalalgia 11/26/2014  . Tinea cruris 11/26/2014  . Folliculitis 11/26/2014  . Hypercholesterolemia without hypertriglyceridemia 11/26/2014  . Hematuria, microscopic 11/26/2014  . H/O adenomatous polyp of colon 11/26/2014  . Snoring 11/26/2014  . Difficulty staying awake 11/26/2014  . Essential hypertension 11/26/2014  . Combined fat and carbohydrate induced hyperlipemia 11/26/2014  . Tinea pedis 11/26/2014  . Erectile dysfunction due to arterial insufficiency 11/26/2014  . Perifolliculitis capitis abscedens 11/26/2014  . Other constipation 11/26/2014  . Chest pain 11/26/2014  . Other fatigue 11/26/2014  . Pain of right leg 11/26/2014  . Lesion of lateral popliteal nerve 11/26/2014  . Chronic kidney disease, stage III (moderate) (HCC) 11/26/2014  . 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 Athlete's foot 11/26/2014  . Mixed hyperlipidemia 11/26/2014  . Adiposity 11/26/2014  . Polypharmacy 11/26/2014  . 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 Chest pain syndrome 11/26/2014  . Rapid atrial fibrillation (HCC) 11/26/2014  .  OSA on CPAP 11/26/2014  . Encounter for general adult medical examination without abnormal findings 09/02/2014    Past Surgical History:  Procedure Laterality Date  . colonscopy     . CYSTOSCOPY WITH RETROGRADE PYELOGRAM, URETEROSCOPY AND STENT PLACEMENT Right 08/01/2014   Procedure:  CYSTOSCOPY WITH RIGHT  RETROGRADE PYELOGRAM/RIGHT URETEROSCOPY HOLMIUMN LASER LITOTRIPSY  STENT PLACEMENT ;  Surgeon: Jerilee Field, MD;  Location: [facility];  Service: Urology;  Laterality: Right;  . URETEROSCOPY WITH HOLMIUM LASER LITHOTRIPSY Right 11/18/2016   Procedure: URETEROSCOPY WITH HOLMIUM LASER LITHOTRIPSY/STENT PLACEMENT;  Surgeon: Jerilee Field, MD;  Location: [facility];  Service: Urology;  Laterality: Right;        Home Medications    Prior to Admission medications   Medication Sig Start Date End Date Taking? Authorizing Provider  acetaminophen (TYLENOL) 650 MG CR tablet Take 650 mg by mouth 2 (two) times daily as needed for pain.   Yes [provider]  albuterol (PROVENTIL HFA;VENTOLIN HFA) 108 (90 BASE) MCG/ACT inhaler Inhale 1-2 puffs into the lungs every 6 (six) hours as needed for wheezing. 04/14/12  Yes Sunnie Nielsen, MD  apixaban (ELIQUIS) 5 MG TABS tablet Take 1 tablet (5 mg total) by mouth every 12 (twelve) hours. Additional refills by PCP, Neurology, or Cardiology Patient taking differently: Take 5 mg by mouth every 12 (twelve) hours.  12/18/17 01/17/18 Yes Jerald Kief, MD  Bee Pollen 580 MG CAPS Take 1,160 mg by mouth daily.    Yes [provider]  budesonide-formoterol (SYMBICORT) 160-4.5 MCG/ACT inhaler Inhale 2 puffs into the lungs every other day.    Yes [provider]  Cholecalciferol (VITAMIN D) 2000 units tablet Take 2,000 Units by mouth daily.   Yes [provider]  ezetimibe (ZETIA) 10 MG tablet Take 10 mg by mouth daily. 11/25/17  Yes [provider]  fluticasone (FLONASE) 50 MCG/ACT nasal spray Place 1 spray into both nostrils daily. 12/07/17  Yes [provider]  losartan (COZAAR) 100 MG tablet Take 100 mg by mouth daily. 09/18/16  Yes [provider]  meloxicam (MOBIC) 15 MG tablet Take 15 mg by mouth daily as needed for pain.  09/01/16  Yes [provider]  hydrOXYzine (ATARAX/VISTARIL)  25 MG tablet Take 1 tablet (25 mg total) by mouth every 6 (six) hours. 12/19/17   Liberty Handy, PA-C    Family History Family History  Problem Relation Age of Onset  . Hypertension Mother   . Diabetes Father   . Cerebrovascular Disease Father   . Alcohol abuse Father   . Hypertension Father   . COPD Father     Social History Social History   Tobacco Use  . Smoking status: Never Smoker  . Smokeless tobacco: Never Used  Substance Use Topics  . Alcohol use: No  . Drug use: No     Allergies   Allopurinol; Aspirin; Prednisone; Rosuvastatin; Simvastatin; and Tuberculin tests   Review of Systems Review of Systems  HENT:       Scalp discomfort   Hematological: Bruises/bleeds easily.  All other systems reviewed and are negative.    Physical Exam Updated Vital Signs BP 136/72   Pulse (!) 52   Temp 98.5 F (36.9 C) (Oral)   Resp 17   Ht 6' 2.5" (1.892 m)   Wt 117 kg   SpO2 96%   BMI 32.67 kg/m   Physical Exam  Constitutional: He is oriented to person, place, and time. He appears well-developed and well-nourished. No  distress.  NAD.  HENT:  Head: Normocephalic and atraumatic.  Right Ear: External ear normal.  Left Ear: External ear normal.  Nose: Nose normal.  Skin over scalp is smooth, without edema, erythema, abrasions, tenderness.  Eyes: Conjunctivae and EOM are normal. No scleral icterus.  Neck: Normal range of motion. Neck supple.  C-spine: No midline tenderness.  Full active R OM without pain.  Chin to chest normal without rigidity.  Cardiovascular: Normal rate, regular rhythm and normal heart sounds.  Pulmonary/Chest: Effort normal and breath sounds normal.  Musculoskeletal: Normal range of motion. He exhibits no deformity.  Neurological: He is alert and oriented to person, place, and time.  Alert and oriented to self, place, time and event.  Speech is fluent without obvious dysarthria or aphasia. Strength 5/5 in upper and lower extremities     Sensation to light touch intact in bilateral face, upper and lower extremities Normal gait. No pronator drift. No leg drop.  Normal finger-to-nose and finger tapping.  CN II-XII grossly intact bilaterally.   Skin: Skin is warm and dry. Capillary refill takes less than 2 seconds.  Psychiatric: He has a normal mood and affect. His behavior is normal. Judgment and thought content normal.  Nursing note and vitals reviewed.    ED Treatments / Results  Labs (all labs ordered are listed, but only abnormal results are displayed) Labs Reviewed  CBC WITH DIFFERENTIAL/PLATELET - Abnormal; Notable for the following components:      Result Value   Hemoglobin 12.7 (*)    All other components within normal limits  COMPREHENSIVE METABOLIC PANEL - Abnormal; Notable for the following components:   Sodium 134 (*)    Glucose, Bld 108 (*)    Creatinine, Ser 2.07 (*)    GFR calc non Af Amer 31 (*)    GFR calc Af Amer 36 (*)    All other components within normal limits  URINE CULTURE    EKG None  Radiology Mr Maxine Glenn Head Wo Contrast  Result Date: 12/17/2017 CLINICAL DATA:  Stroke follow-up EXAM: MRA HEAD WITHOUT CONTRAST TECHNIQUE: Angiographic images of the Circle of Willis were obtained using MRA technique without intravenous contrast. COMPARISON:  Brain MRI 12/17/2017 FINDINGS: ANTERIOR CIRCULATION: --Intracranial internal carotid arteries: Normal. --Anterior cerebral arteries: Normal. Both A1 segments are present. Patent anterior communicating artery. --Middle cerebral arteries: Normal. --Posterior communicating arteries: Present bilaterally. POSTERIOR CIRCULATION: --Basilar artery: Normal. --Posterior cerebral arteries: Normal. --Superior cerebellar arteries: Normal. --Inferior cerebellar arteries: Normal anterior and posterior inferior cerebellar arteries. IMPRESSION: Normal intracranial MRA. Electronically Signed   By: Deatra Robinson M.D.   On: 12/17/2017 22:09    Procedures Procedures  (including critical care time)  Medications Ordered in ED Medications - No data to display   Initial Impression / Assessment and Plan / ED Course  I have reviewed the triage vital signs and the nursing notes.  Pertinent labs & imaging results that were available during my care of the patient were reviewed by me and considered in my medical decision making (see chart for details).    67 year old with recently diagnosed CVA, discharged from the hospital yesterday, returns to the ER for discomfort to his scalp, constant since September 28.  Symptoms briefly worsened and he became concerned and came to the ER.  His neuro exam is normal.  He denies any stroke symptoms. Skin over the scalp is normal.  Recurrent or expanding stroke is considered but less likely.  No signs of abscess or cellulitis.  No head trauma.  The source of symptoms is unclear but I doubt new or worsening CVA, meningitis, infectious process.  Patient admits that he has been dealing with sad, depressive mood recently that has worsened since stroke.  He has no SI, HI or AVH.  He has been trying to deal with this at home by going to the gym.  He does admit that the sensation over his scalp worsens when he is concentrating or when he is anxious.  Patient has follow-up with his PCP in 3 days.  I reassured patient that he does not have any new stroke symptoms and his exam is normal, and symptomatology is consistent or suggestive of stroke.  Labs ordered at triage reviewed and unremarkable.  I do not think that there is indication for emergent MRI or further work-up in the ER.  Will discharge with hydroxyzine although patient does not want to take any medications for his mood at this time.  He will follow-up with his PCP in 3 days and discuss his scalp sensation and recent mood changes.  Discussed return precautions.  Patient is in agreement.   Final Clinical Impressions(s) / ED Diagnoses   Final diagnoses:  Sad mood  Scalp pain    ED  Discharge Orders         Ordered    hydrOXYzine (ATARAX/VISTARIL) 25 MG tablet  Every 6 hours     12/19/17 1822           Jerrell Mylar 12/19/17 Dia Sitter, MD 12/20/17 [phone]

## 2017-12-19 NOTE — ED Notes (Signed)
ED Provider at bedside. 

## 2017-12-19 NOTE — ED Notes (Signed)
Pt state she understands instruction and states that all symptoms have resolved.

## 2017-12-19 NOTE — ED Provider Notes (Signed)
Medical screening examination/treatment/procedure(s) were conducted as a shared visit with non-physician practitioner(s) and myself.  I personally evaluated the patient during the encounter.  67 year old male with a recent stroke presents emerged department today secondary to some type of tingling paresthesias in the rest of his head.  States he had this symptom before when he had a stroke but no other symptoms.  He has no neurologic symptoms otherwise here today.  Has not started driving again yet.  Also endorses depression without suicidality.  Low suspicion for neurologic causes for symptoms at this time stable for discharge follow-up PCP.    Marily Memos, MD 12/20/17 [phone]

## 2017-12-19 NOTE — Consult Note (Signed)
            [facility] CM Primary Care Navigator  12/19/2017  Darrell Perkins 08-May-1950 [id number]   Wentto seepatient at the bedsideto identify possible discharge needs but he was alreadydischarged home per staff.  Per MD note, patient presented with persistent difficulty concentrating/ focusing, confusion; not feeling right and fluttering of the heart with MRI of the brain done on presentation revealed acute infarct right posterior temporal/ parietal white matter. Patient was admitted for further assessment and management of acute CVA (cerebrovascular accident).  Primary care provider's officeis listed as providing transition of care (TOC) follow-up.  Patient has discharge instruction to follow-up with primary care provider within1- 2 weeks and stroke clinic/ neurology follow-up in 4 weeks.  Noted order for EMMI Stroke calls to follow-up recovery at home already in place.   For additional questions please contact:  Karin Golden A. Gissele Narducci, BSN, RN-BC [facility] PRIMARY CARE Navigator Cell: [phone]

## 2017-12-19 NOTE — ED Triage Notes (Signed)
Pt reports being d/c from the hospital yesterday for several "mini strokes" he's c/o his head feeling "loaded" when questioned further he states its feels like a pressure to the top right side of his head but that its not painful. States that it feels better when he touches it.

## 2017-12-19 NOTE — Discharge Instructions (Signed)
You were seen in the ER for pressure sensation to your scalp.  Your exam was normal.  We discussed that a new or worsening stroke is unlikely.  I have given you information of warning signs of a stroke.  Rate this and return to the ER for any concerning signs or symptoms.  We discussed your recent changes in mood.  This may be from recent life changes.  I have given you hydroxyzine to use for anxiety and to help with your nervousness.  Return to the ER if you have any thoughts of hurting herself or others.  Follow-up with your primary care doctor on Friday as scheduled for further discussion of your scalp pressure and your recent mood changes.

## 2017-12-20 DIAGNOSIS — I4892 Unspecified atrial flutter: Secondary | ICD-10-CM

## 2017-12-20 LAB — URINE CULTURE: Culture: NO GROWTH

## 2017-12-22 NOTE — Progress Notes (Signed)
NEUROLOGY CONSULTATION NOTE  Darrell Perkins MRN: [id number] DOB: June 11, 1950  Referring provider: [name], MD Primary care provider: [name], MD  Reason for consult:  stroke  HISTORY OF PRESENT ILLNESS: Darrell Perkins is a 67 year old right-handed male with paroxysmal atrial fibrillation, congestive heart failure, CKD stage 3, hypertension, and sleep apnea who presents for recent stroke.  History supplemented by hospital records.  He was admitted to [facility] [place] from 12/17/17 to 12/18/17 for stroke.  About 2 weeks prior to presentation, he started experiencing headache, neck and back pain and difficulty focusing.  He is a Optician, dispensing at [employer] and was having difficulty reading his lessons.  His PCP prescribed him antibiotics for presumed sinusitis.  When symptoms didn't subside, a head CT without contrast was performed on 12/12/2017, which demonstrated chronic small vessel ischemic changes, including prior right posterior temporal/parietal/occipital junctional infarct and infarct djacent to the right centrum semiovale.  He was placed on Plavix.  Due to persistent confusion, not feeling right, and feeling heart flutter, he returned to the hospital on day of admission.  MRI of the brain revealed right posterior temporal/parietal infarct.  MRA of head revealed no large vessel occlusion or stenosis.  Carotid Doppler revealed 1-39% bilateral ICA stenosis with antegrade flow of both vertebral arteries.  2D echocardiogram demonstrated ejection fraction of 55-60% with no source of embolus.  LDL was 127.  Hemoglobin A1c was 6.6.  Due to history of atrial fibrillation, Plavix was stopped and he was started on Eliquis for secondary stroke prevention.  He was discharged on Zetia.  PAST MEDICAL HISTORY: Past Medical History:  Diagnosis Date  . Bronchitis    hx of   . CHF (congestive heart failure) (HCC)    EF 45%  . 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 GERD (gastroesophageal reflux  disease)   . Hypertensive cardiovascular disease   . Kidney stones   . Pneumonia    hx of   . Rapid atrial fibrillation (HCC) 11/26/2014  . Sleep apnea    USES CPAP  . Typical atrial flutter (HCC) 10/2016    PAST SURGICAL HISTORY: Past Surgical History:  Procedure Laterality Date  . colonscopy     . CYSTOSCOPY WITH RETROGRADE PYELOGRAM, URETEROSCOPY AND STENT PLACEMENT Right 08/01/2014   Procedure: CYSTOSCOPY WITH RIGHT  RETROGRADE PYELOGRAM/RIGHT URETEROSCOPY HOLMIUMN LASER LITOTRIPSY  STENT PLACEMENT ;  Surgeon: Jerilee Field, MD;  Location: [facility];  Service: Urology;  Laterality: Right;  . URETEROSCOPY WITH HOLMIUM LASER LITHOTRIPSY Right 11/18/2016   Procedure: URETEROSCOPY WITH HOLMIUM LASER LITHOTRIPSY/STENT PLACEMENT;  Surgeon: Jerilee Field, MD;  Location: [facility];  Service: Urology;  Laterality: Right;    MEDICATIONS: Current Outpatient Medications on File Prior to Visit  Medication Sig Dispense Refill  . acetaminophen (TYLENOL) 650 MG CR tablet Take 650 mg by mouth 2 (two) times daily as needed for pain.    Marland Kitchen albuterol (PROVENTIL HFA;VENTOLIN HFA) 108 (90 BASE) MCG/ACT inhaler Inhale 1-2 puffs into the lungs every 6 (six) hours as needed for wheezing. 1 Inhaler 0  . apixaban (ELIQUIS) 5 MG TABS tablet Take 1 tablet (5 mg total) by mouth every 12 (twelve) hours. Additional refills by PCP, Neurology, or Cardiology (Patient taking differently: Take 5 mg by mouth every 12 (twelve) hours. ) 60 tablet 0  . Bee Pollen 580 MG CAPS Take 1,160 mg by mouth daily.     . budesonide-formoterol (SYMBICORT) 160-4.5 MCG/ACT inhaler Inhale 2 puffs into the lungs every  other day.     . Cholecalciferol (VITAMIN D) 2000 units tablet Take 2,000 Units by mouth daily.    Marland Kitchen ezetimibe (ZETIA) 10 MG tablet Take 10 mg by mouth daily.  3  . fluticasone (FLONASE) 50 MCG/ACT nasal spray Place 1 spray into both nostrils daily.  2  . hydrOXYzine (ATARAX/VISTARIL) 25 MG tablet Take 1 tablet (25 mg total)  by mouth every 6 (six) hours. 12 tablet 0  . losartan (COZAAR) 100 MG tablet Take 100 mg by mouth daily.  3  . meloxicam (MOBIC) 15 MG tablet Take 15 mg by mouth daily as needed for pain.   1   No current facility-administered medications on file prior to visit.     ALLERGIES: Allergies  Allergen Reactions  . Allopurinol Other (See Comments)    "Upset stomach"  . Aspirin Other (See Comments)    Upset stomach  . Prednisone Other (See Comments)    "Irritable"  . Rosuvastatin Other (See Comments)    Mental disturbance  . Simvastatin Other (See Comments)    Tired and achy  . Tuberculin Tests Other (See Comments)    Intense local reaction    FAMILY HISTORY: Family History  Problem Relation Age of Onset  . Hypertension Mother   . Diabetes Father   . Cerebrovascular Disease Father   . Alcohol abuse Father   . Hypertension Father   . COPD Father    SOCIAL HISTORY: Social History   Socioeconomic History  . Marital status: Married    Spouse name: Not on file  . Number of children: Not on file  . Years of education: Not on file  . Highest education level: Not on file  Occupational History  . Not on file  Social Needs  . Financial resource strain: Not on file  . Food insecurity:    Worry: Not on file    Inability: Not on file  . Transportation needs:    Medical: Not on file    Non-medical: Not on file  Tobacco Use  . Smoking status: Never Smoker  . Smokeless tobacco: Never Used  Substance and Sexual Activity  . Alcohol use: No  . Drug use: No  . Sexual activity: Not on file  Lifestyle  . Physical activity:    Days per week: Not on file    Minutes per session: Not on file  . Stress: Not on file  Relationships  . Social connections:    Talks on phone: Not on file    Gets together: Not on file    Attends religious service: Not on file    Active member of club or organization: Not on file    Attends meetings of clubs or organizations: Not on file     Relationship status: Not on file  . Intimate partner violence:    Fear of current or ex partner: Not on file    Emotionally abused: Not on file    Physically abused: Not on file    Forced sexual activity: Not on file  Other Topics Concern  . 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    REVIEW OF SYSTEMS: Constitutional: No fevers, chills, or sweats, no generalized fatigue, change in appetite Eyes: No visual changes, double vision, eye pain Ear, nose and throat: No hearing loss, ear pain, nasal congestion, sore throat Cardiovascular: No chest pain, palpitations Respiratory:  No shortness of breath at rest or with  exertion, wheezes GastrointestinaI: No nausea, vomiting, diarrhea, abdominal pain, fecal incontinence Genitourinary:  No dysuria, urinary retention or frequency Musculoskeletal:  No neck pain, back pain Integumentary: No rash, pruritus, skin lesions Neurological: as above Psychiatric: No depression, insomnia, anxiety Endocrine: No palpitations, fatigue, diaphoresis, mood swings, change in appetite, change in weight, increased thirst Hematologic/Lymphatic:  No purpura, petechiae. Allergic/Immunologic: no itchy/runny eyes, nasal congestion, recent allergic reactions, rashes  PHYSICAL EXAM: Blood pressure 122/64, pulse 65, height 6' 2.5" (1.892 m), weight 235 lb (106.6 kg), SpO2 98 %. General: No acute distress.  Patient appears well-groomed.   Head:  Normocephalic/atraumatic Eyes:  fundi examined but not visualized Neck: supple, no paraspinal tenderness, full range of motion Back: No paraspinal tenderness Heart: regular rate and rhythm Lungs: Clear to auscultation bilaterally. Vascular: No carotid bruits. Neurological Exam: Mental status: alert and oriented to person, place, and time, recent and remote memory intact, fund of knowledge intact, attention and concentration intact, speech fluent and not dysarthric,  language intact. Cranial nerves: CN I: not tested CN II: pupils equal, round and reactive to light, visual fields intact CN III, IV, VI:  full range of motion, no nystagmus, no ptosis CN V: facial sensation intact CN VII: upper and lower face symmetric CN VIII: hearing intact CN IX, X: gag intact, uvula midline CN XI: sternocleidomastoid and trapezius muscles intact CN XII: tongue midline Bulk & Tone: normal, no fasciculations. Motor:  5/5 throughout  Sensation:  temperature and vibration sensation intact. Deep Tendon Reflexes:  2+ throughout, toes downgoing.   Finger to nose testing:  Without dysmetria.   Heel to shin:  Without dysmetria.   Gait:  Normal station and stride.  Romberg negative  IMPRESSION: 1.  Right temporal/parietal/occipital junction infarct secondary to small vessel disease versus cardioembolic source 2.  Atrial flutter 3.  Hypertension 4.  Hyperlipidemia  PLAN: 1.  Continue Eliquis for secondary stroke prevention 2.  Continue Zetia as per PCP (LDL goal less than 70) 3.  Continue blood pressure control 4.  Routine exercise (joined the gym) 5.  Mediterranean diet 6.  Follow up in 4 months.  Thank you for allowing me to take part in the care of this patient.  Shon Millet, DO  CC: [name], MD

## 2017-12-25 ENCOUNTER — Other Ambulatory Visit: Payer: Self-pay

## 2017-12-25 ENCOUNTER — Ambulatory Visit: Payer: Medicare HMO | Admitting: Neurology

## 2017-12-25 ENCOUNTER — Encounter: Payer: Self-pay | Admitting: Neurology

## 2017-12-25 VITALS — BP 122/64 | HR 65 | Ht 74.5 in | Wt 235.0 lb

## 2017-12-25 DIAGNOSIS — I10 Essential (primary) hypertension: Secondary | ICD-10-CM | POA: Diagnosis not present

## 2017-12-25 DIAGNOSIS — I639 Cerebral infarction, unspecified: Secondary | ICD-10-CM | POA: Diagnosis not present

## 2017-12-25 DIAGNOSIS — I480 Paroxysmal atrial fibrillation: Secondary | ICD-10-CM

## 2017-12-25 DIAGNOSIS — E785 Hyperlipidemia, unspecified: Secondary | ICD-10-CM | POA: Diagnosis not present

## 2017-12-25 NOTE — Patient Instructions (Addendum)
1.  Continue Eliquis 2.  Continue Zetia 3.  Continue blood pressure medication 4.  Continue going to the Gym 5.  Follow Mediterranean diet (See below) 6.  Follow up in 4 months   Mediterranean Diet A Mediterranean diet refers to food and lifestyle choices that are based on the traditions of countries located on the [employer] [place]. This way of eating has been shown to help prevent certain conditions and improve outcomes for people who have chronic diseases, like kidney disease and heart disease. What are tips for following this plan? Lifestyle  Cook and eat meals together with your family, when possible.  Drink enough fluid to keep your urine clear or pale yellow.  Be physically active every day. This includes: ? Aerobic exercise like running or swimming. ? Leisure activities like gardening, walking, or housework.  Get 7-8 hours of sleep each night.  If recommended by your health care provider, drink red wine in moderation. This means 1 glass a day for nonpregnant women and 2 glasses a day for men. A glass of wine equals 5 oz (150 mL). Reading food labels  Check the serving size of packaged foods. For foods such as rice and pasta, the serving size refers to the amount of cooked product, not dry.  Check the total fat in packaged foods. Avoid foods that have saturated fat or trans fats.  Check the ingredients list for added sugars, such as corn syrup. Shopping  At the grocery store, buy most of your food from the areas near the walls of the store. This includes: ? Fresh fruits and vegetables (produce). ? Grains, beans, nuts, and seeds. Some of these may be available in unpackaged forms or large amounts (in bulk). ? Fresh seafood. ? Poultry and eggs. ? Low-fat dairy products.  Buy whole ingredients instead of prepackaged foods.  Buy fresh fruits and vegetables in-season from local farmers markets.  Buy frozen fruits and vegetables in resealable bags.  If you do not have  access to quality fresh seafood, buy precooked frozen shrimp or canned fish, such as tuna, salmon, or sardines.  Buy small amounts of raw or cooked vegetables, salads, or olives from the deli or salad bar at your store.  Stock your pantry so you always have certain foods on hand, such as olive oil, canned tuna, canned tomatoes, rice, pasta, and beans. Cooking  Cook foods with extra-virgin olive oil instead of using butter or other vegetable oils.  Have meat as a side dish, and have vegetables or grains as your main dish. This means having meat in small portions or adding small amounts of meat to foods like pasta or stew.  Use beans or vegetables instead of meat in common dishes like chili or lasagna.  Experiment with different cooking methods. Try roasting or broiling vegetables instead of steaming or sauteing them.  Add frozen vegetables to soups, stews, pasta, or rice.  Add nuts or seeds for added healthy fat at each meal. You can add these to yogurt, salads, or vegetable dishes.  Marinate fish or vegetables using olive oil, lemon juice, garlic, and fresh herbs. Meal planning  Plan to eat 1 vegetarian meal one day each week. Try to work up to 2 vegetarian meals, if possible.  Eat seafood 2 or more times a week.  Have healthy snacks readily available, such as: ? Vegetable sticks with hummus. ? [place] yogurt. ? Fruit and nut trail mix.  Eat balanced meals throughout the week. This includes: ? Fruit: 2-3 servings  a day ? Vegetables: 4-5 servings a day ? Low-fat dairy: 2 servings a day ? Fish, poultry, or lean meat: 1 serving a day ? Beans and legumes: 2 or more servings a week ? Nuts and seeds: 1-2 servings a day ? Whole grains: 6-8 servings a day ? Extra-virgin olive oil: 3-4 servings a day  Limit red meat and sweets to only a few servings a month What are my food choices?  Mediterranean diet ? Recommended ? Grains: Whole-grain pasta. Brown rice. Bulgar wheat. Polenta.  Couscous. Whole-wheat bread. [place]. ? Vegetables: Artichokes. Beets. Broccoli. Cabbage. Carrots. Eggplant. Green beans. Chard. Kale. Spinach. Onions. Leeks. Peas. Squash. Tomatoes. Peppers. Radishes. ? Fruits: Apples. Apricots. Avocado. Berries. Bananas. Cherries. Dates. Figs. Grapes. Lemons. Melon. Oranges. Peaches. Plums. Pomegranate. ? Meats and other protein foods: Beans. Almonds. Sunflower seeds. Pine nuts. Peanuts. [place]. Salmon. Scallops. Shrimp. [place]. Tilapia. Clams. Oysters. Eggs. ? Dairy: Low-fat milk. Cheese. Greek yogurt. ? Beverages: Water. Red wine. Herbal tea. ? Fats and oils: Extra virgin olive oil. Avocado oil. Grape seed oil. ? Sweets and desserts: [place] yogurt with honey. Baked apples. Poached pears. Trail mix. ? Seasoning and other foods: Basil. Cilantro. Coriander. Cumin. Mint. Parsley. Sage. Rosemary. Tarragon. Garlic. Oregano. Thyme. Pepper. Balsalmic vinegar. Tahini. Hummus. Tomato sauce. Olives. Mushrooms. ? Limit these ? Grains: Prepackaged pasta or rice dishes. Prepackaged cereal with added sugar. ? Vegetables: Deep fried potatoes (french fries). ? Fruits: Fruit canned in syrup. ? Meats and other protein foods: Beef. Pork. Lamb. Poultry with skin. Hot dogs. [place] Salines. ? Dairy: Ice cream. Sour cream. Whole milk. ? Beverages: Juice. Sugar-sweetened soft drinks. Beer. Liquor and spirits. ? Fats and oils: Butter. Canola oil. Vegetable oil. Beef fat (tallow). Lard. ? Sweets and desserts: Cookies. Cakes. Pies. Candy. ? Seasoning and other foods: Mayonnaise. Premade sauces and marinades. ? The items listed may not be a complete list. Talk with your dietitian about what dietary choices are right for you. Summary  The Mediterranean diet includes both food and lifestyle choices.  Eat a variety of fresh fruits and vegetables, beans, nuts, seeds, and whole grains.  Limit the amount of red meat and sweets that you eat.  Talk with your health care provider about whether  it is safe for you to drink red wine in moderation. This means 1 glass a day for nonpregnant women and 2 glasses a day for men. A glass of wine equals 5 oz (150 mL). This information is not intended to replace advice given to you by your health care provider. Make sure you discuss any questions you have with your health care provider. Document Released: 10/15/2015 Document Revised: 11/17/2015 Document Reviewed: 10/15/2015 Elsevier Interactive Patient Education  [employer].

## 2017-12-26 ENCOUNTER — Ambulatory Visit: Payer: Medicare HMO | Admitting: Cardiology

## 2017-12-26 ENCOUNTER — Encounter: Payer: Self-pay | Admitting: Cardiology

## 2017-12-26 VITALS — BP 128/74 | HR 62 | Ht 74.5 in | Wt 236.0 lb

## 2017-12-26 DIAGNOSIS — N183 Chronic kidney disease, stage 3 unspecified: Secondary | ICD-10-CM

## 2017-12-26 DIAGNOSIS — I10 Essential (primary) hypertension: Secondary | ICD-10-CM

## 2017-12-26 DIAGNOSIS — I483 Typical atrial flutter: Secondary | ICD-10-CM

## 2017-12-26 DIAGNOSIS — Z7901 Long term (current) use of anticoagulants: Secondary | ICD-10-CM

## 2017-12-26 DIAGNOSIS — G4733 Obstructive sleep apnea (adult) (pediatric): Secondary | ICD-10-CM

## 2017-12-26 DIAGNOSIS — Z9989 Dependence on other enabling machines and devices: Secondary | ICD-10-CM

## 2017-12-26 DIAGNOSIS — I6381 Other cerebral infarction due to occlusion or stenosis of small artery: Secondary | ICD-10-CM

## 2017-12-26 NOTE — Assessment & Plan Note (Signed)
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

## 2017-12-26 NOTE — Patient Instructions (Signed)
Medication Instructions:  Your physician recommends that you continue on your current medications as directed. Please refer to the Current Medication list given to you today.  If you need a refill on your cardiac medications before your next appointment, please call your pharmacy.   Lab work: None  If you have labs (blood work) drawn today and your tests are completely normal, you will receive your results only by: Marland Kitchen MyChart Message (if you have MyChart) OR . A paper copy in the mail If you have any lab test that is abnormal or we need to change your treatment, we will call you to review the results.  Testing/Procedures: None   Follow-Up: At [facility], you and your health needs are our priority.  As part of our continuing mission to provide you with exceptional heart care, we have created designated Provider Care Teams.  These Care Teams include your primary Cardiologist (physician) and Advanced Practice Providers (APPs -  Physician Assistants and Nurse Practitioners) who all work together to provide you with the care you need, when you need it. Your physician recommends that you schedule a follow-up appointment in: Dr [name] before January 2020 and before appt with Dr Royann Shivers IF POSSIBLE.  Any Other Special Instructions Will Be Listed Below (If Applicable).

## 2017-12-26 NOTE — Assessment & Plan Note (Signed)
Normal LVF with mild LVH by echo Oct 2019

## 2017-12-26 NOTE — Assessment & Plan Note (Signed)
Reports compliance

## 2017-12-26 NOTE — Assessment & Plan Note (Signed)
CHADS2VASC is 5- Eliquis 5 mg BID added Oct 2019

## 2017-12-26 NOTE — Progress Notes (Signed)
12/26/2017 Darrell Perkins   [birth year]  [id number]  Primary Physician [name], MD Primary Cardiologist: Dr Harlin Heys [name]  HPI:  The patient is a pleasant 67 y.o. AA male with a hx of atrial flutter requiring a brief hospitalization in August 2018 asthma, obstructive sleep apnea-on C-pap, chronic kidney disease stage III, nephrolithiasis, and essential hypertension.  He briefly took anticoagulants in the past but stopped them due to hematuria from nephrolithiasis. In late Sept 2019 he saw his PCP for "headaches". He was treated for sinusitis but did not improve. A CT of his head was ordered by his PCP 12/12/17. This apparently revealed a small CVA. A few days later the patient went to the ED with vague symptoms, w/u was unremarkable and he was discharged. He woke up later that night confused and had his son take him back to the ED. He was admitted and an MRI on 12/17/17 showed an acute right posterior temporal/parietal white matter infarct. He was seen in consult by Dr [name]. Eliquis was added. He was seen in consult by Dr [name] as well because of the pt's history of atrial flutter. Dr [name] has indicated he thinks ablation should be considered (no A flutter documented this past admission). Echo showed normal LVF, mild LVH. Carotid dopplers were normal. He is in the office today for follow up. Overall he feels "OK" but he says "this stroke is still messing with my mind". He denies any confusion, blurred vision, or weakness. He has not noted any hematuria since he started Eliquis.    Current Outpatient Medications  Medication Sig Dispense Refill  . acetaminophen (TYLENOL) 650 MG CR tablet Take 650 mg by mouth 2 (two) times daily as needed for pain.    Marland Kitchen albuterol (PROVENTIL HFA;VENTOLIN HFA) 108 (90 BASE) MCG/ACT inhaler Inhale 1-2 puffs into the lungs every 6 (six) hours as needed for wheezing. 1 Inhaler 0  . apixaban (ELIQUIS) 5 MG TABS tablet Take 1 tablet (5 mg total) by mouth every 12  (twelve) hours. Additional refills by PCP, Neurology, or Cardiology (Patient taking differently: Take 5 mg by mouth every 12 (twelve) hours. ) 60 tablet 0  . Bee Pollen 580 MG CAPS Take 1,160 mg by mouth daily.     . budesonide-formoterol (SYMBICORT) 160-4.5 MCG/ACT inhaler Inhale 2 puffs into the lungs every other day.     . Cholecalciferol (VITAMIN D) 2000 units tablet Take 2,000 Units by mouth daily.    Marland Kitchen ezetimibe (ZETIA) 10 MG tablet Take 10 mg by mouth daily.  3  . fluticasone (FLONASE) 50 MCG/ACT nasal spray Place 1 spray into both nostrils daily.  2  . hydrOXYzine (ATARAX/VISTARIL) 25 MG tablet Take 1 tablet (25 mg total) by mouth every 6 (six) hours. 12 tablet 0  . losartan (COZAAR) 100 MG tablet Take 100 mg by mouth daily.  3  . meloxicam (MOBIC) 15 MG tablet Take 15 mg by mouth daily as needed for pain.   1   No current facility-administered medications for this visit.     Allergies  Allergen Reactions  . Allopurinol Other (See Comments)    "Upset stomach"  . Aspirin Other (See Comments)    Upset stomach  . Prednisone Other (See Comments)    "Irritable"  . Rosuvastatin Other (See Comments)    Mental disturbance  . Simvastatin Other (See Comments)    Tired and achy  . Tuberculin Tests Other (See Comments)    Intense local reaction    Past  Medical History:  Diagnosis Date  . Bronchitis    hx of   . CHF (congestive heart failure) (HCC)    EF 45%  . 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 GERD (gastroesophageal reflux disease)   . Hypertensive cardiovascular disease   . Kidney stones   . Pneumonia    hx of   . Rapid atrial fibrillation (HCC) 11/26/2014  . Sleep apnea    USES CPAP  . Typical atrial flutter (HCC) 10/2016    Social History   Socioeconomic History  . Marital status: Married    Spouse name: Not on file  . Number of children: Not on file  . Years of education: Not on file  . Highest education level: Not on file  Occupational History  .  Not on file  Social Needs  . Financial resource strain: Not on file  . Food insecurity:    Worry: Not on file    Inability: Not on file  . Transportation needs:    Medical: Not on file    Non-medical: Not on file  Tobacco Use  . Smoking status: Never Smoker  . Smokeless tobacco: Never Used  Substance and Sexual Activity  . Alcohol use: No  . Drug use: No  . Sexual activity: Not on file  Lifestyle  . Physical activity:    Days per week: Not on file    Minutes per session: Not on file  . Stress: Not on file  Relationships  . Social connections:    Talks on phone: Not on file    Gets together: Not on file    Attends religious service: Not on file    Active member of club or organization: Not on file    Attends meetings of clubs or organizations: Not on file    Relationship status: Not on file  . Intimate partner violence:    Fear of current or ex partner: Not on file    Emotionally abused: Not on file    Physically abused: Not on file    Forced sexual activity: Not on file  Other Topics Concern  . Not on file  Social History Narrative   Pt is right handed   Pt lives in single story home with his wife and grandson   Has 3 children   12th grade education   Retired from [employer] - [employer]s.      Family History  Problem Relation Age of Onset  . Hypertension Mother   . Diabetes Father   . Cerebrovascular Disease Father   . Alcohol abuse Father   . Hypertension Father   . COPD Father      Review of Systems: General: negative for chills, fever, night sweats or weight changes.  Cardiovascular: negative for chest pain, dyspnea on exertion, edema, orthopnea, palpitations, paroxysmal nocturnal dyspnea or shortness of breath Dermatological: negative for rash Respiratory: negative for cough or wheezing Urologic: negative for hematuria Abdominal: negative for nausea, vomiting, diarrhea, bright red blood per rectum, melena, or hematemesis Neurologic: negative  for visual changes, syncope, or dizziness All other systems reviewed and are otherwise negative except as noted above.    Blood pressure 128/74, pulse 62, height 6' 2.5" (1.892 m), weight 236 lb (107 kg).  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  Atrial flutter (HCC) Past documented atrial flutter  Anticoagulated CHADS2VASC is 5- Eliquis 5 mg BID added Oct 2019  Chronic kidney disease, stage III (moderate) (HCC) SCr 2.0  Essential hypertension Normal LVF with mild LVH by echo Oct 2019  OSA on CPAP Reports compliance    PLAN  Same Rx. I will arrange f/u with Dr [name] to discuss RFA.   Corine Shelter PA-C 12/26/2017 12:28 PM

## 2017-12-26 NOTE — Progress Notes (Signed)
Thanks MCr 

## 2017-12-26 NOTE — Assessment & Plan Note (Signed)
Past documented atrial flutter

## 2017-12-26 NOTE — Assessment & Plan Note (Signed)
SCr 2.0

## 2017-12-26 NOTE — Addendum Note (Signed)
Addended by: Kandice Robinsons T on: 12/26/2017 03:19 PM   Modules accepted: Orders

## 2017-12-28 DIAGNOSIS — N200 Calculus of kidney: Secondary | ICD-10-CM | POA: Diagnosis not present

## 2017-12-28 DIAGNOSIS — N400 Enlarged prostate without lower urinary tract symptoms: Secondary | ICD-10-CM | POA: Diagnosis not present

## 2017-12-29 DIAGNOSIS — F419 Anxiety disorder, unspecified: Secondary | ICD-10-CM | POA: Diagnosis not present

## 2017-12-29 DIAGNOSIS — I639 Cerebral infarction, unspecified: Secondary | ICD-10-CM | POA: Diagnosis not present

## 2017-12-29 DIAGNOSIS — I4891 Unspecified atrial fibrillation: Secondary | ICD-10-CM | POA: Diagnosis not present

## 2017-12-29 DIAGNOSIS — Z7901 Long term (current) use of anticoagulants: Secondary | ICD-10-CM | POA: Diagnosis not present

## 2018-01-03 DIAGNOSIS — F419 Anxiety disorder, unspecified: Secondary | ICD-10-CM | POA: Diagnosis not present

## 2018-01-03 DIAGNOSIS — I4891 Unspecified atrial fibrillation: Secondary | ICD-10-CM | POA: Diagnosis not present

## 2018-01-12 ENCOUNTER — Encounter: Payer: Self-pay | Admitting: Internal Medicine

## 2018-01-15 ENCOUNTER — Encounter: Payer: Self-pay | Admitting: Internal Medicine

## 2018-01-15 ENCOUNTER — Ambulatory Visit: Payer: Medicare HMO | Admitting: Internal Medicine

## 2018-01-15 VITALS — BP 132/80 | HR 68 | Ht 74.5 in | Wt 236.2 lb

## 2018-01-15 DIAGNOSIS — I6381 Other cerebral infarction due to occlusion or stenosis of small artery: Secondary | ICD-10-CM | POA: Diagnosis not present

## 2018-01-15 DIAGNOSIS — G4733 Obstructive sleep apnea (adult) (pediatric): Secondary | ICD-10-CM | POA: Diagnosis not present

## 2018-01-15 DIAGNOSIS — I483 Typical atrial flutter: Secondary | ICD-10-CM | POA: Diagnosis not present

## 2018-01-15 DIAGNOSIS — Z9989 Dependence on other enabling machines and devices: Secondary | ICD-10-CM

## 2018-01-15 DIAGNOSIS — I10 Essential (primary) hypertension: Secondary | ICD-10-CM | POA: Diagnosis not present

## 2018-01-15 NOTE — Progress Notes (Signed)
PCP: [name], MD Primary Cardiologist: Dr Royann Shivers Primary EP: Dr Anette Guarneri Darrell Perkins is a 67 y.o. male who presents today for routine electrophysiology followup. I hav not seen in in a year.  He has known atrial flutter but had declined ablation.  He presented with stroke 12/2017.  He was evaluated by Dr [name]. He does not appear to have atrial fibrillation documented.  His chads2vasc score is 5.  He was recently started on eliquis after his stroke.  He was evaluated by Dr [name] who advised anticoagulation with subsequent atrial flutter ablation followed by ILR to monitor for asymptomatic atrial fibrillation.  Today, he denies symptoms of palpitations, chest pain, shortness of breath,  lower extremity edema, dizziness, presyncope, or syncope.  The patient is otherwise without complaint today.   Past Medical History:  Diagnosis Date  . Bronchitis    hx of   . CHF (congestive heart failure) (HCC)    EF 45%  . 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 GERD (gastroesophageal reflux disease)   . Hypertensive cardiovascular disease   . Kidney stones   . Sleep apnea    USES CPAP  . Typical atrial flutter (HCC) 10/2016   Past Surgical History:  Procedure Laterality Date  . colonscopy     . CYSTOSCOPY WITH RETROGRADE PYELOGRAM, URETEROSCOPY AND STENT PLACEMENT Right 08/01/2014   Procedure: CYSTOSCOPY WITH RIGHT  RETROGRADE PYELOGRAM/RIGHT URETEROSCOPY HOLMIUMN LASER LITOTRIPSY  STENT PLACEMENT ;  Surgeon: Jerilee Field, MD;  Location: [facility];  Service: Urology;  Laterality: Right;  . URETEROSCOPY WITH HOLMIUM LASER LITHOTRIPSY Right 11/18/2016   Procedure: URETEROSCOPY WITH HOLMIUM LASER LITHOTRIPSY/STENT PLACEMENT;  Surgeon: Jerilee Field, MD;  Location: [facility];  Service: Urology;  Laterality: Right;    ROS- all systems are reviewed and negatives except as per HPI above  Current Outpatient Medications  Medication Sig Dispense Refill  . acetaminophen (TYLENOL) 650 MG CR  tablet Take 650 mg by mouth 2 (two) times daily as needed for pain.    Marland Kitchen albuterol (PROVENTIL HFA;VENTOLIN HFA) 108 (90 BASE) MCG/ACT inhaler Inhale 1-2 puffs into the lungs every 6 (six) hours as needed for wheezing. 1 Inhaler 0  . apixaban (ELIQUIS) 5 MG TABS tablet Take 1 tablet (5 mg total) by mouth every 12 (twelve) hours. Additional refills by PCP, Neurology, or Cardiology (Patient taking differently: Take 5 mg by mouth every 12 (twelve) hours. ) 60 tablet 0  . Bee Pollen 580 MG CAPS Take 1,160 mg by mouth daily.     . budesonide-formoterol (SYMBICORT) 160-4.5 MCG/ACT inhaler Inhale 2 puffs into the lungs every other day.     . Cholecalciferol (VITAMIN D) 2000 units tablet Take 2,000 Units by mouth daily.    Marland Kitchen ezetimibe (ZETIA) 10 MG tablet Take 10 mg by mouth daily.  3  . fluticasone (FLONASE) 50 MCG/ACT nasal spray Place 1 spray into both nostrils daily.  2  . hydrOXYzine (ATARAX/VISTARIL) 25 MG tablet Take 1 tablet (25 mg total) by mouth every 6 (six) hours. 12 tablet 0  . losartan (COZAAR) 100 MG tablet Take 100 mg by mouth daily.  3  . meloxicam (MOBIC) 15 MG tablet Take 15 mg by mouth daily as needed for pain.   1   No current facility-administered medications for this visit.     Physical Exam: Vitals:   01/15/18 0839  BP: 132/80  Pulse: 68  SpO2: 97%  Weight: 236 lb 3.2 oz (107.1 kg)  Height: 6' 2.5" (  1.892 m)    GEN- The patient is well appearing, alert and oriented x 3 today.   Head- normocephalic, atraumatic Eyes-  Sclera clear, conjunctiva pink Ears- hearing intact Oropharynx- clear Lungs- Clear to ausculation bilaterally, normal work of breathing Heart- Regular rate and rhythm, no murmurs, rubs or gallops, PMI not laterally displaced GI- soft, NT, ND, + BS Extremities- no clubbing, cyanosis, or edema  Wt Readings from Last 3 Encounters:  01/15/18 236 lb 3.2 oz (107.1 kg)  12/26/17 236 lb (107 kg)  12/25/17 235 lb (106.6 kg)    EKG tracing ordered today is  personally reviewed and shows sinus rhythm with PACs in bigeminy  All ekgs in epic are reviewed.  Sinus bradycardia is primary rhythm.  Atrial flutter (Typical) is noted 11/03/2016.  Echo 12/15/17 Reveals EF 55%, mild LVH, normal atrial size  Assessment and Plan:  1. Typical atrial flutter Documented 11/03/16.  Though he carries a h/o afib, this is not well documented. He was recently evaluated by Dr [name] post embolic stroke and ablation was advised for atrial flutter with subsequent ILR monitoring for afib.   Therapeutic strategies for atrial flutter including medicine and ablation were discussed in detail with the patient today.  I have advised long term anticoagulation as being the most appropriate option for him.  He worries that he cannot take long term anticoagulation and admits compliance has been an issue for him previously. I think it may be reasonable to proceed with atrial flutter ablation, with plans also to continue long term anticoagulation and to also monitor for afib going forward with an ILR as advised by Dr [name].  Risk, benefits, and alternatives to EP study and radiofrequency ablation with implantable loop recorder placement were also discussed in detail today. These risks include but are not limited to stroke, bleeding, vascular damage, tamponade, perforation, damage to the heart and other structures, AV block requiring pacemaker, worsening renal function, and death. The patient understands these risk and wishes to proceed.  We will therefore proceed with catheter ablation at the next available time.  Compliance with eliquis is advised in the interim. Carto and anesthesia are requested for this procedure. Given prior stroke and chads2vasc score of 5, I agree with Dr [name] that ILR is appropriate post ablation to evaluate for atrial fibrillation going forward.  2. HTN Stable No change required today   3. OSA Compliance with CPAP encouraged.   He has not been using it  recently.  Return to see me 4 weeks after ablation.  Hillis Range MD, [facility] 01/15/2018 9:16 AM

## 2018-01-15 NOTE — Patient Instructions (Addendum)
Medication Instructions:  Your physician recommends that you continue on your current medications as directed. Please refer to the Current Medication list given to you today.  Labwork: You will get lab work within 30 days of your procedure:  BMP and CBC. Please schedule for lab work prior to procedure on February 16, 2018  Testing/Procedures: Your physician has recommended that you have an ablation. Catheter ablation is a medical procedure used to treat some cardiac arrhythmias (irregular heartbeats). During catheter ablation, a long, thin, flexible tube is put into a blood vessel in your groin (upper thigh), or neck. This tube is called an ablation catheter. It is then guided to your heart through the blood vessel. Radio frequency waves destroy small areas of heart tissue where abnormal heartbeats may cause an arrhythmia to start. Please see the instruction sheet given to you today.  Follow-Up:  You will follow up with [name] 4 weeks after your procedure scheduled for February 16, 2018   ATRIAL FLUTTER ABLATION INSTRUCTIONS:  Please arrive at the [facility] main entrance of [facility] hospital at:  5:30 am on February 16, 2018 Do not eat or drink after midnight prior to procedure DO NOT TAKE ANY medications the morning of the procedure Plan for one night stay-but you should be discharged the same day as your procedure You will need someone to drive you home at discharge  If you need a refill on your cardiac medications before your next appointment, please call your pharmacy.   Cardiac Ablation Cardiac ablation is a procedure to disable (ablate) a small amount of heart tissue in very specific places. The heart has many electrical connections. Sometimes these connections are abnormal and can cause the heart to beat very fast or irregularly. Ablating some of the problem areas can improve the heart rhythm or return it to normal. Ablation may be done for people who:  Have  Wolff-Parkinson-White syndrome.  Have fast heart rhythms (tachycardia).  Have taken medicines for an abnormal heart rhythm (arrhythmia) that were not effective or caused side effects.  Have a high-risk heartbeat that may be life-threatening.  During the procedure, a small incision is made in the neck or the groin, and a long, thin, flexible tube (catheter) is inserted into the incision and moved to the heart. Small devices (electrodes) on the tip of the catheter will send out electrical currents. A type of X-ray (fluoroscopy) will be used to help guide the catheter and to provide images of the heart. Tell a health care provider about:  Any allergies you have.  All medicines you are taking, including vitamins, herbs, eye drops, creams, and over-the-counter medicines.  Any problems you or family members have had with anesthetic medicines.  Any blood disorders you have.  Any surgeries you have had.  Any medical conditions you have, such as kidney failure.  Whether you are pregnant or may be pregnant. What are the risks? Generally, this is a safe procedure. However, problems may occur, including:  Infection.  Bruising and bleeding at the catheter insertion site.  Bleeding into the chest, especially into the sac that surrounds the heart. This is a serious complication.  Stroke or blood clots.  Damage to other structures or organs.  Allergic reaction to medicines or dyes.  Need for a permanent pacemaker if the normal electrical system is damaged. A pacemaker is a small computer that sends electrical signals to the heart and helps your heart beat normally.  The procedure not being fully effective. This may  not be recognized until months later. Repeat ablation procedures are sometimes required.  What happens before the procedure?  Follow instructions from your health care provider about eating or drinking restrictions.  Ask your health care provider about: ? Changing or  stopping your regular medicines. This is especially important if you are taking diabetes medicines or blood thinners. ? Taking medicines such as aspirin and ibuprofen. These medicines can thin your blood. Do not take these medicines before your procedure if your health care provider instructs you not to.  Plan to have someone take you home from the hospital or clinic.  If you will be going home right after the procedure, plan to have someone with you for 24 hours. What happens during the procedure?  To lower your risk of infection: ? Your health care team will wash or sanitize their hands. ? Your skin will be washed with soap. ? Hair may be removed from the incision area.  An IV tube will be inserted into one of your veins.  You will be given a medicine to help you relax (sedative).  The skin on your neck or groin will be numbed.  An incision will be made in your neck or your groin.  A needle will be inserted through the incision and into a large vein in your neck or groin.  A catheter will be inserted into the needle and moved to your heart.  Dye may be injected through the catheter to help your surgeon see the area of the heart that needs treatment.  Electrical currents will be sent from the catheter to ablate heart tissue in desired areas. There are three types of energy that may be used to ablate heart tissue: ? Heat (radiofrequency energy). ? Laser energy. ? Extreme cold (cryoablation).  When the necessary tissue has been ablated, the catheter will be removed.  Pressure will be held on the catheter insertion area to prevent excessive bleeding.  A bandage (dressing) will be placed over the catheter insertion area. The procedure may vary among health care providers and hospitals. What happens after the procedure?  Your blood pressure, heart rate, breathing rate, and blood oxygen level will be monitored until the medicines you were given have worn off.  Your catheter  insertion area will be monitored for bleeding. You will need to lie still for a few hours to ensure that you do not bleed from the catheter insertion area.  Do not drive for 24 hours or as long as directed by your health care provider. Summary  Cardiac ablation is a procedure to disable (ablate) a small amount of heart tissue in very specific places. Ablating some of the problem areas can improve the heart rhythm or return it to normal.  During the procedure, electrical currents will be sent from the catheter to ablate heart tissue in desired areas. This information is not intended to replace advice given to you by your health care provider. Make sure you discuss any questions you have with your health care provider. Document Released: 07/10/2008 Document Revised: 01/11/2016 Document Reviewed: 01/11/2016 Elsevier Interactive Patient Education  2018 [employer].   Cardiac Ablation Cardiac ablation is a procedure to disable (ablate) a small amount of heart tissue in very specific places. The heart has many electrical connections. Sometimes these connections are abnormal and can cause the heart to beat very fast or irregularly. Ablating some of the problem areas can improve the heart rhythm or return it to normal. Ablation may be done for people  who:  Have Wolff-Parkinson-White syndrome.  Have fast heart rhythms (tachycardia).  Have taken medicines for an abnormal heart rhythm (arrhythmia) that were not effective or caused side effects.  Have a high-risk heartbeat that may be life-threatening.  During the procedure, a small incision is made in the neck or the groin, and a long, thin, flexible tube (catheter) is inserted into the incision and moved to the heart. Small devices (electrodes) on the tip of the catheter will send out electrical currents. A type of X-ray (fluoroscopy) will be used to help guide the catheter and to provide images of the heart. Tell a health care provider  about:  Any allergies you have.  All medicines you are taking, including vitamins, herbs, eye drops, creams, and over-the-counter medicines.  Any problems you or family members have had with anesthetic medicines.  Any blood disorders you have.  Any surgeries you have had.  Any medical conditions you have, such as kidney failure.  Whether you are pregnant or may be pregnant. What are the risks? Generally, this is a safe procedure. However, problems may occur, including:  Infection.  Bruising and bleeding at the catheter insertion site.  Bleeding into the chest, especially into the sac that surrounds the heart. This is a serious complication.  Stroke or blood clots.  Damage to other structures or organs.  Allergic reaction to medicines or dyes.  Need for a permanent pacemaker if the normal electrical system is damaged. A pacemaker is a small computer that sends electrical signals to the heart and helps your heart beat normally.  The procedure not being fully effective. This may not be recognized until months later. Repeat ablation procedures are sometimes required.  What happens before the procedure?  Follow instructions from your health care provider about eating or drinking restrictions.  Ask your health care provider about: ? Changing or stopping your regular medicines. This is especially important if you are taking diabetes medicines or blood thinners. ? Taking medicines such as aspirin and ibuprofen. These medicines can thin your blood. Do not take these medicines before your procedure if your health care provider instructs you not to.  Plan to have someone take you home from the hospital or clinic.  If you will be going home right after the procedure, plan to have someone with you for 24 hours. What happens during the procedure?  To lower your risk of infection: ? Your health care team will wash or sanitize their hands. ? Your skin will be washed with  soap. ? Hair may be removed from the incision area.  An IV tube will be inserted into one of your veins.  You will be given a medicine to help you relax (sedative).  The skin on your neck or groin will be numbed.  An incision will be made in your neck or your groin.  A needle will be inserted through the incision and into a large vein in your neck or groin.  A catheter will be inserted into the needle and moved to your heart.  Dye may be injected through the catheter to help your surgeon see the area of the heart that needs treatment.  Electrical currents will be sent from the catheter to ablate heart tissue in desired areas. There are three types of energy that may be used to ablate heart tissue: ? Heat (radiofrequency energy). ? Laser energy. ? Extreme cold (cryoablation).  When the necessary tissue has been ablated, the catheter will be removed.  Pressure will be  held on the catheter insertion area to prevent excessive bleeding.  A bandage (dressing) will be placed over the catheter insertion area. The procedure may vary among health care providers and hospitals. What happens after the procedure?  Your blood pressure, heart rate, breathing rate, and blood oxygen level will be monitored until the medicines you were given have worn off.  Your catheter insertion area will be monitored for bleeding. You will need to lie still for a few hours to ensure that you do not bleed from the catheter insertion area.  Do not drive for 24 hours or as long as directed by your health care provider. Summary  Cardiac ablation is a procedure to disable (ablate) a small amount of heart tissue in very specific places. Ablating some of the problem areas can improve the heart rhythm or return it to normal.  During the procedure, electrical currents will be sent from the catheter to ablate heart tissue in desired areas. This information is not intended to replace advice given to you by your health  care provider. Make sure you discuss any questions you have with your health care provider. Document Released: 07/10/2008 Document Revised: 01/11/2016 Document Reviewed: 01/11/2016 Elsevier Interactive Patient Education  [employer].

## 2018-01-24 NOTE — Progress Notes (Signed)
NEUROLOGY FOLLOW UP OFFICE NOTE  Darrell Perkins [id number]  HISTORY OF PRESENT ILLNESS: Darrell Perkins is a 67 year old right-handed male with paroxysmal atrial fibrillation, congestive heart failure, CKD stage III, hypertension, and sleep apnea who follows up for recent stroke.  UPDATE: He is currently on Eliquis and Zetia. He feels well.  He denies difficulty with performing ADLs.   He denies anxiety or depression at this time.  His mood is good.  He goes to the gym 4 or 5 times a week.  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  He retired in February but he would like to go back part time.    HISTORY: He was admitted to [facility] from 12/17/17 to 12/18/17 for stroke.  About 2 weeks prior to presentation, he started experiencing headache, neck and back pain and difficulty focusing.  He is a Optician, dispensing at [employer] and was having difficulty reading his lessons.  His PCP prescribed him antibiotics for presumed sinusitis.  When symptoms didn't subside, a head CT without contrast was performed on 12/12/2017, which demonstrated chronic small vessel ischemic changes, including prior right posterior temporal/parietal/occipital junctional infarct and infarct djacent to the right centrum semiovale.  He was placed on Plavix.  Due to persistent confusion, not feeling right, and feeling heart flutter, he returned to the hospital on day of admission.  MRI of the brain revealed right posterior temporal/parietal infarct.  MRA of head revealed no large vessel occlusion or stenosis.  Carotid Doppler revealed 1-39% bilateral ICA stenosis with antegrade flow of both vertebral arteries.  2D echocardiogram demonstrated ejection fraction of 55-60% with no source of embolus.  LDL was 127.  Hemoglobin A1c was 6.6.  Due to history of atrial fibrillation, Plavix was stopped and he was started on Eliquis for secondary stroke prevention.  He was discharged on Zetia.  PAST MEDICAL HISTORY: Past Medical History:    Diagnosis Date  . Bronchitis    hx of   . CHF (congestive heart failure) (HCC)    EF 45%  . 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 GERD (gastroesophageal reflux disease)   . Hypertensive cardiovascular disease   . Kidney stones   . Sleep apnea    USES CPAP  . Typical atrial flutter (HCC) 10/2016    MEDICATIONS: Current Outpatient Medications on File Prior to Visit  Medication Sig Dispense Refill  . acetaminophen (TYLENOL) 650 MG CR tablet Take 650 mg by mouth 2 (two) times daily as needed for pain.    Marland Kitchen albuterol (PROVENTIL HFA;VENTOLIN HFA) 108 (90 BASE) MCG/ACT inhaler Inhale 1-2 puffs into the lungs every 6 (six) hours as needed for wheezing. 1 Inhaler 0  . apixaban (ELIQUIS) 5 MG TABS tablet Take 1 tablet (5 mg total) by mouth every 12 (twelve) hours. Additional refills by PCP, Neurology, or Cardiology (Patient taking differently: Take 5 mg by mouth every 12 (twelve) hours. ) 60 tablet 0  . Bee Pollen 580 MG CAPS Take 1,160 mg by mouth daily.     . budesonide-formoterol (SYMBICORT) 160-4.5 MCG/ACT inhaler Inhale 2 puffs into the lungs every other day.     . Cholecalciferol (VITAMIN D) 2000 units tablet Take 2,000 Units by mouth daily.    Marland Kitchen ezetimibe (ZETIA) 10 MG tablet Take 10 mg by mouth daily.  3  . fluticasone (FLONASE) 50 MCG/ACT nasal spray Place 1 spray into both nostrils daily.  2  . hydrOXYzine (ATARAX/VISTARIL) 25 MG tablet Take 1 tablet (  25 mg total) by mouth every 6 (six) hours. 12 tablet 0  . losartan (COZAAR) 100 MG tablet Take 100 mg by mouth daily.  3  . meloxicam (MOBIC) 15 MG tablet Take 15 mg by mouth daily as needed for pain.   1   No current facility-administered medications on file prior to visit.     ALLERGIES: Allergies  Allergen Reactions  . Allopurinol Other (See Comments)    "Upset stomach"  . Aspirin Other (See Comments)    Upset stomach  . Prednisone Other (See Comments)    "Irritable"  . Rosuvastatin Other (See Comments)    Mental  disturbance  . Simvastatin Other (See Comments)    Tired and achy  . Tuberculin Tests Other (See Comments)    Intense local reaction    FAMILY HISTORY: Family History  Problem Relation Age of Onset  . Hypertension Mother   . Diabetes Father   . Cerebrovascular Disease Father   . Alcohol abuse Father   . Hypertension Father   . COPD Father    SOCIAL HISTORY: Social History   Socioeconomic History  . Marital status: Married    Spouse name: Not on file  . Number of children: Not on file  . Years of education: Not on file  . Highest education level: Not on file  Occupational History  . Not on file  Social Needs  . Financial resource strain: Not on file  . Food insecurity:    Worry: Not on file    Inability: Not on file  . Transportation needs:    Medical: Not on file    Non-medical: Not on file  Tobacco Use  . Smoking status: Never Smoker  . Smokeless tobacco: Never Used  Substance and Sexual Activity  . Alcohol use: No  . Drug use: No  . Sexual activity: Not on file  Lifestyle  . Physical activity:    Days per week: Not on file    Minutes per session: Not on file  . Stress: Not on file  Relationships  . Social connections:    Talks on phone: Not on file    Gets together: Not on file    Attends religious service: Not on file    Active member of club or organization: Not on file    Attends meetings of clubs or organizations: Not on file    Relationship status: Not on file  . Intimate partner violence:    Fear of current or ex partner: Not on file    Emotionally abused: Not on file    Physically abused: Not on file    Forced sexual activity: Not on file  Other Topics Concern  . 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     REVIEW OF SYSTEMS: Constitutional: No fevers, chills, or sweats, no  generalized fatigue, change in appetite Eyes: No visual changes, double vision, eye pain Ear, nose and throat: No hearing loss, ear pain, nasal congestion, sore throat Cardiovascular: No chest pain, palpitations Respiratory:  No shortness of breath at rest or with exertion, wheezes GastrointestinaI: No nausea, vomiting, diarrhea, abdominal pain, fecal incontinence Genitourinary:  No dysuria, urinary retention or frequency Musculoskeletal:  No neck pain, back pain Integumentary: No rash, pruritus, skin lesions Neurological: as above Psychiatric: No  depression, insomnia, anxiety Endocrine: No palpitations, fatigue, diaphoresis, mood swings, change in appetite, change in weight, increased thirst Hematologic/Lymphatic:  No purpura, petechiae. Allergic/Immunologic: no itchy/runny eyes, nasal congestion, recent allergic reactions, rashes  PHYSICAL EXAM: Blood pressure 126/76, pulse (!) 51, height 6' 2.5" (1.892 m), weight 242 lb (109.8 kg), SpO2 99 %. General: No acute distress.  Patient appears well-groomed.  Head:  Normocephalic/atraumatic Eyes:  Fundi examined but not visualized Neck: supple, no paraspinal tenderness, full range of motion Heart:  Regular rate and rhythm Lungs:  Clear to auscultation bilaterally Back: No paraspinal tenderness Neurological Exam: alert and oriented to person, place, and time. Attention span and concentration intact, recent and remote memory intact, fund of knowledge intact.  Speech fluent and not dysarthric, language intact.  CN II-XII intact. Bulk and tone normal, muscle strength 5/5 throughout.  Sensation to light touch, temperature and vibration intact.  Deep tendon reflexes 2+ throughout, toes downgoing.  Finger to nose and heel to shin testing intact.  Gait normal, Romberg negative.  IMPRESSION: 1.  Right temporal/parietal/occipital junction infarct secondary to small vessel disease versus cardioembolic source.  From my standpoint, he may return to work  part time. 2.  Atrial flutter 3.  Hypertension 4.  Hyperlipidemia  PLAN: 1.  Continue Eliquis for secondary stroke prevention 2.  Continue Zetia 3.  Continue blood pressure control 4.  Continue routine exercise 5.  Mediterranean diet 6.  Follow up in February as scheduled.  25 minutes spent face to face with patient, over 50% spent discussing management.  Shon Millet, DO  CC: [name], MD

## 2018-01-25 ENCOUNTER — Encounter: Payer: Self-pay | Admitting: Neurology

## 2018-01-25 ENCOUNTER — Ambulatory Visit: Payer: Medicare HMO | Admitting: Neurology

## 2018-01-25 VITALS — BP 126/76 | HR 51 | Ht 74.5 in | Wt 242.0 lb

## 2018-01-25 DIAGNOSIS — I480 Paroxysmal atrial fibrillation: Secondary | ICD-10-CM

## 2018-01-25 DIAGNOSIS — E785 Hyperlipidemia, unspecified: Secondary | ICD-10-CM

## 2018-01-25 DIAGNOSIS — I639 Cerebral infarction, unspecified: Secondary | ICD-10-CM

## 2018-01-25 DIAGNOSIS — I10 Essential (primary) hypertension: Secondary | ICD-10-CM

## 2018-01-25 NOTE — Patient Instructions (Signed)
From my standpoint, it is okay to work part time.  Follow up in February as already scheduled.

## 2018-01-29 ENCOUNTER — Other Ambulatory Visit: Payer: Medicare HMO

## 2018-02-05 DIAGNOSIS — Z7901 Long term (current) use of anticoagulants: Secondary | ICD-10-CM | POA: Diagnosis not present

## 2018-02-05 DIAGNOSIS — F419 Anxiety disorder, unspecified: Secondary | ICD-10-CM | POA: Diagnosis not present

## 2018-02-05 DIAGNOSIS — G4733 Obstructive sleep apnea (adult) (pediatric): Secondary | ICD-10-CM | POA: Diagnosis not present

## 2018-02-05 DIAGNOSIS — Z8673 Personal history of transient ischemic attack (TIA), and cerebral infarction without residual deficits: Secondary | ICD-10-CM | POA: Diagnosis not present

## 2018-02-05 DIAGNOSIS — I10 Essential (primary) hypertension: Secondary | ICD-10-CM | POA: Diagnosis not present

## 2018-02-05 DIAGNOSIS — I4891 Unspecified atrial fibrillation: Secondary | ICD-10-CM | POA: Diagnosis not present

## 2018-02-16 ENCOUNTER — Encounter (HOSPITAL_COMMUNITY): Payer: Self-pay

## 2018-02-16 ENCOUNTER — Ambulatory Visit (HOSPITAL_COMMUNITY): Admission: RE | Admit: 2018-02-16 | Payer: Medicare HMO | Source: Home / Self Care | Admitting: Internal Medicine

## 2018-02-16 ENCOUNTER — Encounter (HOSPITAL_COMMUNITY): Admission: RE | Payer: Self-pay | Source: Home / Self Care

## 2018-02-16 SURGERY — A-FLUTTER ABLATION
Anesthesia: General

## 2018-03-08 ENCOUNTER — Emergency Department (HOSPITAL_COMMUNITY)
Admission: EM | Admit: 2018-03-08 | Discharge: 2018-03-08 | Disposition: A | Discharge status: Home / Self Care | Payer: Medicare Other | Attending: Emergency Medicine | Admitting: Emergency Medicine

## 2018-03-08 ENCOUNTER — Other Ambulatory Visit: Payer: Self-pay

## 2018-03-08 ENCOUNTER — Encounter (HOSPITAL_COMMUNITY): Payer: Self-pay

## 2018-03-08 DIAGNOSIS — E7800 Pure hypercholesterolemia, unspecified: Secondary | ICD-10-CM | POA: Insufficient documentation

## 2018-03-08 DIAGNOSIS — I509 Heart failure, unspecified: Secondary | ICD-10-CM | POA: Insufficient documentation

## 2018-03-08 DIAGNOSIS — I130 Hypertensive heart and chronic kidney disease with heart failure and stage 1 through stage 4 chronic kidney disease, or unspecified chronic kidney disease: Secondary | ICD-10-CM | POA: Insufficient documentation

## 2018-03-08 DIAGNOSIS — J029 Acute pharyngitis, unspecified: Secondary | ICD-10-CM | POA: Insufficient documentation

## 2018-03-08 DIAGNOSIS — Z7901 Long term (current) use of anticoagulants: Secondary | ICD-10-CM | POA: Diagnosis not present

## 2018-03-08 DIAGNOSIS — Z79899 Other long term (current) drug therapy: Secondary | ICD-10-CM | POA: Diagnosis not present

## 2018-03-08 DIAGNOSIS — N183 Chronic kidney disease, stage 3 (moderate): Secondary | ICD-10-CM | POA: Diagnosis not present

## 2018-03-08 DIAGNOSIS — Z8673 Personal history of transient ischemic attack (TIA), and cerebral infarction without residual deficits: Secondary | ICD-10-CM | POA: Diagnosis not present

## 2018-03-08 DIAGNOSIS — E782 Mixed hyperlipidemia: Secondary | ICD-10-CM | POA: Insufficient documentation

## 2018-03-08 DIAGNOSIS — I4891 Unspecified atrial fibrillation: Secondary | ICD-10-CM | POA: Insufficient documentation

## 2018-03-08 NOTE — ED Triage Notes (Signed)
Pt to ER with c/o sore throat and drainage in throat with pink tinge. Pt states he uses CPAP at night.

## 2018-03-08 NOTE — Discharge Instructions (Addendum)
Please read attached information. If you experience any new or worsening signs or symptoms please return to the emergency room for evaluation. Please follow-up with your primary care provider or specialist as discussed.  °

## 2018-03-08 NOTE — ED Notes (Signed)
Pt discharged from ED; instructions provided; Pt encouraged to return to ED if symptoms worsen and to f/u with PCP; Pt verbalized understanding of all instructions 

## 2018-03-08 NOTE — ED Provider Notes (Signed)
[facility] EMERGENCY DEPARTMENT Provider Note   CSN: [id number] Arrival date & time: 03/08/18  0755     History   Chief Complaint Chief Complaint  Patient presents with  . Sore Throat    HPI Darrell Perkins is a 68 y.o. male.  HPI   68 year old male presents today with complaints of mucus in his throat.  Patient notes that he woke up this morning and felt as if he had mucus in his throat.  He was trying to cough to expel the mucus and noted a pink tinge in his mucus.  He denies any productive cough fever sore throat painful swallowing, shortness of breath, pain in the neck, or any other respiratory symptoms.  Patient notes that he has had no dark or tarry stools, no abnormal bruising or bleeding elsewhere.  He otherwise feels well and is at his baseline.  Patient additionally notes a history of sleep apnea wearing a CPAP.  Patient denies any night sweats, weight loss, or history of cancer.  Patient is a non-smoker.  Past Medical History:  Diagnosis Date  . Bronchitis    hx of   . CHF (congestive heart failure) (HCC)    EF 45%  . 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 GERD (gastroesophageal reflux disease)   . Hypertensive cardiovascular disease   . Kidney stones   . Sleep apnea    USES CPAP  . Typical atrial flutter (HCC) 10/2016    Patient Active Problem List   Diagnosis Date Noted  . Atrial flutter (HCC)   . Cerebrovascular accident (CVA) (HCC) 12/17/2017  . Chronic pain of left knee 03/06/2017  . Unilateral primary osteoarthritis, left knee 03/06/2017  . Leukopenia 11/03/2016  . Atrial fibrillation with RVR (HCC) 11/02/2016  . Allergic rhinitis 11/26/2014  . Cutaneous eruption 11/26/2014  . Uncomplicated asthma 11/26/2014  . Asthma, extrinsic, without status asthmaticus 11/26/2014  . Nephrolithiasis 11/26/2014  . Cellulitis and abscess of leg 11/26/2014  . ED (erectile dysfunction) of organic origin 11/26/2014  . Benign essential HTN  11/26/2014  . Peripheral neuralgia 11/26/2014  . Cephalalgia 11/26/2014  . Tinea cruris 11/26/2014  . Folliculitis 11/26/2014  . Hypercholesterolemia without hypertriglyceridemia 11/26/2014  . Hematuria, microscopic 11/26/2014  . H/O adenomatous polyp of colon 11/26/2014  . Snoring 11/26/2014  . Difficulty staying awake 11/26/2014  . Essential hypertension 11/26/2014  . Combined fat and carbohydrate induced hyperlipemia 11/26/2014  . Tinea pedis 11/26/2014  . Erectile dysfunction due to arterial insufficiency 11/26/2014  . Perifolliculitis capitis abscedens 11/26/2014  . Other constipation 11/26/2014  . Chest pain 11/26/2014  . Other fatigue 11/26/2014  . Pain of right leg 11/26/2014  . Lesion of lateral popliteal nerve 11/26/2014  . Chronic kidney disease, stage III (moderate) (HCC) 11/26/2014  . 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 Athlete's foot 11/26/2014  . Mixed hyperlipidemia 11/26/2014  . Adiposity 11/26/2014  . Anticoagulated 11/26/2014  . 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 Chest pain syndrome 11/26/2014  . Rapid atrial fibrillation (HCC) 11/26/2014  . OSA on CPAP 11/26/2014  . Encounter for general adult medical examination without abnormal findings 09/02/2014    Past Surgical History:  Procedure Laterality Date  . colonscopy     . CYSTOSCOPY WITH RETROGRADE PYELOGRAM, URETEROSCOPY AND STENT PLACEMENT Right 08/01/2014   Procedure: CYSTOSCOPY WITH RIGHT  RETROGRADE PYELOGRAM/RIGHT URETEROSCOPY HOLMIUMN LASER LITOTRIPSY  STENT PLACEMENT ;  Surgeon: Jerilee Field, MD;  Location: [facility];  Service: Urology;  Laterality: Right;  .  URETEROSCOPY WITH HOLMIUM LASER LITHOTRIPSY Right 11/18/2016   Procedure: URETEROSCOPY WITH HOLMIUM LASER LITHOTRIPSY/STENT PLACEMENT;  Surgeon: Jerilee FieldEskridge, Matthew, MD;  Location: [facility];  Service: Urology;  Laterality: Right;        Home Medications    Prior to Admission medications   Medication Sig Start Date End Date  Taking? Authorizing Provider  acetaminophen (TYLENOL) 650 MG CR tablet Take 650 mg by mouth 2 (two) times daily as needed for pain.    [provider]  albuterol (PROVENTIL HFA;VENTOLIN HFA) 108 (90 BASE) MCG/ACT inhaler Inhale 1-2 puffs into the lungs every 6 (six) hours as needed for wheezing. 04/14/12   Sunnie Nielsenpitz, Brian, MD  apixaban (ELIQUIS) 5 MG TABS tablet Take 1 tablet (5 mg total) by mouth every 12 (twelve) hours. Additional refills by PCP, Neurology, or Cardiology Patient taking differently: Take 5 mg by mouth every 12 (twelve) hours.  12/18/17 01/17/18  Jerald Kiefhiu, Stephen K, MD  Bee Pollen 580 MG CAPS Take 1,160 mg by mouth daily.     [provider]  budesonide-formoterol (SYMBICORT) 160-4.5 MCG/ACT inhaler Inhale 2 puffs into the lungs every other day.     [provider]  Cholecalciferol (VITAMIN D) 2000 units tablet Take 2,000 Units by mouth daily.    [provider]  ezetimibe (ZETIA) 10 MG tablet Take 10 mg by mouth daily. 11/25/17   [provider]  fluticasone (FLONASE) 50 MCG/ACT nasal spray Place 1 spray into both nostrils daily. 12/07/17   [provider]  hydrOXYzine (ATARAX/VISTARIL) 25 MG tablet Take 1 tablet (25 mg total) by mouth every 6 (six) hours. 12/19/17   Liberty HandyGibbons, Claudia J, PA-C  losartan (COZAAR) 100 MG tablet Take 100 mg by mouth daily. 09/18/16   [provider]  meloxicam (MOBIC) 15 MG tablet Take 15 mg by mouth daily as needed for pain.  09/01/16   [provider]    Family History Family History  Problem Relation Age of Onset  . Hypertension Mother   . Diabetes Father   . Cerebrovascular Disease Father   . Alcohol abuse Father   . Hypertension Father   . COPD Father     Social History Social History   Tobacco Use  . Smoking status: Never Smoker  . Smokeless tobacco: Never Used  Substance Use Topics  . Alcohol use: No  . Drug use: No     Allergies   Allopurinol; Aspirin; Prednisone;  Rosuvastatin; Simvastatin; and Tuberculin tests   Review of Systems Review of Systems  All other systems reviewed and are negative.    Physical Exam Updated Vital Signs BP (!) 159/87 (BP Location: Right Arm)   Pulse 92   Temp 98.4 F (36.9 C) (Oral)   Resp 18   SpO2 98%   Physical Exam Vitals signs and nursing note reviewed.  Constitutional:      Appearance: He is well-developed.  HENT:     Head: Normocephalic and atraumatic.     Comments: Oropharynx is clear with no swelling, erythema, edema or exudate, uvula is midline and rises with phonation, no bleeding noted, no lesions-neck is supple full active range of motion no lymphadenopathy noted Eyes:     General: No scleral icterus.       Right eye: No discharge.        Left eye: No discharge.     Conjunctiva/sclera: Conjunctivae normal.     Pupils: Pupils are equal, round, and reactive to light.  Neck:     Musculoskeletal: Normal  range of motion.     Vascular: No JVD.     Trachea: No tracheal deviation.  Pulmonary:     Effort: Pulmonary effort is normal.     Breath sounds: No stridor.     Comments: Lung sounds clear throughout with no adventitious lung sounds Skin:    Comments: No bruising noted  Neurological:     Mental Status: He is alert and oriented to person, place, and time.     Coordination: Coordination normal.  Psychiatric:        Behavior: Behavior normal.        Thought Content: Thought content normal.        Judgment: Judgment normal.      ED Treatments / Results  Labs (all labs ordered are listed, but only abnormal results are displayed) Labs Reviewed - No data to display  EKG None  Radiology No results found.  Procedures Procedures (including critical care time)  Medications Ordered in ED Medications - No data to display   Initial Impression / Assessment and Plan / ED Course  I have reviewed the triage vital signs and the nursing notes.  Pertinent labs & imaging results that were  available during my care of the patient were reviewed by me and considered in my medical decision making (see chart for details).     68 year old male presents today with throat irritation.  He has no significant pain in his throat only mucus and some tinge with his mucus.  He has no signs of acute bleed he is on Eliquis.  He is very well-appearing in no acute distress.  I have very low suspicion for any infectious etiology.  Patient does wear CPAP at night which could be contributing to the irritation.  Patient encouraged to increase humidity in the house, warm tea, monitor for signs of infection or bleeding return immediately if they present.  He will follow-up with his primary care if they persist.  Final Clinical Impressions(s) / ED Diagnoses   Final diagnoses:  Pharyngitis, unspecified etiology    ED Discharge Orders    None       Rosalio Loud 03/08/18 0836    Donnetta Hutching, MD 03/08/18 [phone]

## 2018-03-12 ENCOUNTER — Ambulatory Visit: Payer: Medicare HMO | Admitting: Cardiovascular Disease

## 2018-03-19 ENCOUNTER — Encounter: Payer: Self-pay | Admitting: Cardiovascular Disease

## 2018-03-21 ENCOUNTER — Telehealth: Payer: Self-pay

## 2018-03-21 ENCOUNTER — Ambulatory Visit: Payer: Medicare HMO | Admitting: Internal Medicine

## 2018-03-21 NOTE — Telephone Encounter (Signed)
Error

## 2018-04-17 ENCOUNTER — Ambulatory Visit (INDEPENDENT_AMBULATORY_CARE_PROVIDER_SITE_OTHER): Payer: Medicare Other | Admitting: Adult Health

## 2018-04-17 ENCOUNTER — Encounter: Payer: Self-pay | Admitting: Adult Health

## 2018-04-17 VITALS — BP 140/64 | HR 59 | Ht 74.0 in | Wt 258.8 lb

## 2018-04-17 DIAGNOSIS — I4892 Unspecified atrial flutter: Secondary | ICD-10-CM | POA: Diagnosis not present

## 2018-04-17 DIAGNOSIS — I10 Essential (primary) hypertension: Secondary | ICD-10-CM | POA: Diagnosis not present

## 2018-04-17 DIAGNOSIS — I6381 Other cerebral infarction due to occlusion or stenosis of small artery: Secondary | ICD-10-CM

## 2018-04-17 NOTE — Progress Notes (Signed)
Unless he has an ablation that is proven to have durable success with an ILR, he should remain on lifelong anticoagulation.  Pradaxa to go generic this year, may soon become a cheaper good alternative. MCr

## 2018-04-17 NOTE — Patient Instructions (Signed)
Follow-Up: You will need a follow up appointment in 6 months.  Please call our office 2 months  (June 2020) in advance to schedule this (AUGUST 2020)  appointment.  You may see Thurmon Fair, MD Joni Reining, DNP, AACC or one of the following Advanced Practice Providers on your designated Care Team:  Azalee Course, PA-C  Micah Flesher, [place]         WE WILL CALL YOU ABOUT ABLATION SCHEDULING  Medication Instructions:  NO CHANGES- Your physician recommends that you continue on your current medications as directed. Please refer to the Current Medication list given to you today. If you need a refill on your cardiac medications before your next appointment, please call your pharmacy. Labwork: When you have labs (blood work) and your tests are completely normal, you will receive your results ONLY by MyChart Message (if you have MyChart) -OR- A paper copy in the mail.  At [facility], you and your health needs are our priority.  As part of our continuing mission to provide you with exceptional heart care, we have created designated Provider Care Teams.  These Care Teams include your primary Cardiologist (physician) and Advanced Practice Providers (APPs -  Physician Assistants and Nurse Practitioners) who all work together to provide you with the care you need, when you need it.  Thank you for choosing [facility] at [facility]!!

## 2018-04-17 NOTE — Progress Notes (Signed)
Cardiology Office Note   Date:  04/17/2018   ID:  Darrell Perkins, DOB 11/29/1950, MRN [id number]  PCP:  Marden NobleGates, Robert, MD  Cardiologist:  Dr.Croitoru  Chief Complaint  Patient presents with  . Follow-up    ED F/U  . Atrial Fibrillation  . Hypertension     History of Present Illness: Darrell Perkins is a 68 y.o. male who presents for ongoing assessment and management of of atrial flutter, HTN, with other history to include OSA on CPAP, CVA (posterior temporal/parietal white matter infarct), CKD Stage III, and nephrolithiasis. Marland Kitchen. He is not on anticoagulation due to hematuria. Due to CVA, however, he was placed on Eliquis -CHADS VASC Score of 5. He was to see Dr. Clide CliffKline for discussion of possible ablation. He was evaluated by Dr Graciela HusbandsKlein who advised anticoagulation with subsequent atrial flutter ablation followed by ILR to monitor for asymptomatic atrial fibrillation.    Dr.Allred saw him on 01/15/2018   "I think it may be reasonable to proceed with atrial flutter ablation, with plans also to continue long term anticoagulation and to also monitor for afib going forward with an ILR as advised by Dr Graciela HusbandsKlein."  This was discussed with patient.   He did not have the ablation as he remained in NSR. As such, ILR was not planned. He continues on apixaban. He is medically compliant. No complaints of irregular HR, palpitations or symptoms of chest pain. He goes to to the gym 5 nights a week and rides a stationary bike for 40 minutes. "I feel great!"   Past Medical History:  Diagnosis Date  . Bronchitis    hx of   . CHF (congestive heart failure) (HCC)    EF 45%  . 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 GERD (gastroesophageal reflux disease)   . Hypertensive cardiovascular disease   . Kidney stones   . Sleep apnea    USES CPAP  . Typical atrial flutter (HCC) 10/2016    Past Surgical History:  Procedure Laterality Date  . colonscopy     . CYSTOSCOPY WITH RETROGRADE PYELOGRAM, URETEROSCOPY AND  STENT PLACEMENT Right 08/01/2014   Procedure: CYSTOSCOPY WITH RIGHT  RETROGRADE PYELOGRAM/RIGHT URETEROSCOPY HOLMIUMN LASER LITOTRIPSY  STENT PLACEMENT ;  Surgeon: Jerilee FieldMatthew Eskridge, MD;  Location: [facility];  Service: Urology;  Laterality: Right;  . URETEROSCOPY WITH HOLMIUM LASER LITHOTRIPSY Right 11/18/2016   Procedure: URETEROSCOPY WITH HOLMIUM LASER LITHOTRIPSY/STENT PLACEMENT;  Surgeon: Jerilee FieldEskridge, Matthew, MD;  Location: [facility];  Service: Urology;  Laterality: Right;     Current Outpatient Medications  Medication Sig Dispense Refill  . acetaminophen (TYLENOL) 650 MG CR tablet Take 650 mg by mouth 2 (two) times daily as needed for pain.    Marland Kitchen. albuterol (PROVENTIL HFA;VENTOLIN HFA) 108 (90 BASE) MCG/ACT inhaler Inhale 1-2 puffs into the lungs every 6 (six) hours as needed for wheezing. 1 Inhaler 0  . Bee Pollen 580 MG CAPS Take 1,160 mg by mouth daily.     . budesonide-formoterol (SYMBICORT) 160-4.5 MCG/ACT inhaler Inhale 2 puffs into the lungs every other day.     . Cholecalciferol (VITAMIN D) 2000 units tablet Take 2,000 Units by mouth daily.    Marland Kitchen. ezetimibe (ZETIA) 10 MG tablet Take 10 mg by mouth daily.  3  . fluticasone (FLONASE) 50 MCG/ACT nasal spray Place 1 spray into both nostrils daily.  2  . hydrOXYzine (ATARAX/VISTARIL) 25 MG tablet Take 1 tablet (25 mg total) by mouth every 6 (six) hours. 12 tablet 0  .  losartan (COZAAR) 100 MG tablet Take 100 mg by mouth daily.  3  . meloxicam (MOBIC) 15 MG tablet Take 15 mg by mouth daily as needed for pain.   1  . apixaban (ELIQUIS) 5 MG TABS tablet Take 1 tablet (5 mg total) by mouth every 12 (twelve) hours. Additional refills by PCP, Neurology, or Cardiology (Patient taking differently: Take 5 mg by mouth every 12 (twelve) hours. ) 60 tablet 0   No current facility-administered medications for this visit.     Allergies:   Allopurinol; Aspirin; Prednisone; Rosuvastatin; Simvastatin; and Tuberculin tests    Social History:  The patient  reports  that he has never smoked. He has never used smokeless tobacco. He reports that he does not drink alcohol or use drugs.   Family History:  The patient's family history includes Alcohol abuse in his father; COPD in his father; Cerebrovascular Disease in his father; Diabetes in his father; Hypertension in his father and mother.    ROS: All other systems are reviewed and negative. Unless otherwise mentioned in H&P    PHYSICAL EXAM: VS:  BP 140/64 (BP Location: Left Arm, Patient Position: Sitting, Cuff Size: Large)   Pulse (!) 59   Ht 6\' 2"  (1.88 m)   Wt 258 lb 12.8 oz (117.4 kg)   BMI 33.23 kg/m  , BMI Body mass index is 33.23 kg/m. GEN: Well nourished, well developed, in no acute distress HEENT: normal Neck: no JVD, carotid bruits, or masses Cardiac: RRR; no murmurs, rubs, or gallops,no edema  Respiratory:  Clear to auscultation bilaterally, normal work of breathing GI: soft, nontender, nondistended, + BS MS: no deformity or atrophy Skin: warm and dry, no rash Neuro:  Strength and sensation are intact Psych: euthymic mood, full affect   EKG:  NSR rate of 59 bpm.   Recent Labs: 12/19/2017: ALT 16; BUN 22; Creatinine, Ser 2.07; Hemoglobin 12.7; Platelets 286; Potassium 4.2; Sodium 134    Lipid Panel    Component Value Date/Time   CHOL 181 12/18/2017 0339   TRIG 119 12/18/2017 0339   HDL 30 (L) 12/18/2017 0339   CHOLHDL 6.0 12/18/2017 0339   VLDL 24 12/18/2017 0339   LDLCALC 127 (H) 12/18/2017 0339      Wt Readings from Last 3 Encounters:  04/17/18 258 lb 12.8 oz (117.4 kg)  01/25/18 242 lb (109.8 kg)  01/15/18 236 lb 3.2 oz (107.1 kg)      Other studies Reviewed: Echo 12/15/2017 Left ventricle: The cavity size was normal. Wall thickness was   increased in a pattern of mild LVH. Systolic function was normal.   The estimated ejection fraction was in the range of 55% to 60%.   Wall motion was normal; there were no regional wall motion   abnormalities. Doppler  parameters are consistent with abnormal   left ventricular relaxation (grade 1 diastolic dysfunction). - Normal LV systolic function; mild LVH; mild diastolic   dysfunction.  ASSESSMENT AND PLAN:  1. Paroxysmal Atrial flutter:  Currently in NSR. He remains on Eliquis. He did not have ablation. The patient reports it was cancelled because he was in NSR. Therefore ILR was not placed either.. I did not see documentation about why it was cancelled. He will remain on Eliquis for now.    I will discuss need to move forward with ILR with Dr. Royann Shivers as the patient wants to stop Eliquis.  The patient is willing to have ILR placed if it is necessary. With hx of CVA, I  have explained that he will need to stay on Eliquis long term for now. CHADS VASC score of 5.   2. Hypertension: BP is slightly elevated today. Will keep an eye on this and titrate medication if necessary on follow up. Continue losartan.   3.  Hx of CVA: No residual focal weakness.   4. OSA: Compliant with CPAP.  Current medicines are reviewed at length with the patient today.    Labs/ tests ordered today include: None  Bettey MareKathryn M. Liborio NixonLawrence DNP, ANP, AACC   04/17/2018 4:09 PM    [facility] [address] Office [phone] Fax [phone]

## 2018-04-18 NOTE — Progress Notes (Signed)
Thank you for being available to answer my text about him.  He will see you next on a previously scheduled appointment.   KL

## 2018-04-30 NOTE — Progress Notes (Signed)
NEUROLOGY FOLLOW UP OFFICE NOTE  GEORGIO GO [id number]  HISTORY OF PRESENT ILLNESS: Darrell Perkins is a 68 year old right-handed African-American man with paroxysmal atrial fibrillation, congestive heart failure, CKD stage III, hypertension, and sleep apnea who follows up for stroke.  UPDATE: He is currently on Eliquis and Zetia. He feels well.  He denies difficulty with performing ADLs.   He denies anxiety or depression at this time.  His mood is good.  He goes to the gym 4 or 5 times a week.  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  He retired but now works part time twice a week.  HISTORY: He was admitted to [facility] from 12/17/17 to 12/18/17 for stroke. About 2 weeks prior to presentation, he started experiencing headache, neck and back pain and difficulty focusing. He is a Optician, dispensing at [employer] and was having difficulty reading his lessons.His PCP prescribed him antibiotics for presumed sinusitis. When symptoms didn't subside, a head CT without contrast was performed on 12/12/2017, which demonstrated chronic small vessel ischemic changes, includingprior right posterior temporal/parietal/occipital junctional infarctandinfarctdjacent to therightcentrum semiovale. He was placed on Plavix.Due to persistent confusion, not feeling right, and feeling heart flutter, he returned to the hospital on day of admission. MRI of the brain revealed right posterior temporal/parietal infarct. MRA of head revealed no large vessel occlusion or stenosis. Carotid Doppler revealed 1-39% bilateral ICA stenosis with antegrade flow of both vertebral arteries. 2D echocardiogram demonstrated ejection fraction of 55-60% with no source of embolus. LDL was 127. Hemoglobin A1c was 6.6. Due to history of atrial fibrillation, Plavix was stopped and he was started on Eliquis for secondary stroke prevention. He was discharged on Zetia.  PAST MEDICAL HISTORY: Past Medical History:  Diagnosis  Date  . Bronchitis    hx of   . CHF (congestive heart failure) (HCC)    EF 45%  . 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 GERD (gastroesophageal reflux disease)   . Hypertensive cardiovascular disease   . Kidney stones   . Sleep apnea    USES CPAP  . Typical atrial flutter (HCC) 10/2016    MEDICATIONS: Current Outpatient Medications on File Prior to Visit  Medication Sig Dispense Refill  . acetaminophen (TYLENOL) 650 MG CR tablet Take 650 mg by mouth 2 (two) times daily as needed for pain.    Marland Kitchen albuterol (PROVENTIL HFA;VENTOLIN HFA) 108 (90 BASE) MCG/ACT inhaler Inhale 1-2 puffs into the lungs every 6 (six) hours as needed for wheezing. 1 Inhaler 0  . apixaban (ELIQUIS) 5 MG TABS tablet Take 1 tablet (5 mg total) by mouth every 12 (twelve) hours. Additional refills by PCP, Neurology, or Cardiology (Patient taking differently: Take 5 mg by mouth every 12 (twelve) hours. ) 60 tablet 0  . Bee Pollen 580 MG CAPS Take 1,160 mg by mouth daily.     . budesonide-formoterol (SYMBICORT) 160-4.5 MCG/ACT inhaler Inhale 2 puffs into the lungs every other day.     . Cholecalciferol (VITAMIN D) 2000 units tablet Take 2,000 Units by mouth daily.    Marland Kitchen ezetimibe (ZETIA) 10 MG tablet Take 10 mg by mouth daily.  3  . fluticasone (FLONASE) 50 MCG/ACT nasal spray Place 1 spray into both nostrils daily.  2  . hydrOXYzine (ATARAX/VISTARIL) 25 MG tablet Take 1 tablet (25 mg total) by mouth every 6 (six) hours. 12 tablet 0  . losartan (COZAAR) 100 MG tablet Take 100 mg by mouth daily.  3  .  meloxicam (MOBIC) 15 MG tablet Take 15 mg by mouth daily as needed for pain.   1   No current facility-administered medications on file prior to visit.     ALLERGIES: Allergies  Allergen Reactions  . Allopurinol Other (See Comments)    "Upset stomach"  . Aspirin Other (See Comments)    Upset stomach  . Prednisone Other (See Comments)    "Irritable"  . Rosuvastatin Other (See Comments)    Mental disturbance   . Simvastatin Other (See Comments)    Tired and achy  . Tuberculin Tests Other (See Comments)    Intense local reaction    FAMILY HISTORY: Family History  Problem Relation Age of Onset  . Hypertension Mother   . Diabetes Father   . Cerebrovascular Disease Father   . Alcohol abuse Father   . Hypertension Father   . COPD Father     SOCIAL HISTORY: Social History   Socioeconomic History  . Marital status: Married    Spouse name: Not on file  . Number of children: Not on file  . Years of education: Not on file  . Highest education level: Not on file  Occupational History  . Not on file  Social Needs  . Financial resource strain: Not on file  . Food insecurity:    Worry: Not on file    Inability: Not on file  . Transportation needs:    Medical: Not on file    Non-medical: Not on file  Tobacco Use  . Smoking status: Never Smoker  . Smokeless tobacco: Never Used  Substance and Sexual Activity  . Alcohol use: No  . Drug use: No  . Sexual activity: Not on file  Lifestyle  . Physical activity:    Days per week: Not on file    Minutes per session: Not on file  . Stress: Not on file  Relationships  . Social connections:    Talks on phone: Not on file    Gets together: Not on file    Attends religious service: Not on file    Active member of club or organization: Not on file    Attends meetings of clubs or organizations: Not on file    Relationship status: Not on file  . Intimate partner violence:    Fear of current or ex partner: Not on file    Emotionally abused: Not on file    Physically abused: Not on file    Forced sexual activity: Not on file  Other Topics Concern  . Not on file  Social History Narrative   Pt is right handed   Pt lives in single story home with his wife and grandson   Has 3 children   12th grade education   Retired from [employer] - [employer]s.     REVIEW OF SYSTEMS: Constitutional: No fevers, chills, or sweats, no generalized  fatigue, change in appetite Eyes: No visual changes, double vision, eye pain Ear, nose and throat: No hearing loss, ear pain, nasal congestion, sore throat Cardiovascular: No chest pain, palpitations Respiratory:  No shortness of breath at rest or with exertion, wheezes GastrointestinaI: No nausea, vomiting, diarrhea, abdominal pain, fecal incontinence Genitourinary:  No dysuria, urinary retention or frequency Musculoskeletal:  No neck pain, back pain Integumentary: No rash, pruritus, skin lesions Neurological: as above Psychiatric: No depression, insomnia, anxiety Endocrine: No palpitations, fatigue, diaphoresis, mood swings, change in appetite, change in weight, increased thirst Hematologic/Lymphatic:  No purpura, petechiae. Allergic/Immunologic: no itchy/runny eyes, nasal  congestion, recent allergic reactions, rashes  PHYSICAL EXAM: Blood pressure (!) 142/76, pulse 63, height 6' 2.5" (1.892 m), weight 252 lb (114.3 kg), SpO2 98 %.  Head:  Normocephalic/atraumatic Eyes:  Fundi examined but not visualized Neck: supple, no paraspinal tenderness, full range of motion Heart:  Regular rate and rhythm Lungs:  Clear to auscultation bilaterally Back: No paraspinal tenderness Neurological Exam: alert and oriented to person, place, and time. Attention span and concentration intact, recent and remote memory intact, fund of knowledge intact.  Speech fluent and not dysarthric, language intact.  CN II-XII intact. Bulk and tone normal, muscle strength 5/5 throughout.  Sensation to light touch, temperature and vibration intact.  Deep tendon reflexes 2+ throughout, toes downgoing.  Finger to nose and heel to shin testing intact.  Gait normal, Romberg negative.  IMPRESSION: 1.  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  Atrial fibrillation 3.  Hypertension 4.  Hyperlipidemia  PLAN: Management as per PCP and cardiology: 1.  Eliquis for secondary stroke  prevention 2.  Zetia.  LDL goal should be less than 70 3.  Follow up with PCP to optimize blood pressure control 4.  Continue exercise 5.  Mediterranean diet 6.  Follow up in 8 months.  17 minutes spent face to face with patient, over 50% spent discussing management.  Shon Millet, DO  CC: [name], MD

## 2018-05-01 ENCOUNTER — Encounter: Payer: Self-pay | Admitting: Neurology

## 2018-05-01 ENCOUNTER — Ambulatory Visit: Payer: Medicare Other | Admitting: Neurology

## 2018-05-01 VITALS — BP 142/76 | HR 63 | Ht 74.5 in | Wt 252.0 lb

## 2018-05-01 DIAGNOSIS — I10 Essential (primary) hypertension: Secondary | ICD-10-CM

## 2018-05-01 DIAGNOSIS — E785 Hyperlipidemia, unspecified: Secondary | ICD-10-CM | POA: Diagnosis not present

## 2018-05-01 DIAGNOSIS — I639 Cerebral infarction, unspecified: Secondary | ICD-10-CM

## 2018-05-01 DIAGNOSIS — I480 Paroxysmal atrial fibrillation: Secondary | ICD-10-CM | POA: Diagnosis not present

## 2018-05-01 NOTE — Patient Instructions (Signed)
1.  Continue Eliquis 2.  Continue Zetia 3.  Follow up with [name] regarding blood pressure 4.  Continue exercise 5.  Mediterranean diet (see below) 6.  Follow up in 8 months   Mediterranean Diet A Mediterranean diet refers to food and lifestyle choices that are based on the traditions of countries located on the [place]. This way of eating has been shown to help prevent certain conditions and improve outcomes for people who have chronic diseases, like kidney disease and heart disease. What are tips for following this plan? Lifestyle  Cook and eat meals together with your family, when possible.  Drink enough fluid to keep your urine clear or pale yellow.  Be physically active every day. This includes: ? Aerobic exercise like running or swimming. ? Leisure activities like gardening, walking, or housework.  Get 7-8 hours of sleep each night.  If recommended by your health care provider, drink red wine in moderation. This means 1 glass a day for nonpregnant women and 2 glasses a day for men. A glass of wine equals 5 oz (150 mL). Reading food labels   Check the serving size of packaged foods. For foods such as rice and pasta, the serving size refers to the amount of cooked product, not dry.  Check the total fat in packaged foods. Avoid foods that have saturated fat or trans fats.  Check the ingredients list for added sugars, such as corn syrup. Shopping  At the grocery store, buy most of your food from the areas near the walls of the store. This includes: ? Fresh fruits and vegetables (produce). ? Grains, beans, nuts, and seeds. Some of these may be available in unpackaged forms or large amounts (in bulk). ? Fresh seafood. ? Poultry and eggs. ? Low-fat dairy products.  Buy whole ingredients instead of prepackaged foods.  Buy fresh fruits and vegetables in-season from local farmers markets.  Buy frozen fruits and vegetables in resealable bags.  If you do not have  access to quality fresh seafood, buy precooked frozen shrimp or canned fish, such as tuna, salmon, or sardines.  Buy small amounts of raw or cooked vegetables, salads, or olives from the deli or salad bar at your store.  Stock your pantry so you always have certain foods on hand, such as olive oil, canned tuna, canned tomatoes, rice, pasta, and beans. Cooking  Cook foods with extra-virgin olive oil instead of using butter or other vegetable oils.  Have meat as a side dish, and have vegetables or grains as your main dish. This means having meat in small portions or adding small amounts of meat to foods like pasta or stew.  Use beans or vegetables instead of meat in common dishes like chili or lasagna.  Experiment with different cooking methods. Try roasting or broiling vegetables instead of steaming or sauteing them.  Add frozen vegetables to soups, stews, pasta, or rice.  Add nuts or seeds for added healthy fat at each meal. You can add these to yogurt, salads, or vegetable dishes.  Marinate fish or vegetables using olive oil, lemon juice, garlic, and fresh herbs. Meal planning   Plan to eat 1 vegetarian meal one day each week. Try to work up to 2 vegetarian meals, if possible.  Eat seafood 2 or more times a week.  Have healthy snacks readily available, such as: ? Vegetable sticks with hummus. ? [place] yogurt. ? Fruit and nut trail mix.  Eat balanced meals throughout the week. This includes: ? Fruit:  2-3 servings a day ? Vegetables: 4-5 servings a day ? Low-fat dairy: 2 servings a day ? Fish, poultry, or lean meat: 1 serving a day ? Beans and legumes: 2 or more servings a week ? Nuts and seeds: 1-2 servings a day ? Whole grains: 6-8 servings a day ? Extra-virgin olive oil: 3-4 servings a day  Limit red meat and sweets to only a few servings a month What are my food choices?  Mediterranean diet ? Recommended ? Grains: Whole-grain pasta. Brown rice. Bulgar wheat. Polenta.  Couscous. Whole-wheat bread. [place]. ? Vegetables: Artichokes. Beets. Broccoli. Cabbage. Carrots. Eggplant. Green beans. Chard. Kale. Spinach. Onions. Leeks. Peas. Squash. Tomatoes. Peppers. Radishes. ? Fruits: Apples. Apricots. Avocado. Berries. Bananas. Cherries. Dates. Figs. Grapes. Lemons. Melon. Oranges. Peaches. Plums. Pomegranate. ? Meats and other protein foods: Beans. Almonds. Sunflower seeds. Pine nuts. Peanuts. [place]. Salmon. Scallops. Shrimp. [place]. Tilapia. Clams. Oysters. Eggs. ? Dairy: Low-fat milk. Cheese. Greek yogurt. ? Beverages: Water. Red wine. Herbal tea. ? Fats and oils: Extra virgin olive oil. Avocado oil. Grape seed oil. ? Sweets and desserts: [place] yogurt with honey. Baked apples. Poached pears. Trail mix. ? Seasoning and other foods: Basil. Cilantro. Coriander. Cumin. Mint. Parsley. Sage. Rosemary. Tarragon. Garlic. Oregano. Thyme. Pepper. Balsalmic vinegar. Tahini. Hummus. Tomato sauce. Olives. Mushrooms. ? Limit these ? Grains: Prepackaged pasta or rice dishes. Prepackaged cereal with added sugar. ? Vegetables: Deep fried potatoes (french fries). ? Fruits: Fruit canned in syrup. ? Meats and other protein foods: Beef. Pork. Lamb. Poultry with skin. Hot dogs. [place] Salines. ? Dairy: Ice cream. Sour cream. Whole milk. ? Beverages: Juice. Sugar-sweetened soft drinks. Beer. Liquor and spirits. ? Fats and oils: Butter. Canola oil. Vegetable oil. Beef fat (tallow). Lard. ? Sweets and desserts: Cookies. Cakes. Pies. Candy. ? Seasoning and other foods: Mayonnaise. Premade sauces and marinades. ? The items listed may not be a complete list. Talk with your dietitian about what dietary choices are right for you. Summary  The Mediterranean diet includes both food and lifestyle choices.  Eat a variety of fresh fruits and vegetables, beans, nuts, seeds, and whole grains.  Limit the amount of red meat and sweets that you eat.  Talk with your health care provider about whether  it is safe for you to drink red wine in moderation. This means 1 glass a day for nonpregnant women and 2 glasses a day for men. A glass of wine equals 5 oz (150 mL). This information is not intended to replace advice given to you by your health care provider. Make sure you discuss any questions you have with your health care provider. Document Released: 10/15/2015 Document Revised: 11/17/2015 Document Reviewed: 10/15/2015 Elsevier Interactive Patient Education  2019 [employer].

## 2018-05-09 ENCOUNTER — Encounter (HOSPITAL_COMMUNITY): Payer: Self-pay | Admitting: Emergency Medicine

## 2018-05-09 ENCOUNTER — Telehealth: Payer: Self-pay | Admitting: Adult Health

## 2018-05-09 ENCOUNTER — Emergency Department (HOSPITAL_COMMUNITY)
Admission: EM | Admit: 2018-05-09 | Discharge: 2018-05-10 | Disposition: A | Discharge status: Home / Self Care | Payer: Medicare Other | Attending: Emergency Medicine | Admitting: Emergency Medicine

## 2018-05-09 ENCOUNTER — Other Ambulatory Visit: Payer: Self-pay

## 2018-05-09 DIAGNOSIS — R002 Palpitations: Secondary | ICD-10-CM | POA: Diagnosis present

## 2018-05-09 DIAGNOSIS — Z79899 Other long term (current) drug therapy: Secondary | ICD-10-CM | POA: Insufficient documentation

## 2018-05-09 DIAGNOSIS — I130 Hypertensive heart and chronic kidney disease with heart failure and stage 1 through stage 4 chronic kidney disease, or unspecified chronic kidney disease: Secondary | ICD-10-CM | POA: Diagnosis not present

## 2018-05-09 DIAGNOSIS — N183 Chronic kidney disease, stage 3 (moderate): Secondary | ICD-10-CM | POA: Diagnosis not present

## 2018-05-09 DIAGNOSIS — J45909 Unspecified asthma, uncomplicated: Secondary | ICD-10-CM | POA: Insufficient documentation

## 2018-05-09 DIAGNOSIS — Z8673 Personal history of transient ischemic attack (TIA), and cerebral infarction without residual deficits: Secondary | ICD-10-CM | POA: Diagnosis not present

## 2018-05-09 DIAGNOSIS — I509 Heart failure, unspecified: Secondary | ICD-10-CM | POA: Diagnosis not present

## 2018-05-09 MED ORDER — SODIUM CHLORIDE 0.9% FLUSH: 3.0000 mL | Freq: Once | INTRAVENOUS | Status: DC

## 2018-05-09 NOTE — Telephone Encounter (Signed)
Returned call to pt. He report for the past two days in the morning he has felt like his heart is racing. He doesn't have a way to check HR but states he can tell when it's out of rhythm. He denies any SOB or other symptoms. Appointment scheduled for 3/10 at 230 pm with Corine Shelter, PA. Pt advised to report to ED if he feels heart is consistently feeling like it's racing or he develop symptoms. Pt voiced understanding. Will route to MD for any further recommendations.

## 2018-05-09 NOTE — Telephone Encounter (Signed)
It's worth trying to see if we can get him in the AFib clinic earlier than that. MCr

## 2018-05-09 NOTE — ED Triage Notes (Signed)
Pt reports for intermittent "heart racing" for the last 2 days. Hx afib, has not missed any med doses. Denies chest pain/shortness of breath.

## 2018-05-09 NOTE — Telephone Encounter (Signed)
New Message   Patient c/o Palpitations:  High priority if patient c/o lightheadedness, shortness of breath, or chest pain  1) How long have you had palpitations/irregular HR/ Afib? Are you having the symptoms now? Patient states started 2 days ago and it only happens early in the morning.  2) Are you currently experiencing lightheadedness, SOB or CP? NO  3) Do you have a history of afib (atrial fibrillation) or irregular heart rhythm? Yes   4) Have you checked your BP or HR? (document readings if available): Not today and doesn't remember the last time he checked it.  5) Are you experiencing any other symptoms? NO

## 2018-05-09 NOTE — Telephone Encounter (Signed)
Attempted to contact pt to inform of appointment with a fib clinic on Friday 05/11/18 at 2:30 pm. Left message to call back.

## 2018-05-10 ENCOUNTER — Emergency Department (HOSPITAL_COMMUNITY): Payer: Medicare Other

## 2018-05-10 LAB — BASIC METABOLIC PANEL
Anion gap: 8 (ref 5–15)
BUN: 19 mg/dL (ref 8–23)
CO2: 22 mmol/L (ref 22–32)
Calcium: 9 mg/dL (ref 8.9–10.3)
Chloride: 105 mmol/L (ref 98–111)
Creatinine, Ser: 1.65 mg/dL — ABNORMAL HIGH (ref 0.61–1.24)
GFR calc Af Amer: 48 mL/min — ABNORMAL LOW (ref >60)
GFR calc non Af Amer: 42 mL/min — ABNORMAL LOW (ref >60)
GLUCOSE: 118 mg/dL — AB (ref 70–99)
Potassium: 4 mmol/L (ref 3.5–5.1)
Sodium: 135 mmol/L (ref 135–145)

## 2018-05-10 LAB — CBC
HCT: 41.1 % (ref 39.0–52.0)
Hemoglobin: 12.5 g/dL — ABNORMAL LOW (ref 13.0–17.0)
MCH: 27.9 pg (ref 26.0–34.0)
MCHC: 30.4 g/dL (ref 30.0–36.0)
MCV: 91.7 fL (ref 80.0–100.0)
Platelets: 214 10*3/uL (ref 150–400)
RBC: 4.48 MIL/uL (ref 4.22–5.81)
RDW: 12.8 % (ref 11.5–15.5)
WBC: 3.8 10*3/uL — ABNORMAL LOW (ref 4.0–10.5)
nRBC: 0 % (ref 0.0–0.2)

## 2018-05-10 LAB — I-STAT TROPONIN, ED: Troponin i, poc: 0 ng/mL (ref 0.00–0.08)

## 2018-05-10 NOTE — Telephone Encounter (Signed)
Spoke with pt who confirmed he spoke with the Afib clinic. Appointment was changed to Monday 3/9 at 9:30 am.

## 2018-05-10 NOTE — Discharge Instructions (Addendum)
Take your medications as prescribed and follow-up with the [facility]. fib clinic on Friday as scheduled.  Return to the ED with chest pain, shortness of breath, dizziness, lightheadedness, vomiting, any other concerns.

## 2018-05-10 NOTE — ED Provider Notes (Signed)
[facility] EMERGENCY DEPARTMENT Provider Note   CSN: [id number] Arrival date & time: 05/09/18  2322    History   Chief Complaint Chief Complaint  Patient presents with  . Atrial Fibrillation    HPI Darrell Perkins is a 68 y.o. male.     Patient with history of atrial fibrillation presenting with "heart pounding" and "beating like a drum" started last night around 8 PM.  States this came on at rest and lasted for about 30 minutes.  He did take an extra half dose of his losartan when this symptoms came on.  There is no associated dizziness, shortness of breath, chest pain, lightheadedness, nausea or vomiting.  Reports he has had similar symptoms waking him from sleep the past 2 mornings around 4 AM.  Reports palpitations that last for about 30 minutes then resolved.  He called his cardiologist and has an appointment with the A. fib clinic on March 6.  He called the nurse line tonight and was told to come to the hospital after he had an abrupt episode of racing heart while he was resting.  Reports he now feels back to baseline.  There is no chest pain or shortness of breath.  No dizziness or lightheadedness.  States compliance with his medications with no recent changes.  The history is provided by the patient.  Atrial Fibrillation  Pertinent negatives include no abdominal pain, no headaches and no shortness of breath.    Past Medical History:  Diagnosis Date  . Bronchitis    hx of   . CHF (congestive heart failure) (HCC)    EF 45%  . 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 GERD (gastroesophageal reflux disease)   . Hypertensive cardiovascular disease   . Kidney stones   . Sleep apnea    USES CPAP  . Typical atrial flutter (HCC) 10/2016    Patient Active Problem List   Diagnosis Date Noted  . Atrial flutter (HCC)   . Cerebrovascular accident (CVA) (HCC) 12/17/2017  . Chronic pain of left knee 03/06/2017  . Unilateral primary osteoarthritis, left knee  03/06/2017  . Leukopenia 11/03/2016  . Atrial fibrillation with RVR (HCC) 11/02/2016  . Allergic rhinitis 11/26/2014  . Cutaneous eruption 11/26/2014  . Uncomplicated asthma 11/26/2014  . Asthma, extrinsic, without status asthmaticus 11/26/2014  . Nephrolithiasis 11/26/2014  . Cellulitis and abscess of leg 11/26/2014  . ED (erectile dysfunction) of organic origin 11/26/2014  . Benign essential HTN 11/26/2014  . Peripheral neuralgia 11/26/2014  . Cephalalgia 11/26/2014  . Tinea cruris 11/26/2014  . Folliculitis 11/26/2014  . Hypercholesterolemia without hypertriglyceridemia 11/26/2014  . Hematuria, microscopic 11/26/2014  . H/O adenomatous polyp of colon 11/26/2014  . Snoring 11/26/2014  . Difficulty staying awake 11/26/2014  . Essential hypertension 11/26/2014  . Combined fat and carbohydrate induced hyperlipemia 11/26/2014  . Tinea pedis 11/26/2014  . Erectile dysfunction due to arterial insufficiency 11/26/2014  . Perifolliculitis capitis abscedens 11/26/2014  . Other constipation 11/26/2014  . Chest pain 11/26/2014  . Other fatigue 11/26/2014  . Pain of right leg 11/26/2014  . Lesion of lateral popliteal nerve 11/26/2014  . Chronic kidney disease, stage III (moderate) (HCC) 11/26/2014  . 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 Athlete's foot 11/26/2014  . Mixed hyperlipidemia 11/26/2014  . Adiposity 11/26/2014  . Anticoagulated 11/26/2014  . 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 Chest pain syndrome 11/26/2014  . Rapid atrial fibrillation (HCC) 11/26/2014  . OSA on CPAP 11/26/2014  . Encounter  for general adult medical examination without abnormal findings 09/02/2014    Past Surgical History:  Procedure Laterality Date  . colonscopy     . CYSTOSCOPY WITH RETROGRADE PYELOGRAM, URETEROSCOPY AND STENT PLACEMENT Right 08/01/2014   Procedure: CYSTOSCOPY WITH RIGHT  RETROGRADE PYELOGRAM/RIGHT URETEROSCOPY HOLMIUMN LASER LITOTRIPSY  STENT PLACEMENT ;  Surgeon:  Jerilee Field, MD;  Location: [facility];  Service: Urology;  Laterality: Right;  . URETEROSCOPY WITH HOLMIUM LASER LITHOTRIPSY Right 11/18/2016   Procedure: URETEROSCOPY WITH HOLMIUM LASER LITHOTRIPSY/STENT PLACEMENT;  Surgeon: Jerilee Field, MD;  Location: [facility];  Service: Urology;  Laterality: Right;        Home Medications    Prior to Admission medications   Medication Sig Start Date End Date Taking? Authorizing Provider  acetaminophen (TYLENOL) 650 MG CR tablet Take 650 mg by mouth 2 (two) times daily as needed for pain.    [provider]  albuterol (PROVENTIL HFA;VENTOLIN HFA) 108 (90 BASE) MCG/ACT inhaler Inhale 1-2 puffs into the lungs every 6 (six) hours as needed for wheezing. 04/14/12   Sunnie Nielsen, MD  apixaban (ELIQUIS) 5 MG TABS tablet Take 1 tablet (5 mg total) by mouth every 12 (twelve) hours. Additional refills by PCP, Neurology, or Cardiology Patient taking differently: Take 5 mg by mouth every 12 (twelve) hours.  12/18/17 01/17/18  Jerald Kief, MD  Bee Pollen 580 MG CAPS Take 1,160 mg by mouth daily.     [provider]  budesonide-formoterol (SYMBICORT) 160-4.5 MCG/ACT inhaler Inhale 2 puffs into the lungs every other day.     [provider]  Cholecalciferol (VITAMIN D) 2000 units tablet Take 2,000 Units by mouth daily.    [provider]  ezetimibe (ZETIA) 10 MG tablet Take 10 mg by mouth daily. 11/25/17   [provider]  fluticasone (FLONASE) 50 MCG/ACT nasal spray Place 1 spray into both nostrils daily. 12/07/17   [provider]  hydrOXYzine (ATARAX/VISTARIL) 25 MG tablet Take 1 tablet (25 mg total) by mouth every 6 (six) hours. 12/19/17   Liberty Handy, PA-C  losartan (COZAAR) 100 MG tablet Take 100 mg by mouth daily. 09/18/16   [provider]  meloxicam (MOBIC) 15 MG tablet Take 15 mg by mouth daily as needed for pain.  09/01/16   [provider]    Family History Family History    Problem Relation Age of Onset  . Hypertension Mother   . Diabetes Father   . Cerebrovascular Disease Father   . Alcohol abuse Father   . Hypertension Father   . COPD Father     Social History Social History   Tobacco Use  . Smoking status: Never Smoker  . Smokeless tobacco: Never Used  Substance Use Topics  . Alcohol use: No  . Drug use: No     Allergies   Allopurinol; Aspirin; Prednisone; Rosuvastatin; Simvastatin; and Tuberculin tests   Review of Systems Review of Systems  Constitutional: Negative for activity change, appetite change and fever.  HENT: Negative for congestion and rhinorrhea.   Eyes: Negative for visual disturbance.  Respiratory: Negative for chest tightness and shortness of breath.   Cardiovascular: Positive for palpitations.  Gastrointestinal: Negative for abdominal pain, nausea and vomiting.  Genitourinary: Negative for dysuria and hematuria.  Musculoskeletal: Negative for arthralgias and myalgias.  Skin: Negative for rash.  Neurological: Negative for dizziness, weakness, light-headedness and headaches.   all other systems are negative except as noted in the HPI and PMH.     Physical  Exam Updated Vital Signs BP (!) 147/79 (BP Location: Left Arm)   Pulse (!) 47   Temp (!) 97.5 F (36.4 C) (Oral)   Resp 18   SpO2 100%   Physical Exam Vitals signs and nursing note reviewed.  Constitutional:      General: He is not in acute distress.    Appearance: He is well-developed.  HENT:     Head: Normocephalic and atraumatic.     Mouth/Throat:     Pharynx: No oropharyngeal exudate.  Eyes:     Conjunctiva/sclera: Conjunctivae normal.     Pupils: Pupils are equal, round, and reactive to light.  Neck:     Musculoskeletal: Normal range of motion and neck supple.     Comments: No meningismus. Cardiovascular:     Rate and Rhythm: Normal rate and regular rhythm.     Heart sounds: Normal heart sounds. No murmur.  Pulmonary:     Effort: Pulmonary  effort is normal. No respiratory distress.     Breath sounds: Normal breath sounds.  Chest:     Chest wall: No tenderness.  Abdominal:     Palpations: Abdomen is soft.     Tenderness: There is no abdominal tenderness. There is no guarding or rebound.  Musculoskeletal: Normal range of motion.        General: No tenderness.  Skin:    General: Skin is warm.     Capillary Refill: Capillary refill takes less than 2 seconds.  Neurological:     General: No focal deficit present.     Mental Status: He is alert and oriented to person, place, and time.     Cranial Nerves: No cranial nerve deficit.     Motor: No abnormal muscle tone.     Coordination: Coordination normal.     Comments: No ataxia on finger to nose bilaterally. No pronator drift. 5/5 strength throughout. CN 2-12 intact.Equal grip strength. Sensation intact.   Psychiatric:        Behavior: Behavior normal.      ED Treatments / Results  Labs (all labs ordered are listed, but only abnormal results are displayed) Labs Reviewed  BASIC METABOLIC PANEL - Abnormal; Notable for the following components:      Result Value   Glucose, Bld 118 (*)    Creatinine, Ser 1.65 (*)    GFR calc non Af Amer 42 (*)    GFR calc Af Amer 48 (*)    All other components within normal limits  CBC - Abnormal; Notable for the following components:   WBC 3.8 (*)    Hemoglobin 12.5 (*)    All other components within normal limits  I-STAT TROPONIN, ED    EKG EKG Interpretation  Date/Time:  Thursday May 10 2018 05:01:06 EST Ventricular Rate:  51 PR Interval:    QRS Duration: 94 QT Interval:  440 QTC Calculation: 406 R Axis:   25 Text Interpretation:  Sinus rhythm Atrial premature complex Posterior infarct, old No significant change was found Confirmed by Glynn Octave [phone]) on 05/10/2018 5:05:59 AM   Radiology Dg Chest 2 View  Result Date: 05/10/2018 CLINICAL DATA:  Tachycardia for the past 2 days EXAM: CHEST - 2 VIEW COMPARISON:   12/16/2017 FINDINGS: The heart size and mediastinal contours are within normal limits. Both lungs are clear. No pulmonary edema. No effusion or pneumothorax. The visualized skeletal structures are unremarkable. IMPRESSION: No active cardiopulmonary disease. Electronically Signed   By: Tollie Eth M.D.   On: 05/10/2018 00:14  Procedures Procedures (including critical care time)  Medications Ordered in ED Medications  sodium chloride flush (NS) 0.9 % injection 3 mL (has no administration in time range)     Initial Impression / Assessment and Plan / ED Course  I have reviewed the triage vital signs and the nursing notes.  Pertinent labs & imaging results that were available during my care of the patient were reviewed by me and considered in my medical decision making (see chart for details).       Episode of palpitations that is since resolved.  No chest pain or shortness of breath.  EKG shows sinus bradycardia with a rate of 49.  Labs are reassuring.  Patient has maintained sinus rhythm throughout his ED stay.  No dizziness, lightheadedness, chest pain, shortness of breath.  No further palpitations.  He is bradycardic but has no symptoms and is in sinus rhythm.  Discussed continuation of his medications and follow-up in the A. fib clinic as he is scheduled in 2 days.  Return precautions discussed including chest pain, shortness of breath, dizziness or any other concerns. Final Clinical Impressions(s) / ED Diagnoses   Final diagnoses:  Palpitations    ED Discharge Orders    None       Mahlani Berninger, Jeannett SeniorStephen, MD 05/10/18 [phone]

## 2018-05-10 NOTE — ED Notes (Signed)
Patient verbalizes understanding of discharge instructions. Opportunity for questioning and answers were provided. Armband removed by staff, pt discharged from ED ambulatory.   

## 2018-05-11 ENCOUNTER — Ambulatory Visit (HOSPITAL_COMMUNITY): Payer: Medicare Other | Admitting: Nurse Practitioner

## 2018-05-14 ENCOUNTER — Ambulatory Visit (HOSPITAL_COMMUNITY): Payer: Medicare Other | Admitting: Nurse Practitioner

## 2018-05-15 ENCOUNTER — Ambulatory Visit (INDEPENDENT_AMBULATORY_CARE_PROVIDER_SITE_OTHER): Payer: Medicare Other | Admitting: Cardiology

## 2018-05-15 ENCOUNTER — Encounter: Payer: Self-pay | Admitting: Cardiology

## 2018-05-15 VITALS — BP 130/80 | HR 60 | Ht 74.5 in | Wt 252.4 lb

## 2018-05-15 DIAGNOSIS — I480 Paroxysmal atrial fibrillation: Secondary | ICD-10-CM | POA: Diagnosis not present

## 2018-05-15 NOTE — Patient Instructions (Signed)
Medication Instructions:  Your physician recommends that you continue on your current medications as directed. Please refer to the Current Medication list given to you today. If you need a refill on your cardiac medications before your next appointment, please call your pharmacy.   Lab work: None  If you have labs (blood work) drawn today and your tests are completely normal, you will receive your results only by: Marland Kitchen MyChart Message (if you have MyChart) OR . A paper copy in the mail If you have any lab test that is abnormal or we need to change your treatment, we will call you to review the results.  Testing/Procedures: None   Follow-Up: At [facility], you and your health needs are our priority.  As part of our continuing mission to provide you with exceptional heart care, we have created designated Provider Care Teams.  These Care Teams include your primary Cardiologist (physician) and Advanced Practice Providers (APPs -  Physician Assistants and Nurse Practitioners) who all work together to provide you with the care you need, when you need it. You will need a follow up appointment in 6 months.  Please call our office 2 months (July) in advance to schedule this appointment.  You may see Thurmon Fair, MD or one of the following Advanced Practice Providers on your designated Care Team: [place], [place] . Micah Flesher, PA-C  Any Other Special Instructions Will Be Listed Below (If Applicable).

## 2018-05-15 NOTE — Progress Notes (Signed)
05/15/2018 Darrell Perkins   [birth year]  [id number]  Primary Physician [name], MD Primary Cardiologist: Dr Duke Salvia  HPI:  Mr Guldner is a pleasant 68 y.o.AA malewith a hx of atrial flutter requiring a brief hospitalization in August 2018, obstructive sleep apnea-on C-pap, chronic kidney disease stage III, nephrolithiasis, and essential hypertension. He briefly took anticoagulants in in 2018 but stopped them due to hematuria from nephrolithiasis. In late Sept 2019 he saw his PCP for "headaches". He was treated for sinusitis but did not improve. A CT of his head was ordered by his PCP 12/12/17. This apparently revealed a small CVA. A few days later the patient went to the ED with vague symptoms, w/u was unremarkable and he was discharged. He woke up later that night confused and had his son take him back to the ED. He was admitted and an MRI on 12/17/17 showed an acute right posterior temporal/parietal white matter infarct. He was seen in consult by Dr [name]. Eliquis was added.  He has done well until this past week when he noted palpitations. He became concerned and was instructed to go the ED if he felt uncomfortable but he was also given an OV in the AF clinic for 3/6.  He was seen in the ED 05/10/2018.  He was in NSR with PACs.  He did not go for his appointment with the AF clinic.   Today he tells me he had what sounds like PACs, not sustained tachycardia. He has been working out at [employer] without difficulty.  His EKG today shows NSR, PACs.    Current Outpatient Medications  Medication Sig Dispense Refill  . acetaminophen (TYLENOL) 650 MG CR tablet Take 650 mg by mouth 2 (two) times daily as needed for pain.    Marland Kitchen albuterol (PROVENTIL HFA;VENTOLIN HFA) 108 (90 BASE) MCG/ACT inhaler Inhale 1-2 puffs into the lungs every 6 (six) hours as needed for wheezing. 1 Inhaler 0  . apixaban (ELIQUIS) 5 MG TABS tablet Take 1 tablet (5 mg total) by mouth every 12 (twelve) hours. Additional refills by  PCP, Neurology, or Cardiology (Patient taking differently: Take 5 mg by mouth every 12 (twelve) hours. ) 60 tablet 0  . Bee Pollen 580 MG CAPS Take 1,160 mg by mouth daily.     . budesonide-formoterol (SYMBICORT) 160-4.5 MCG/ACT inhaler Inhale 2 puffs into the lungs every other day.     . Cholecalciferol (VITAMIN D) 2000 units tablet Take 2,000 Units by mouth daily.    Marland Kitchen ezetimibe (ZETIA) 10 MG tablet Take 10 mg by mouth daily.  3  . fluticasone (FLONASE) 50 MCG/ACT nasal spray Place 1 spray into both nostrils daily.  2  . hydrOXYzine (ATARAX/VISTARIL) 25 MG tablet Take 1 tablet (25 mg total) by mouth every 6 (six) hours. 12 tablet 0  . losartan (COZAAR) 100 MG tablet Take 100 mg by mouth daily.  3  . meloxicam (MOBIC) 15 MG tablet Take 15 mg by mouth daily as needed for pain.   1   No current facility-administered medications for this visit.     Allergies  Allergen Reactions  . Allopurinol Other (See Comments)    "Upset stomach"  . Aspirin Other (See Comments)    Upset stomach  . Prednisone Other (See Comments)    "Irritable"  . Rosuvastatin Other (See Comments)    Mental disturbance  . Simvastatin Other (See Comments)    Tired and achy  . Tuberculin Tests Other (See Comments)  Intense local reaction    Past Medical History:  Diagnosis Date  . Bronchitis    hx of   . CHF (congestive heart failure) (HCC)    EF 45%  . 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 GERD (gastroesophageal reflux disease)   . Hypertensive cardiovascular disease   . Kidney stones   . Sleep apnea    USES CPAP  . Typical atrial flutter (HCC) 10/2016    Social History   Socioeconomic History  . Marital status: Married    Spouse name: Not on file  . Number of children: Not on file  . Years of education: Not on file  . Highest education level: Not on file  Occupational History  . Not on file  Social Needs  . Financial resource strain: Not on file  . Food insecurity:    Worry: Not on  file    Inability: Not on file  . Transportation needs:    Medical: Not on file    Non-medical: Not on file  Tobacco Use  . Smoking status: Never Smoker  . Smokeless tobacco: Never Used  Substance and Sexual Activity  . Alcohol use: No  . Drug use: No  . Sexual activity: Not on file  Lifestyle  . Physical activity:    Days per week: Not on file    Minutes per session: Not on file  . Stress: Not on file  Relationships  . Social connections:    Talks on phone: Not on file    Gets together: Not on file    Attends religious service: Not on file    Active member of club or organization: Not on file    Attends meetings of clubs or organizations: Not on file    Relationship status: Not on file  . Intimate partner violence:    Fear of current or ex partner: Not on file    Emotionally abused: Not on file    Physically abused: Not on file    Forced sexual activity: Not on file  Other Topics Concern  . Not on file  Social History Narrative   Pt is right handed   Pt lives in single story home with his wife and grandson   Has 3 children   12th grade education   Retired from [employer] - [employer]s.      Family History  Problem Relation Age of Onset  . Hypertension Mother   . Diabetes Father   . Cerebrovascular Disease Father   . Alcohol abuse Father   . Hypertension Father   . COPD Father      Review of Systems: General: negative for chills, fever, night sweats or weight changes.  Cardiovascular: negative for chest pain, dyspnea on exertion, edema, orthopnea, palpitations, paroxysmal nocturnal dyspnea or shortness of breath Dermatological: negative for rash Respiratory: negative for cough or wheezing Urologic: negative for hematuria Abdominal: negative for nausea, vomiting, diarrhea, bright red blood per rectum, melena, or hematemesis Neurologic: negative for visual changes, syncope, or dizziness All other systems reviewed and are otherwise negative except as  noted above.    Blood pressure 130/80, pulse 60, height 6' 2.5" (1.892 m), weight 252 lb 6.4 oz (114.5 kg), SpO2 98 %.  General appearance: alert, cooperative and no distress Lungs: clear to auscultation bilaterally Heart: regular rate and rhythm Extremities: no edema Skin: Skin color, texture, turgor normal. No rashes or lesions Neurologic: Grossly normal  EKG NSR-sinus arrhythmia HR 60  ASSESSMENT AND PLAN:  Palpitations- Prompted ED visit 05/10/2018. He was in NSR with PACs.  By his history it does not sound like he had PAF, most likely PACs.  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  Atrial flutter (HCC) Past documented atrial flutter  Anticoagulated CHADS2VASC is 5- Eliquis 5 mg BID added Oct 2019  Chronic kidney disease, stage III (moderate) (HCC) SCr 2.0  Essential hypertension Normal LVF with mild LVH by echo Oct 2019  OSA on CPAP Reports compliance   PLAN  Same Rx.  He thought the Eliquis might be causing some GI upset and I encouraged him to continue it.  I offered to add a PPI but he declined.  He is not on a beta blocker or Diltiazem secondary to baseline bradycardia.   Corine Shelter PA-C 05/15/2018 2:42 PM

## 2018-08-31 IMAGING — DX DG CHEST 1V PORT
1 series · 1 of 1 positions shown · non-contrast
Comparison: 04/14/2012

CLINICAL DATA: Pt FRANCKO WARTHON from [HOSPITAL] where pt went to be
evaluated for "pounding in his chest" since [REDACTED]. Pt reports
history of A Fib. Pt also reports passing 4 kidney stones in the
last week.

EXAM:
PORTABLE CHEST 1 VIEW

[chest ap]
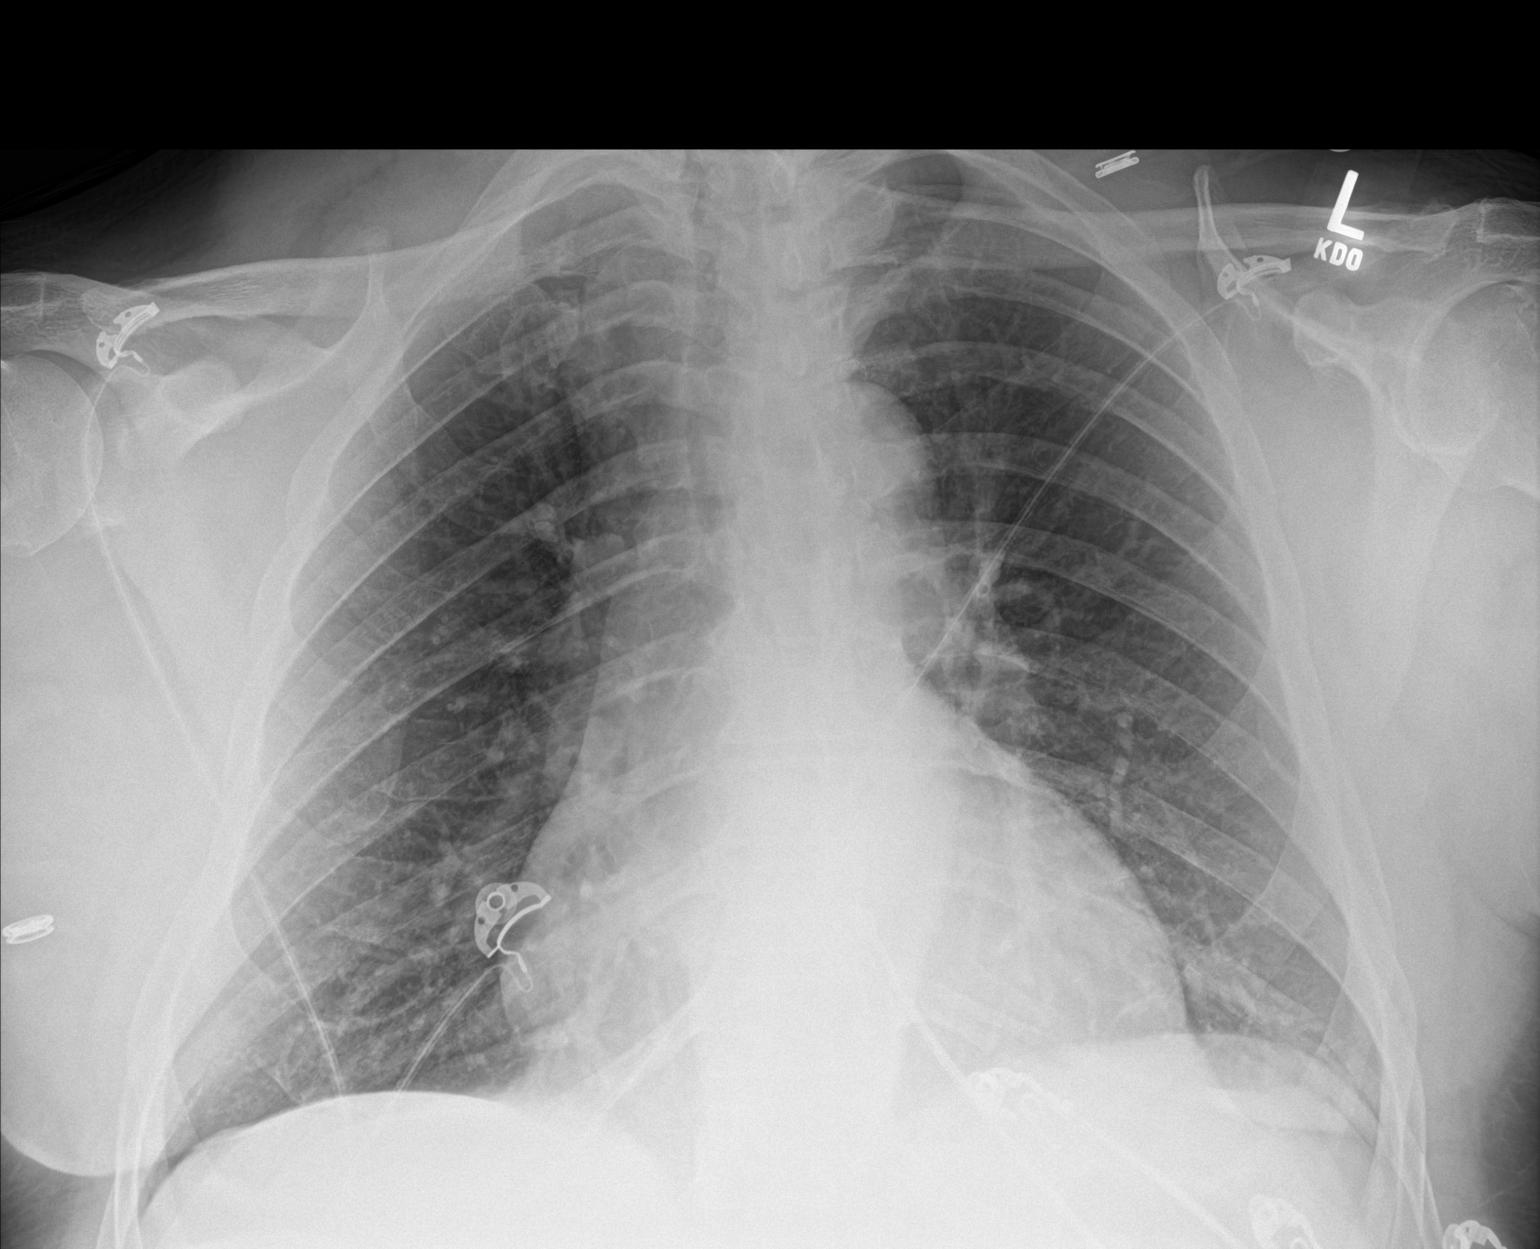

[1 of 1 positions shown; findings below may reference images not displayed]

FINDINGS: Heart size is accentuated by the portable AP position. There is
minimal left lower lobe atelectasis. There are no focal
consolidations or pleural effusions. No pulmonary edema.
IMPRESSION: No evidence for acute  abnormality.

## 2018-11-16 ENCOUNTER — Telehealth: Payer: Medicare Other | Admitting: Cardiovascular Disease

## 2018-12-05 DIAGNOSIS — I519 Heart disease, unspecified: Secondary | ICD-10-CM | POA: Insufficient documentation

## 2018-12-05 NOTE — Progress Notes (Deleted)
Cardiology Office Note:    Date:  12/05/2018   ID:  Sivan, Quast 1951-02-20, MRN [id number]  PCP:  Josetta Huddle, MD  Cardiologist:  Sanda Klein, MD   Referring MD: Josetta Huddle, MD   No chief complaint on file. Atrial flutter  History of Present Illness:    DHANI DANNEMILLER is a 68 y.o. male with a hx of atrial flutter requiring brief hospitalization in August 2018, asthma, obstructive sleep apnea, chronic kidney disease stage III, nephrolithiasis, essential hypertension.  He has not had any symptomatic episodes of atrial flutter since August 2018.  Reportedly he had an episode of "atrial fibrillation" 25 years ago when he was living in [place].  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  He has not had clinical heart failure.    The patient specifically denies any chest pain at rest exertion, dyspnea at rest or with exertion, orthopnea, paroxysmal nocturnal dyspnea, syncope, palpitations, focal neurological deficits, intermittent claudication, lower extremity edema, unexplained weight gain, cough, hemoptysis or wheezing. He denies any problems with daytime hypersomnolence.  He is compliant with CPAP.    Prior to COVID 19 he was going to the gym 5 days a week.  Dr. Rayann Heman saw him on 01/15/2018   "I think it may be reasonable to proceed with atrial flutter ablation, with plans also to continue long term anticoagulation and to also monitor for afib going forward with an ILR as advised by Dr Caryl Comes."  This was discussed with patient. He did not have the ablation as he remained in [facility]. As such, ILR was not planned. He continues on apixaban.   Past Medical History:  Diagnosis Date  . Bronchitis    hx of   . CHF (congestive heart failure) (HCC)    EF 45%  . 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 GERD (gastroesophageal reflux disease)   .  Hypertensive cardiovascular disease   . Kidney stones   . Sleep apnea    USES CPAP  . Typical atrial flutter ([facility]) 10/2016    Past Surgical History:  Procedure Laterality Date  . colonscopy     . CYSTOSCOPY WITH RETROGRADE PYELOGRAM, URETEROSCOPY AND STENT PLACEMENT Right 08/01/2014   Procedure: CYSTOSCOPY WITH RIGHT  RETROGRADE PYELOGRAM/RIGHT URETEROSCOPY [place] ;  Surgeon: Festus Aloe, MD;  Location: [facility];  Service: Urology;  Laterality: Right;  . URETEROSCOPY WITH HOLMIUM LASER LITHOTRIPSY Right 11/18/2016   Procedure: URETEROSCOPY WITH HOLMIUM LASER LITHOTRIPSY/STENT PLACEMENT;  Surgeon: Festus Aloe, MD;  Location: [facility];  Service: Urology;  Laterality: Right;    Current Medications: No outpatient medications have been marked as taking for the 12/10/18 encounter (Appointment) with Lateesha Bezold, Dani Gobble, MD.     Allergies:   Allopurinol, Aspirin, Prednisone, Rosuvastatin, Simvastatin, and Tuberculin tests   Social History   Socioeconomic History  . Marital status: Married    Spouse name: Not on file  . Number of children: Not on file  . Years of education: Not on file  . Highest education level: Not on file  Occupational History  . Not on file  Social Needs  . Financial resource strain: Not on file  . Food insecurity    Worry: Not on file    Inability: Not on file  . Transportation needs    Medical: Not on file    Non-medical: Not on file  Tobacco Use  . Smoking status: Never Smoker  . Smokeless tobacco: Never Used  Substance and Sexual Activity  . Alcohol use: No  . Drug use: No  . Sexual activity: Not on file  Lifestyle  . Physical activity    Days per week: Not on file    Minutes per session: Not on file  . Stress: Not on file  Relationships  . Social Musicianconnections    Talks on phone: Not on file    Gets together: Not on file    Attends religious service: Not on file    Active member of club or organization: Not on  file    Attends meetings of clubs or organizations: Not on file    Relationship status: Not on file  Other Topics Concern  . 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      Family History: The patient's family history includes Alcohol abuse in his father; COPD in his father; Cerebrovascular Disease in his father; Diabetes in his father; Hypertension in his father and mother.  ROS:   Please see the history of present illness.     All other systems reviewed and are negative.  EKGs/Labs/Other Studies Reviewed:    The following studies were reviewed today: Echo OCT 2019  - Left ventricle: The cavity size was normal. Wall thickness was   increased in a pattern of mild LVH. Systolic function was normal.   The estimated ejection fraction was in the range of 55% to 60%.  Wall motion was normal; there were no regional wall motion abnormalities. Doppler parameters are consistent with abnormal left ventricular relaxation (grade 1 diastolic dysfunction).   EKG:  EKG is ***ordered today.  The ekg ordered today demonstrates sinus rhythm with isolated PACs, normal repolarization pattern, QTC 470 ms  Recent Labs: 12/19/2017: ALT 16 05/09/2018: BUN 19; Creatinine, Ser 1.65; Hemoglobin 12.5; Platelets 214; Potassium 4.0; Sodium 135  Recent Lipid Panel    Component Value Date/Time   CHOL 181 12/18/2017 0339   TRIG 119 12/18/2017 0339   HDL 30 (L) 12/18/2017 0339   CHOLHDL 6.0 12/18/2017 0339   VLDL 24 12/18/2017 0339   LDLCALC 127 (H) 12/18/2017 0339    Physical Exam:    VS:  There were no vitals taken for this visit.    Wt Readings from Last 3 Encounters:  05/15/18 252 lb 6.4 oz (114.5 kg)  05/01/18 252 lb (114.3 kg)  04/17/18 258 lb 12.8 oz (117.4 kg)      General: Alert, oriented x3, no distress, *** Head: no  evidence of trauma, PERRL, EOMI, no exophtalmos or lid lag, no myxedema, no xanthelasma; normal ears, nose and oropharynx Neck: normal jugular venous pulsations and no hepatojugular reflux; brisk carotid pulses without delay and no carotid bruits Chest: clear to auscultation, no signs of consolidation by percussion or palpation, normal fremitus, symmetrical and full respiratory excursions Cardiovascular: normal position and quality of the apical impulse, regular rhythm, normal first and second heart sounds, no murmurs, rubs or gallops Abdomen: no tenderness or distention, no masses by palpation, no abnormal pulsatility or arterial bruits, normal bowel sounds, no hepatosplenomegaly Extremities: no clubbing, cyanosis or edema; 2+ radial, ulnar and brachial pulses bilaterally; 2+ right femoral, posterior tibial and dorsalis pedis pulses; 2+ left femoral,  posterior tibial and dorsalis pedis pulses; no subclavian or femoral bruits Neurological: grossly nonfocal Psych: Normal mood and affect   ASSESSMENT:    1. Typical atrial flutter (HCC)   2. Left ventricular systolic dysfunction   3. OSA on CPAP   4. Essential hypertension    PLAN:    In order of problems listed above:  1. AFlutter: I have not seen any documentation of atrial fibrillation, just typical atrial flutter.  The arrhythmia is extremely infrequent.  It might be related to obesity/obstructive sleep apnea.  He does not want to take anticoagulants, especially since he had hematuria.  CHADSVasc 3 (age, HTN, decreased left ventricular systolic function). I have recommended that he undergo ablation if he has recurrent atrial flutter.  Recheck left ventricular systolic function with an echocardiogram. 2. LV systolic dysfunction: resolved with rate control, consistent with tachycardia cardiomyopathy.   3. OSA: Discussed the relationship with sleep apnea and atrial flutter.  Recommended 100% compliance with CPAP.  Also recommended weight loss.    4. Mild obesity: Will try to get down to a weight of 230 pounds by this time next year, which would correspond to a BMI just under 30 5. HTN mildly elevated blood pressure today, but he reports blood pressure was normal when he saw [name] last week.  No changes made to his medications.  I encouraged him to go to the gym and to perform aerobic activity and light weight lifting.  He should be focusing on relatively light weights (able to perform 10-20 reps) rather than intense sudden strain with bench pressing/etc.   Medication Adjustments/Labs and Tests Ordered: Current medicines are reviewed at length with the patient today.  Concerns regarding medicines are outlined above.  No orders of the defined types were placed in this encounter.  No orders of the defined types were placed in this encounter.   There are no Patient Instructions on file for this visit.   Signed, Thurmon Fair, MD  12/05/2018 4:09 PM    [facility]

## 2018-12-10 ENCOUNTER — Ambulatory Visit: Payer: Medicare Other | Admitting: Cardiovascular Disease

## 2018-12-21 ENCOUNTER — Telehealth: Payer: Self-pay | Admitting: Cardiovascular Disease

## 2018-12-21 NOTE — Telephone Encounter (Signed)
Called patient on cell phone, but, VM was full.

## 2018-12-21 NOTE — Telephone Encounter (Signed)
LVM for patient to call back and schedule 6 month followup with Dr. Sallyanne Kuster.

## 2018-12-31 ENCOUNTER — Ambulatory Visit: Payer: Medicare Other | Admitting: Neurology

## 2018-12-31 NOTE — Progress Notes (Signed)
NEUROLOGY FOLLOW UP OFFICE NOTE  Darrell Perkins [id number]  HISTORY OF PRESENT ILLNESS: Darrell Perkins is a 68 year old right-handed African-American man with paroxysmal atrial fibrillation, congestive heart failure, CKD stage III, hypertension, and sleep apnea who follows up for stroke.  UPDATE: He is currently on Eliquis, Zetia, losartan, hydroxyzine He has been feeling well.  Since the stroke, he reports increased libido.  It is not a problem, he just noticed it.  He has longstanding history of headaches.  Sometimes he has a dull throbbing headache once a week.  He takes Extra-strength Tylenol and it is gone in a few minutes.  HISTORY: [facility] was [facility]/17 to 12/18/17 for stroke. About 2 weeks prior to presentation, he started experiencing headache, neck and back pain and difficulty focusing. He is a Optician, dispensingminister at [employer] and was having difficulty reading his lessons.His PCP prescribed him antibiotics for presumed sinusitis. When symptoms didn't subside, a head CT without contrast was performed on 12/12/2017, which demonstrated chronic small vessel ischemic changes, includingprior right posterior temporal/parietal/occipital junctional infarctandinfarctdjacent to therightcentrum semiovale. He was placed on Plavix.Due to persistent confusion, not feeling right, and feeling heart flutter, he returned to the hospital on day of admission. MRI of the brain revealed right posterior temporal/parietal infarct. MRA of head revealed no large vessel occlusion or stenosis. Carotid Doppler revealed 1-39% bilateral ICA stenosis with antegrade flow of both vertebral arteries. 2D echocardiogram demonstrated ejection fraction of 55-60% with no source of embolus. LDL was 127. Hemoglobin A1c was 6.6. Due to history of atrial fibrillation, Plavix was stopped and he was started on Eliquis for secondary stroke prevention. He was discharged on Zetia.  PAST MEDICAL  HISTORY: Past Medical History:  Diagnosis Date  . Bronchitis    hx of   . CHF (congestive heart failure) (HCC)    EF 45%  . 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 GERD (gastroesophageal reflux disease)   . Hypertensive cardiovascular disease   . Kidney stones   . Sleep apnea    USES CPAP  . Typical atrial flutter (HCC) 10/2016    MEDICATIONS: Current Outpatient Medications on File Prior to Visit  Medication Sig Dispense Refill  . acetaminophen (TYLENOL) 650 MG CR tablet Take 650 mg by mouth 2 (two) times daily as needed for pain.    Marland Kitchen. albuterol (PROVENTIL HFA;VENTOLIN HFA) 108 (90 BASE) MCG/ACT inhaler Inhale 1-2 puffs into the lungs every 6 (six) hours as needed for wheezing. 1 Inhaler 0  . apixaban (ELIQUIS) 5 MG TABS tablet Take 1 tablet (5 mg total) by mouth every 12 (twelve) hours. Additional refills by PCP, Neurology, or Cardiology (Patient taking differently: Take 5 mg by mouth every 12 (twelve) hours. ) 60 tablet 0  . Bee Pollen 580 MG CAPS Take 1,160 mg by mouth daily.     . budesonide-formoterol (SYMBICORT) 160-4.5 MCG/ACT inhaler Inhale 2 puffs into the lungs every other day.     . Cholecalciferol (VITAMIN D) 2000 units tablet Take 2,000 Units by mouth daily.    Marland Kitchen. ezetimibe (ZETIA) 10 MG tablet Take 10 mg by mouth daily.  3  . fluticasone (FLONASE) 50 MCG/ACT nasal spray Place 1 spray into both nostrils daily.  2  . hydrOXYzine (ATARAX/VISTARIL) 25 MG tablet Take 1 tablet (25 mg total) by mouth every 6 (six) hours. 12 tablet 0  . losartan (COZAAR) 100 MG tablet Take 100 mg by mouth daily.  3  . meloxicam (MOBIC) 15  MG tablet Take 15 mg by mouth daily as needed for pain.   1   No current facility-administered medications on file prior to visit.     ALLERGIES: Allergies  Allergen Reactions  . Allopurinol Other (See Comments)    "Upset stomach"  . Aspirin Other (See Comments)    Upset stomach  . Prednisone Other (See Comments)    "Irritable"  . Rosuvastatin  Other (See Comments)    Mental disturbance  . Simvastatin Other (See Comments)    Tired and achy  . Tuberculin Tests Other (See Comments)    Intense local reaction    FAMILY HISTORY: Family History  Problem Relation Age of Onset  . Hypertension Mother   . Diabetes Father   . Cerebrovascular Disease Father   . Alcohol abuse Father   . Hypertension Father   . COPD Father    SOCIAL HISTORY: Social History   Socioeconomic History  . Marital status: Married    Spouse name: Not on file  . Number of children: Not on file  . Years of education: Not on file  . Highest education level: Not on file  Occupational History  . Not on file  Social Needs  . Financial resource strain: Not on file  . Food insecurity    Worry: Not on file    Inability: Not on file  . Transportation needs    Medical: Not on file    Non-medical: Not on file  Tobacco Use  . Smoking status: Never Smoker  . Smokeless tobacco: Never Used  Substance and Sexual Activity  . Alcohol use: No  . Drug use: No  . Sexual activity: Not on file  Lifestyle  . Physical activity    Days per week: Not on file    Minutes per session: Not on file  . Stress: Not on file  Relationships  . Social Herbalist on phone: Not on file    Gets together: Not on file    Attends religious service: Not on file    Active member of club or organization: Not on file    Attends meetings of clubs or organizations: Not on file    Relationship status: Not on file  . Intimate partner violence    Fear of current or ex partner: Not on file    Emotionally abused: Not on file    Physically abused: Not on file    Forced sexual activity: Not on file  Other Topics Concern  . 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     REVIEW OF SYSTEMS: Constitutional: No  fevers, chills, or sweats, no generalized fatigue, change in appetite Eyes: No visual changes, double vision, eye pain Ear, nose and throat: No hearing loss, ear pain, nasal congestion, sore throat Cardiovascular: No chest pain, palpitations Respiratory:  No shortness of breath at rest or with exertion, wheezes GastrointestinaI: No nausea, vomiting, diarrhea, abdominal pain, fecal incontinence Genitourinary:  No dysuria, urinary retention or frequency Musculoskeletal:  No neck pain, back pain Integumentary: No rash, pruritus, skin lesions Neurological: as above Psychiatric: No depression, insomnia, anxiety Endocrine: No palpitations, fatigue, diaphoresis, mood swings, change in appetite, change in weight, increased thirst Hematologic/Lymphatic:  No purpura, petechiae. Allergic/Immunologic: no itchy/runny eyes, nasal congestion, recent allergic reactions,  rashes  PHYSICAL EXAM: Blood pressure (!) 159/77, pulse (!) 55, height 6\' 2"  (1.88 m), weight 250 lb 12.8 oz (113.8 kg), SpO2 95 %. General: No acute distress.  Patient appears well-groomed.   Head:  Normocephalic/atraumatic Eyes:  Fundi examined but not visualized Neck: supple, no paraspinal tenderness, full range of motion Heart:  Regular rate and rhythm Lungs:  Clear to auscultation bilaterally Back: No paraspinal tenderness Neurological Exam: alert and oriented to person, place, and time. Attention span and concentration intact, recent and remote memory intact, fund of knowledge intact.  Speech fluent and not dysarthric, language intact.  CN II-XII intact. Bulk and tone normal, muscle strength 5/5 throughout.  Sensation to light touch, temperature and vibration intact.  Deep tendon reflexes 2+ throughout, toes downgoing.  Finger to nose and heel to shin testing intact.  Gait normal, Romberg negative.  IMPRESSION: 1.  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  Atrial fibrillation 3.   Hypertension 4.  Hyperlipidemia  PLAN: Management as per PCP and cardiology: 1.  Eliquis for secondary stroke prevention 2.  Zetia.  LDL goal should be less than 70 3.  Follow up with PCP to optimize blood pressure control 4.  Continue exercise 5.  Mediterranean diet 6.  Follow up as needed  15 minutes spent face to face with patient, over 50% spent discussing management.   , DO  CC: Shon Millet, MD

## 2019-01-01 ENCOUNTER — Other Ambulatory Visit: Payer: Self-pay

## 2019-01-01 ENCOUNTER — Encounter: Payer: Self-pay | Admitting: Neurology

## 2019-01-01 ENCOUNTER — Ambulatory Visit: Payer: Medicare Other | Admitting: Neurology

## 2019-01-01 VITALS — BP 159/77 | HR 55 | Ht 74.0 in | Wt 250.8 lb

## 2019-01-01 DIAGNOSIS — I639 Cerebral infarction, unspecified: Secondary | ICD-10-CM | POA: Diagnosis not present

## 2019-01-01 DIAGNOSIS — I10 Essential (primary) hypertension: Secondary | ICD-10-CM

## 2019-01-01 DIAGNOSIS — E785 Hyperlipidemia, unspecified: Secondary | ICD-10-CM | POA: Diagnosis not present

## 2019-01-01 DIAGNOSIS — I480 Paroxysmal atrial fibrillation: Secondary | ICD-10-CM | POA: Diagnosis not present

## 2019-01-01 NOTE — Patient Instructions (Signed)
1.  Continue current medications.  2.  I would recommend seeing the eye doctor 3.  You may follow up as needed

## 2019-03-20 DIAGNOSIS — G4733 Obstructive sleep apnea (adult) (pediatric): Secondary | ICD-10-CM | POA: Diagnosis not present

## 2019-03-28 DIAGNOSIS — G4733 Obstructive sleep apnea (adult) (pediatric): Secondary | ICD-10-CM | POA: Diagnosis not present

## 2019-04-08 DIAGNOSIS — M7062 Trochanteric bursitis, left hip: Secondary | ICD-10-CM | POA: Diagnosis not present

## 2019-04-20 DIAGNOSIS — G4733 Obstructive sleep apnea (adult) (pediatric): Secondary | ICD-10-CM | POA: Diagnosis not present

## 2019-05-14 DIAGNOSIS — G4733 Obstructive sleep apnea (adult) (pediatric): Secondary | ICD-10-CM | POA: Diagnosis not present

## 2019-05-18 DIAGNOSIS — G4733 Obstructive sleep apnea (adult) (pediatric): Secondary | ICD-10-CM | POA: Diagnosis not present

## 2019-05-22 ENCOUNTER — Telehealth: Payer: Self-pay

## 2019-05-22 ENCOUNTER — Encounter: Payer: Self-pay | Admitting: Physician Assistant

## 2019-05-22 ENCOUNTER — Telehealth (INDEPENDENT_AMBULATORY_CARE_PROVIDER_SITE_OTHER): Payer: Medicare HMO | Admitting: Physician Assistant

## 2019-05-22 DIAGNOSIS — N183 Chronic kidney disease, stage 3 unspecified: Secondary | ICD-10-CM

## 2019-05-22 DIAGNOSIS — I10 Essential (primary) hypertension: Secondary | ICD-10-CM

## 2019-05-22 DIAGNOSIS — G4733 Obstructive sleep apnea (adult) (pediatric): Secondary | ICD-10-CM

## 2019-05-22 DIAGNOSIS — Z8673 Personal history of transient ischemic attack (TIA), and cerebral infarction without residual deficits: Secondary | ICD-10-CM

## 2019-05-22 DIAGNOSIS — I4892 Unspecified atrial flutter: Secondary | ICD-10-CM | POA: Diagnosis not present

## 2019-05-22 NOTE — Progress Notes (Signed)
Virtual Visit via Telephone Note   This visit type was conducted due to national recommendations for restrictions regarding the COVID-19 Pandemic (e.g. social distancing) in an effort to limit this patient's exposure and mitigate transmission in our community.  Due to his co-morbid illnesses, this patient is at least at moderate risk for complications without adequate follow up.  This format is felt to be most appropriate for this patient at this time.  The patient did not have access to video technology/had technical difficulties with video requiring transitioning to audio format only (telephone).  All issues noted in this document were discussed and addressed.  No physical exam could be performed with this format.  Please refer to the patient's chart for his  consent to telehealth for [facility].   The patient was identified using 2 identifiers.  Date:  05/24/2019   ID:  Darrell Perkins, DOB 1950/07/26, MRN [id number]  Patient Location: Home Provider Location: Home  PCP:  Josetta Huddle, MD  Cardiologist:  Sanda Klein, MD  Electrophysiologist:  Thompson Grayer, MD   Evaluation Performed:  Follow-Up Visit  Chief Complaint:  followup  History of Present Illness:    Darrell Perkins is a 69 y.o. male with past medical history of atrial flutter, OSA on CPAP, CKD stage III, hypertension and history of CVA.  He briefly took anticoagulation in 2018 however this was stopped due to hematuria secondary to nephrolithiasis.  He had altered mental status in October 2019, MRI obtained on 12/17/2017 showed acute right posterior temporal and parietal white matter infarct.  Eliquis was added at that time.  Patient was evaluated by Dr. Rayann Heman in November 2019, initial plan was to consider atrial flutter ablation followed by implantable loop recorder to monitor for asymptomatic atrial fibrillation given the stroke.  However the procedure was later canceled. He was last seen by Kerin Ransom on 05/15/2018 at which  time he was doing well however occasionally had PACs which he could be symptomatic with.  Patient presents today for cardiology office visit.  He still occasionally have palpitation that only last a few seconds up to a minute, otherwise he has been doing well without any exertional chest pain or shortness of breath.  He has been getting his medication from Dr. Inda Merlin including the Eliquis.  He has no lower extremity edema, orthopnea or PND.  Overall he has been doing well from cardiology perspective and can follow-up in 1 year.  The patient does not have symptoms concerning for COVID-19 infection (fever, chills, cough, or new shortness of breath).    Past Medical History:  Diagnosis Date  . Bronchitis    hx of   . CHF (congestive heart failure) (HCC)    EF 45%  . 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 GERD (gastroesophageal reflux disease)   . Hypertensive cardiovascular disease   . Kidney stones   . Sleep apnea    USES CPAP  . Typical atrial flutter ([place]) 10/2016   Past Surgical History:  Procedure Laterality Date  . colonscopy     . CYSTOSCOPY WITH RETROGRADE PYELOGRAM, URETEROSCOPY AND STENT PLACEMENT Right 08/01/2014   Procedure: CYSTOSCOPY WITH RIGHT  RETROGRADE PYELOGRAM/RIGHT URETEROSCOPY Cross ;  Surgeon: Festus Aloe, MD;  Location: [facility];  Service: Urology;  Laterality: Right;  . URETEROSCOPY WITH HOLMIUM LASER LITHOTRIPSY Right 11/18/2016   Procedure: URETEROSCOPY WITH HOLMIUM LASER LITHOTRIPSY/STENT PLACEMENT;  Surgeon: Festus Aloe, MD;  Location: [facility];  Service: Urology;  Laterality: Right;     Current Meds  Medication Sig  . acetaminophen (TYLENOL) 650 MG CR tablet Take 650 mg by mouth 2 (two) times daily as needed for pain.  Marland Kitchen albuterol (PROVENTIL HFA;VENTOLIN HFA) 108 (90 BASE) MCG/ACT inhaler Inhale 1-2 puffs into the lungs every 6 (six) hours as needed for wheezing.  Marland Kitchen apixaban (ELIQUIS) 5 MG TABS tablet Take  1 tablet (5 mg total) by mouth every 12 (twelve) hours. Additional refills by PCP, Neurology, or Cardiology (Patient taking differently: Take 5 mg by mouth 2 (two) times daily. )  . Bee Pollen 580 MG CAPS Take 1,160 mg by mouth daily.   . budesonide-formoterol (SYMBICORT) 160-4.5 MCG/ACT inhaler Inhale 2 puffs into the lungs every other day.   . Cholecalciferol (VITAMIN D) 2000 units tablet Take 2,000 Units by mouth daily.  Marland Kitchen ezetimibe (ZETIA) 10 MG tablet Take 10 mg by mouth daily.  . fluticasone (FLONASE) 50 MCG/ACT nasal spray Place 1 spray into both nostrils daily.  . hydrOXYzine (ATARAX/VISTARIL) 25 MG tablet Take 1 tablet (25 mg total) by mouth every 6 (six) hours.  Marland Kitchen losartan (COZAAR) 100 MG tablet Take 100 mg by mouth daily.     Allergies:   Allopurinol, Aspirin, Prednisone, Rosuvastatin, Simvastatin, and Tuberculin tests   Social History   Tobacco Use  . Smoking status: Never Smoker  . Smokeless tobacco: Never Used  Substance Use Topics  . Alcohol use: No  . Drug use: No     Family Hx: The patient's family history includes Alcohol abuse in his father; COPD in his father; Cerebrovascular Disease in his father; Diabetes in his father; Healthy in his child; Hypertension in his father and mother.  ROS:   Please see the history of present illness.     All other systems reviewed and are negative.   Prior CV studies:   The following studies were reviewed today:  Echo 12/15/2017 Study Conclusions   - Left ventricle: The cavity size was normal. Wall thickness was  increased in a pattern of mild LVH. Systolic function was normal.  The estimated ejection fraction was in the range of 55% to 60%.  Wall motion was normal; there were no regional wall motion  abnormalities. Doppler parameters are consistent with abnormal  left ventricular relaxation (grade 1 diastolic dysfunction).   Impressions:   - Normal LV systolic function; mild LVH; mild diastolic    dysfunction.   Labs/Other Tests and Data Reviewed:    EKG:  An ECG dated 05/15/2018 was personally reviewed today and demonstrated:  sinus rhythm with PACs  Recent Labs: No results found for requested labs within last 8760 hours.   Recent Lipid Panel Lab Results  Component Value Date/Time   CHOL 181 12/18/2017 03:39 AM   TRIG 119 12/18/2017 03:39 AM   HDL 30 (L) 12/18/2017 03:39 AM   CHOLHDL 6.0 12/18/2017 03:39 AM   LDLCALC 127 (H) 12/18/2017 03:39 AM    Wt Readings from Last 3 Encounters:  01/01/19 250 lb 12.8 oz (113.8 kg)  05/15/18 252 lb 6.4 oz (114.5 kg)  05/01/18 252 lb (114.3 kg)     Objective:    Vital Signs:  There were no vitals taken for this visit.   VITAL SIGNS:  reviewed  ASSESSMENT & PLAN:    1. Paroxysmal atrial flutter: He was initially set up for atrial flutter ablation by [name] in November 2019, however he did not go through with the procedure.  He has since been able to  maintain sinus rhythm without significant issue.  He is not on any AV nodal blocking agent due to baseline heart rate.  Otherwise he has been compliant with Eliquis  2. Hypertension: Continue losartan.  Lab work has been ordered and followed by primary care provider.  3. History of CVA: Occurred in October 2019.  He has been on Eliquis since then.  4. Obstructive sleep apnea: 100% compliance with CPAP therapy  5. CKD stage III: Followed by nephrology service, unfortunately patient does not remember the name of his nephrologist.   COVID-19 Education: The signs and symptoms of COVID-19 were discussed with the patient and how to seek care for testing (follow up with PCP or arrange E-visit).  The importance of social distancing was discussed today.  Time:   Today, I have spent 9 minutes with the patient with telehealth technology discussing the above problems.     Medication Adjustments/Labs and Tests Ordered: Current medicines are reviewed at length with the patient today.   Concerns regarding medicines are outlined above.   Tests Ordered: No orders of the defined types were placed in this encounter.   Medication Changes: No orders of the defined types were placed in this encounter.   Follow Up:  Either In Person or Virtual in 1 year(s)  Signed, Azalee Course, PA  05/24/2019 11:19 PM    [facility]

## 2019-05-22 NOTE — Patient Instructions (Signed)
Medication Instructions:  °Your physician recommends that you continue on your current medications as directed. Please refer to the Current Medication list given to you today. ° °*If you need a refill on your cardiac medications before your next appointment, please call your pharmacy* ° °Lab Work: °NONE ordered at this time of appointment  ° °If you have labs (blood work) drawn today and your tests are completely normal, you will receive your results only by: °MyChart Message (if you have MyChart) OR °A paper copy in the mail °If you have any lab test that is abnormal or we need to change your treatment, we will call you to review the results. ° °Testing/Procedures: °NONE ordered at this time of appointment  ° °Follow-Up: °At [facility], you and your health needs are our priority.  As part of our continuing mission to provide you with exceptional heart care, we have created designated Provider Care Teams.  These Care Teams include your primary Cardiologist (physician) and Advanced Practice Providers (APPs -  Physician Assistants and Nurse Practitioners) who all work together to provide you with the care you need, when you need it. ° °We recommend signing up for the patient portal called "MyChart".  Sign up information is provided on this After Visit Summary.  MyChart is used to connect with patients for Virtual Visits (Telemedicine).  Patients are able to view lab/test results, encounter notes, upcoming appointments, etc.  Non-urgent messages can be sent to your provider as well.   °To learn more about what you can do with MyChart, go to [email or web address].   ° °Your next appointment:   °1 year(s) ° °The format for your next appointment:   °In Person ° °Provider:   °Mihai Croitoru, MD   ° °Other Instructions ° ° °

## 2019-05-22 NOTE — Telephone Encounter (Signed)
  Patient Consent for Virtual Visit         Darrell Perkins has provided verbal consent on 05/22/2019 for a virtual visit (video or telephone).   CONSENT FOR VIRTUAL VISIT FOR:  Darrell Perkins  By participating in this virtual visit I agree to the following:  I hereby voluntarily request, consent and authorize [facility] and its employed or contracted physicians, physician assistants, nurse practitioners or other licensed health care professionals (the Practitioner), to provide me with telemedicine health care services (the "Services") as deemed necessary by the treating Practitioner. I acknowledge and consent to receive the Services by the Practitioner via telemedicine. I understand that the telemedicine visit will involve communicating with the Practitioner through live audiovisual communication technology and the disclosure of certain medical information by electronic transmission. I acknowledge that I have been given the opportunity to request an in-person assessment or other available alternative prior to the telemedicine visit and am voluntarily participating in the telemedicine visit.  I understand that I have the right to withhold or withdraw my consent to the use of telemedicine in the course of my care at any time, without affecting my right to future care or treatment, and that the Practitioner or I may terminate the telemedicine visit at any time. I understand that I have the right to inspect all information obtained and/or recorded in the course of the telemedicine visit and may receive copies of available information for a reasonable fee.  I understand that some of the potential risks of receiving the Services via telemedicine include:  Marland Kitchen Delay or interruption in medical evaluation due to technological equipment failure or disruption; . Information transmitted may not be sufficient (e.g. poor resolution of images) to allow for appropriate medical decision making by the Practitioner; and/or    . In rare instances, security protocols could fail, causing a breach of personal health information.  Furthermore, I acknowledge that it is my responsibility to provide information about my medical history, conditions and care that is complete and accurate to the best of my ability. I acknowledge that Practitioner's advice, recommendations, and/or decision may be based on factors not within their control, such as incomplete or inaccurate data provided by me or distortions of diagnostic images or specimens that may result from electronic transmissions. I understand that the practice of medicine is not an exact science and that Practitioner makes no warranties or guarantees regarding treatment outcomes. I acknowledge that a copy of this consent can be made available to me via my patient portal [facility] MyChart), or I can request a printed copy by calling the office of [facility].    I understand that my insurance will be billed for this visit.   I have read or had this consent read to me. . I understand the contents of this consent, which adequately explains the benefits and risks of the Services being provided via telemedicine.  . I have been provided ample opportunity to ask questions regarding this consent and the Services and have had my questions answered to my satisfaction. . I give my informed consent for the services to be provided through the use of telemedicine in my medical care

## 2019-05-22 NOTE — Telephone Encounter (Signed)
  Patient Consent for Virtual Visit         Darrell Perkins has provided verbal consent on 05/22/2019 for a virtual visit (video or telephone).   CONSENT FOR VIRTUAL VISIT FOR:  Darrell Perkins  By participating in this virtual visit I agree to the following:  I hereby voluntarily request, consent and authorize [facility] and its employed or contracted physicians, physician assistants, nurse practitioners or other licensed health care professionals (the Practitioner), to provide me with telemedicine health care services (the "Services") as deemed necessary by the treating Practitioner. I acknowledge and consent to receive the Services by the Practitioner via telemedicine. I understand that the telemedicine visit will involve communicating with the Practitioner through live audiovisual communication technology and the disclosure of certain medical information by electronic transmission. I acknowledge that I have been given the opportunity to request an in-person assessment or other available alternative prior to the telemedicine visit and am voluntarily participating in the telemedicine visit.  I understand that I have the right to withhold or withdraw my consent to the use of telemedicine in the course of my care at any time, without affecting my right to future care or treatment, and that the Practitioner or I may terminate the telemedicine visit at any time. I understand that I have the right to inspect all information obtained and/or recorded in the course of the telemedicine visit and may receive copies of available information for a reasonable fee.  I understand that some of the potential risks of receiving the Services via telemedicine include:  . Delay or interruption in medical evaluation due to technological equipment failure or disruption; . Information transmitted may not be sufficient (e.g. poor resolution of images) to allow for appropriate medical decision making by the Practitioner; and/or    . In rare instances, security protocols could fail, causing a breach of personal health information.  Furthermore, I acknowledge that it is my responsibility to provide information about my medical history, conditions and care that is complete and accurate to the best of my ability. I acknowledge that Practitioner's advice, recommendations, and/or decision may be based on factors not within their control, such as incomplete or inaccurate data provided by me or distortions of diagnostic images or specimens that may result from electronic transmissions. I understand that the practice of medicine is not an exact science and that Practitioner makes no warranties or guarantees regarding treatment outcomes. I acknowledge that a copy of this consent can be made available to me via my patient portal ([facility] MyChart), or I can request a printed copy by calling the office of [facility].    I understand that my insurance will be billed for this visit.   I have read or had this consent read to me. . I understand the contents of this consent, which adequately explains the benefits and risks of the Services being provided via telemedicine.  . I have been provided ample opportunity to ask questions regarding this consent and the Services and have had my questions answered to my satisfaction. . I give my informed consent for the services to be provided through the use of telemedicine in my medical care    

## 2019-05-22 NOTE — Telephone Encounter (Signed)
Called patient to discuss AVS instructions gave Darrell Perkins's recommendations and patient voiced understanding. AVS summary mailed to patient.    

## 2019-05-23 ENCOUNTER — Ambulatory Visit: Payer: Medicare HMO | Attending: Internal Medicine

## 2019-05-23 DIAGNOSIS — Z23 Encounter for immunization: Secondary | ICD-10-CM

## 2019-05-23 NOTE — Progress Notes (Signed)
   Covid-19 Vaccination Clinic  Name:  SAAHIL HERBSTER    MRN: [id number] DOB: 12-21-1950  05/23/2019  Mr. Hettinger was observed post Covid-19 immunization for 15 minutes without incident. He was provided with Vaccine Information Sheet and instruction to access the V-Safe system.   Mr. Bellard was instructed to call 911 with any severe reactions post vaccine: Marland Kitchen Difficulty breathing  . Swelling of face and throat  . A fast heartbeat  . A bad rash all over body  . Dizziness and weakness   Immunizations Administered    Name Date Dose VIS Date Route   Pfizer COVID-19 Vaccine 05/23/2019  8:49 AM 0.3 mL 02/15/2019 Intramuscular   Manufacturer: [employer], [employer]   Lot: [id number]   NDC: [address]-4

## 2019-06-04 DIAGNOSIS — G4733 Obstructive sleep apnea (adult) (pediatric): Secondary | ICD-10-CM | POA: Diagnosis not present

## 2019-06-17 ENCOUNTER — Ambulatory Visit: Payer: Medicare HMO | Attending: Internal Medicine

## 2019-06-17 DIAGNOSIS — Z23 Encounter for immunization: Secondary | ICD-10-CM

## 2019-06-17 NOTE — Progress Notes (Signed)
   Covid-19 Vaccination Clinic  Name:  GERALD KUEHL    MRN: [id number] DOB: 1950/05/03  06/17/2019  Mr. Boruff was observed post Covid-19 immunization for 15 minutes without incident. He was provided with Vaccine Information Sheet and instruction to access the V-Safe system.   Mr. Powe was instructed to call 911 with any severe reactions post vaccine: Marland Kitchen Difficulty breathing  . Swelling of face and throat  . A fast heartbeat  . A bad rash all over body  . Dizziness and weakness   Immunizations Administered    Name Date Dose VIS Date Route   Pfizer COVID-19 Vaccine 06/17/2019  8:47 AM 0.3 mL 02/15/2019 Intramuscular   Manufacturer: [employer], [employer]   Lot: [id number]   NDC: [address]-1

## 2019-06-18 DIAGNOSIS — G4733 Obstructive sleep apnea (adult) (pediatric): Secondary | ICD-10-CM | POA: Diagnosis not present

## 2019-06-24 ENCOUNTER — Telehealth: Payer: Self-pay

## 2019-06-24 DIAGNOSIS — Z8679 Personal history of other diseases of the circulatory system: Secondary | ICD-10-CM | POA: Diagnosis not present

## 2019-06-24 DIAGNOSIS — Z86010 Personal history of colonic polyps: Secondary | ICD-10-CM | POA: Diagnosis not present

## 2019-06-24 NOTE — Telephone Encounter (Signed)
   [facility] Pre-operative Risk Assessment    Request for surgical clearance:  1. What type of surgery is being performed? colonoscopy  2. When is this surgery scheduled? tbd In May  3. What type of clearance is required (medical clearance vs. Pharmacy clearance to hold med vs. Both)? both  4. Are there any medications that need to be held prior to surgery and how long? eliquis   5. Practice name and name of physician performing surgery? [facility]   6. What is your office phone number [phone]    7.   What is your office fax number [phone]  8.   Anesthesia type (None, local, MAC, general) ? propofol   Darrell Perkins 06/24/2019, 4:15 PM  _________________________________________________________________   (provider comments below)

## 2019-06-25 NOTE — Telephone Encounter (Signed)
Patient with diagnosis of afib on Eliquis for anticoagulation.    Procedure: colonscopy Date of procedure: May - TBD  CHADS2-VASc score of  5 (age, CHF, HTN, CVA)  CrCl 68mL/min using adjusted body weight Platelet count 286  Recommend holding Eliquis for 24 hours prior to procedure. Resume as soon as safely possible due to elevated cardiac risk with hx of afib and CVA.

## 2019-06-25 NOTE — Telephone Encounter (Signed)
   Primary Cardiologist: Thurmon Fair, MD  Chart reviewed as part of pre-operative protocol coverage. Given past medical history and time since last visit, based on ACC/AHA guidelines, KREIG PARSON would be at acceptable risk for the planned procedure without further cardiovascular testing.   Patient with diagnosis of afib on Eliquis for anticoagulation.    Procedure: colonscopy Date of procedure: May - TBD  CHADS2-VASc score of  5 (age, CHF, HTN, CVA)  CrCl 45mL/min using adjusted body weight Platelet count 286  Recommend holding Eliquis for 24 hours prior to procedure. Resume as soon as safely possible due to elevated cardiac risk with hx of afib and CVA.   I will route this recommendation to the requesting party via Epic fax function and remove from pre-op pool.  Please call with questions.  Thomasene Ripple. Jenya Putz NP-C        [facility] [address] Office [phone] Fax [phone]

## 2019-10-14 IMAGING — CR DG CHEST 2V
2 series · 2 of 2 positions shown · non-contrast
Comparison: 11/02/2016

CLINICAL DATA: Shortness of breath

EXAM:
CHEST - 2 VIEW

[chest pa]
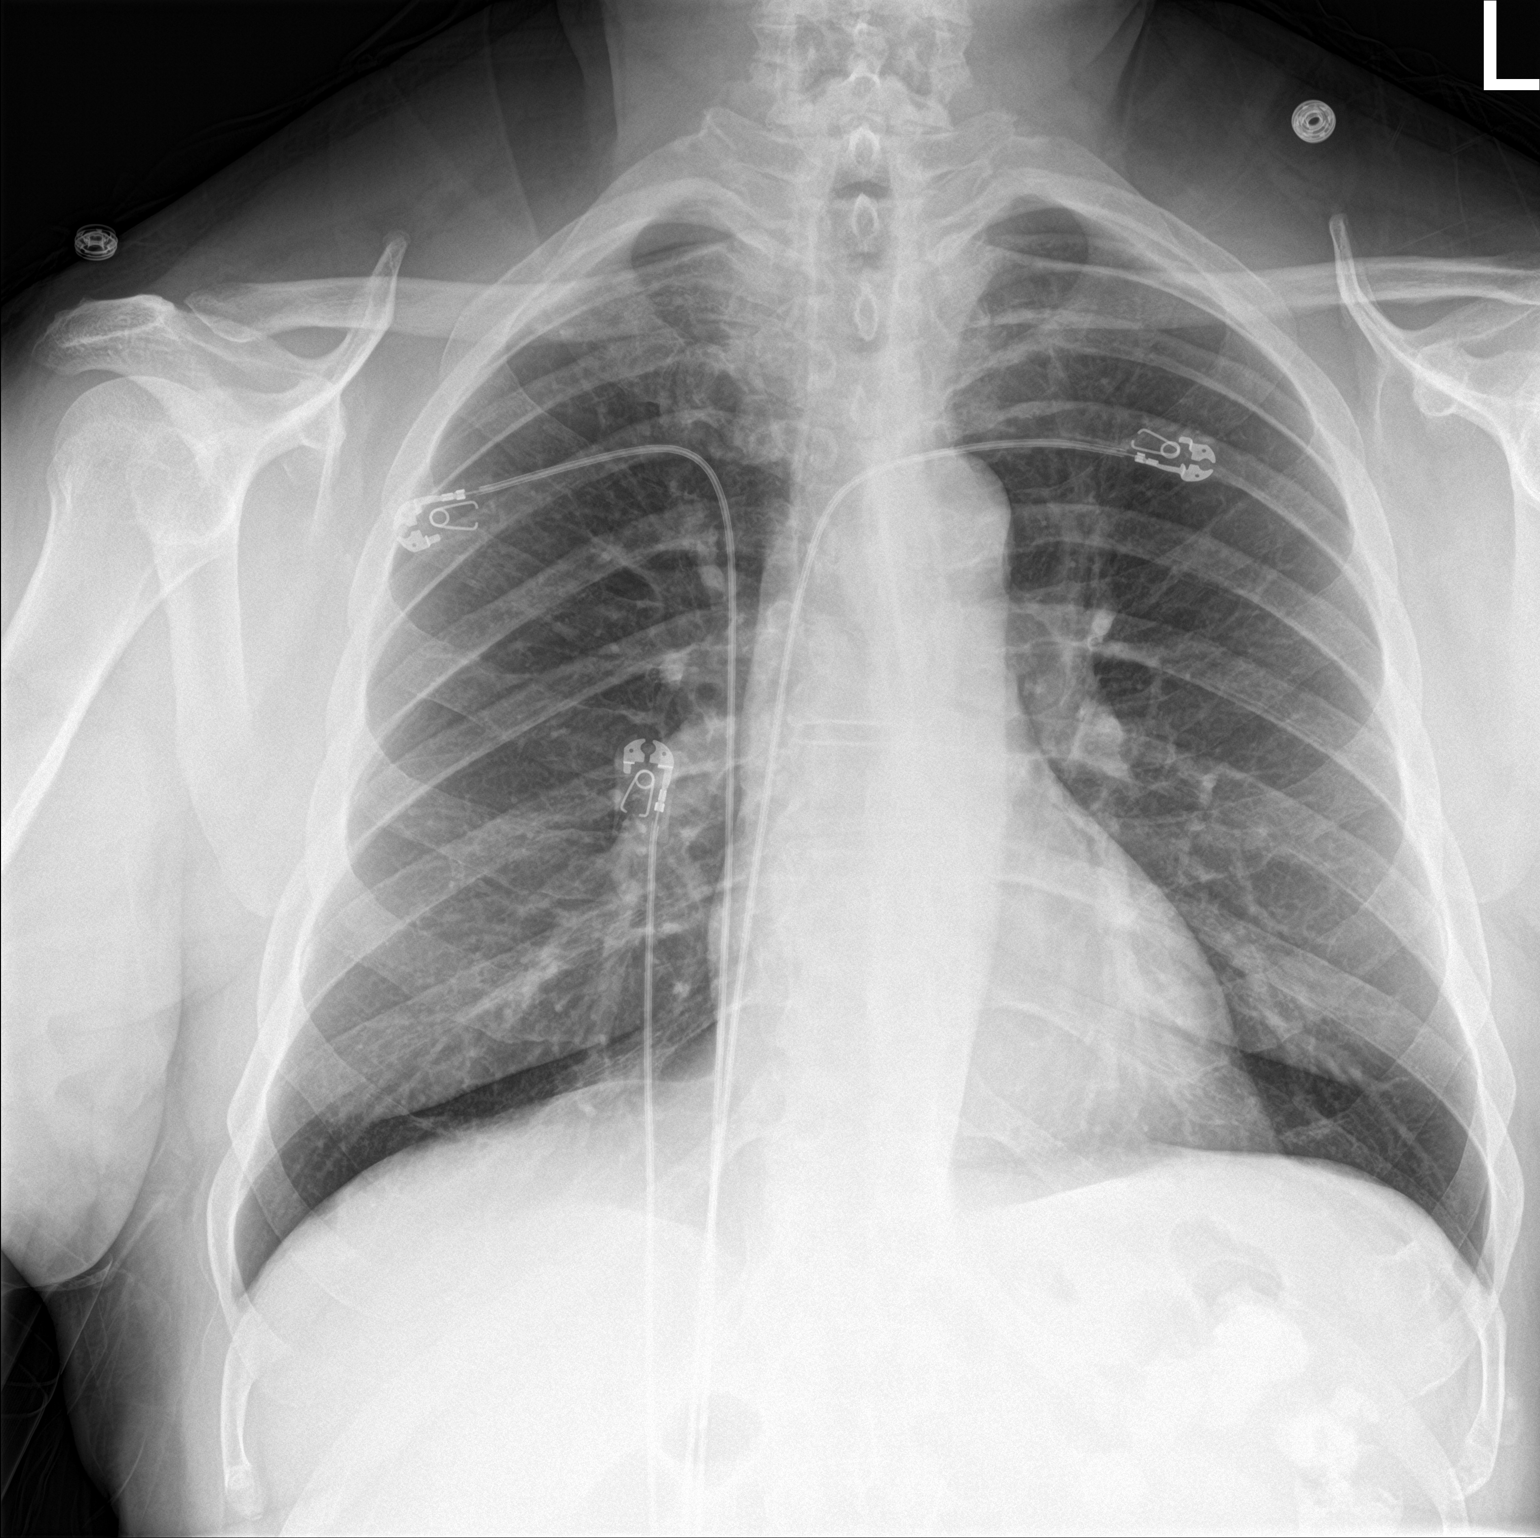

[chest lat]
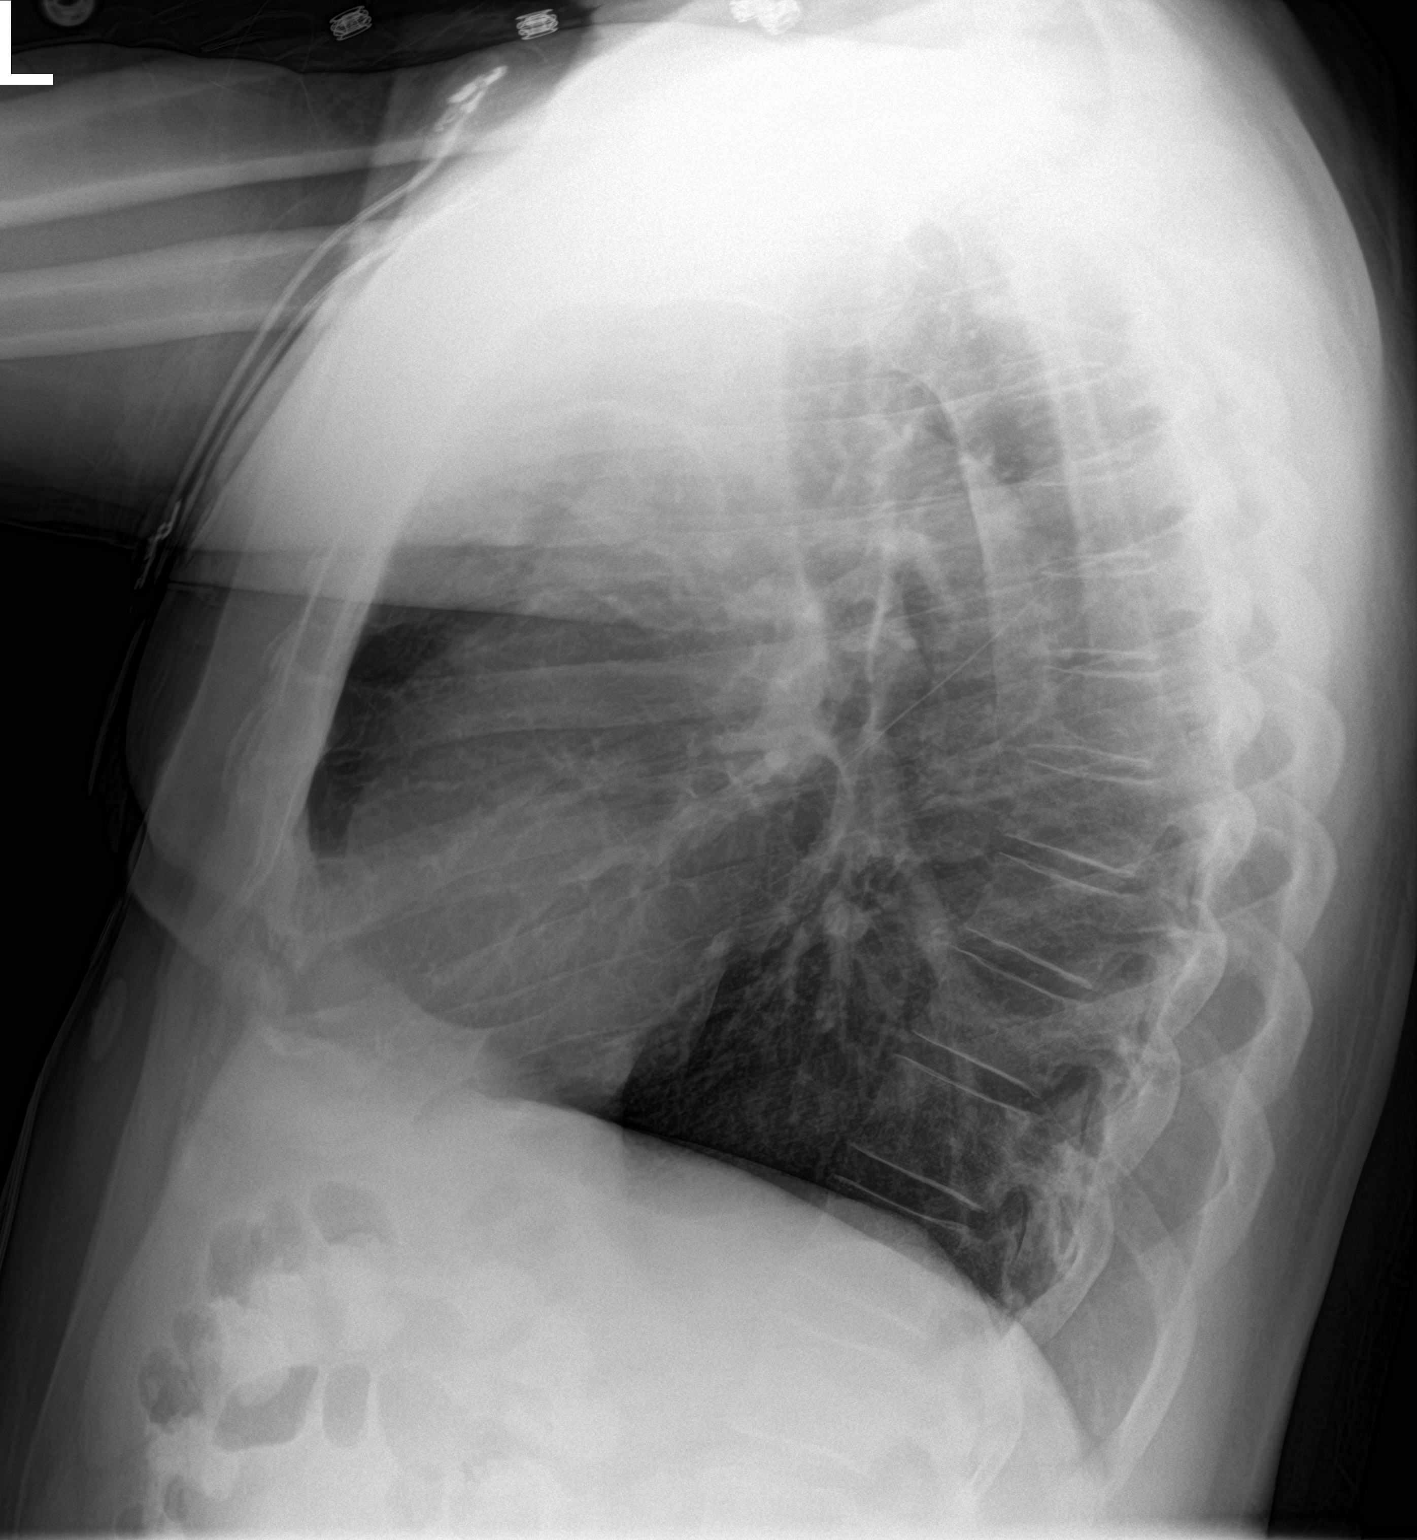

[2 of 2 positions shown; findings below may reference images not displayed]

FINDINGS: The heart size and mediastinal contours are within normal limits.
Both lungs are clear. The visualized skeletal structures are
unremarkable.
IMPRESSION: No active cardiopulmonary disease.

## 2019-10-15 IMAGING — MR MR MRA HEAD W/O CM
1 series · 20 of 48 positions shown · non-contrast
Comparison: Brain MRI 12/17/2017

CLINICAL DATA: Stroke follow-up

EXAM:
MRA HEAD WITHOUT CONTRAST
TECHNIQUE: Angiographic images of the Circle of Willis were obtained using MRA
technique without intravenous contrast.

[Series 3: ax (id) 2 · axial · 1.0mm · 0.43mm/px · z∈[-38,+49]mm · 20 of 184 slices shown]
[im 1/184]
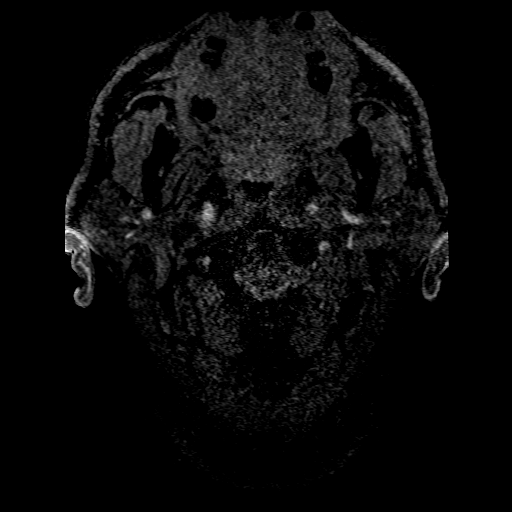
[im 4/184]
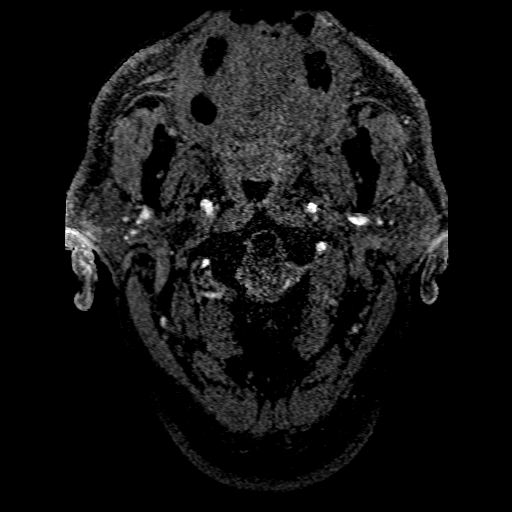
[im 8/184]
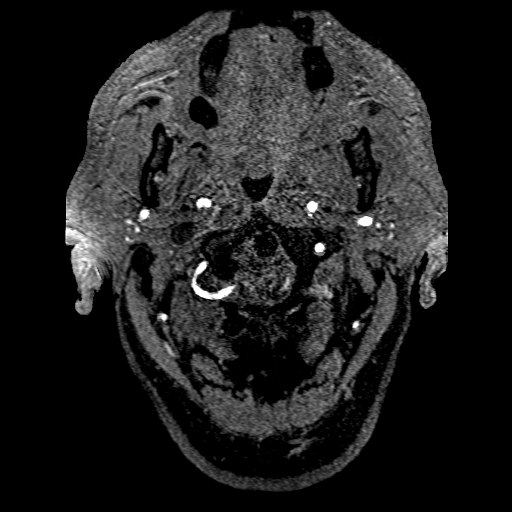
[im 12/184]
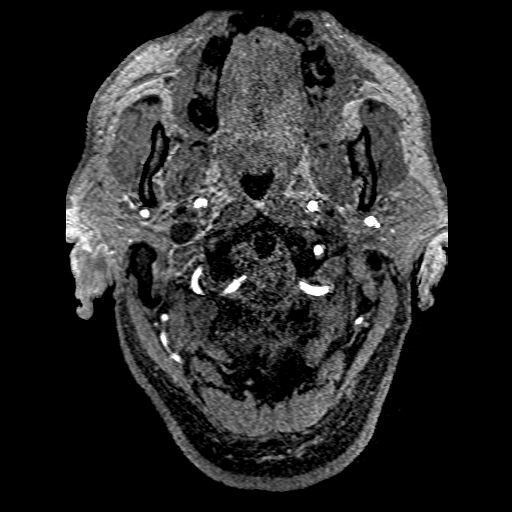
[im 16/184]
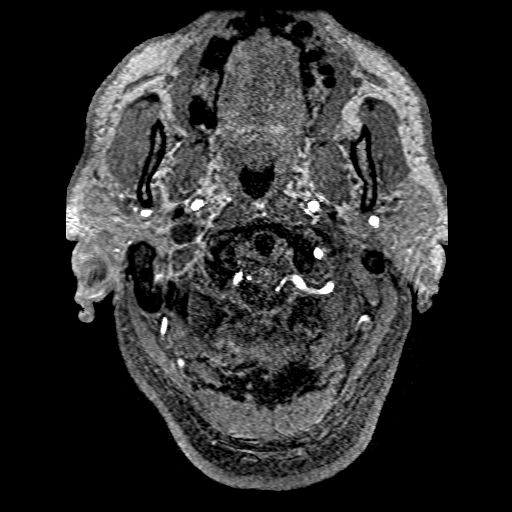
[im 20/184]
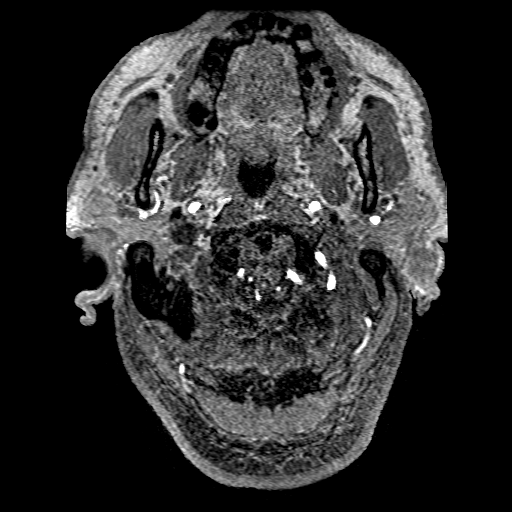
[im 24/184]
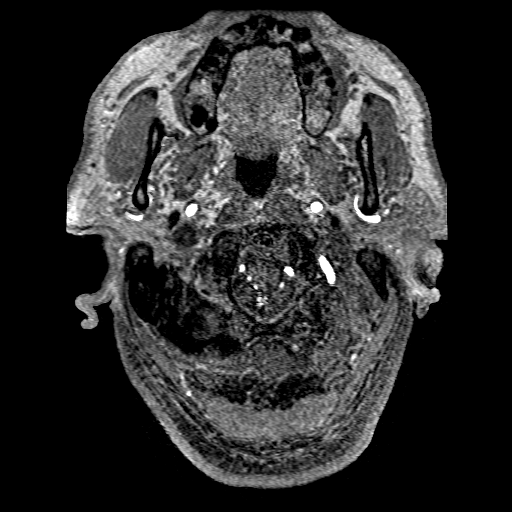
[im 28/184]
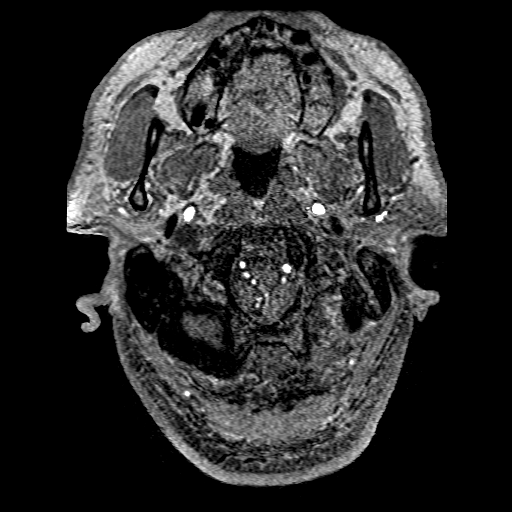
[im 32/184]
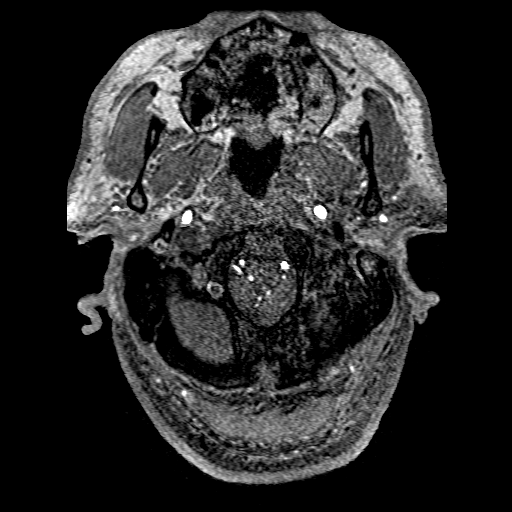
[im 36/184]
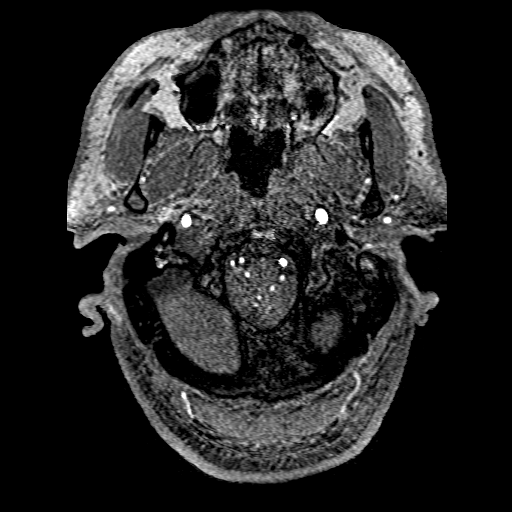
[im 39/184]
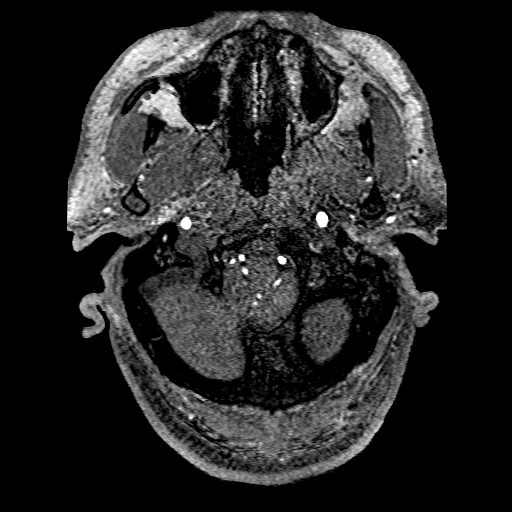
[im 43/184]
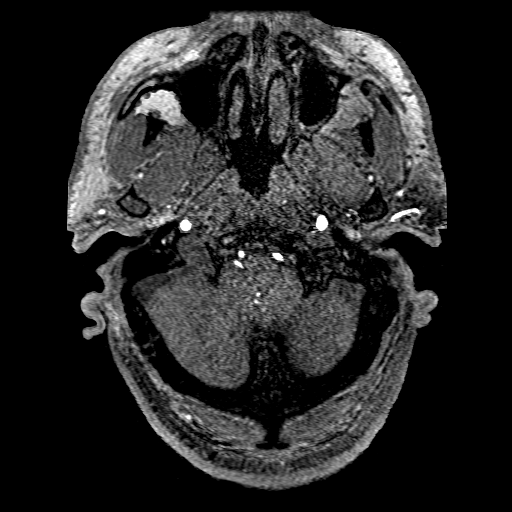
[im 59/184]
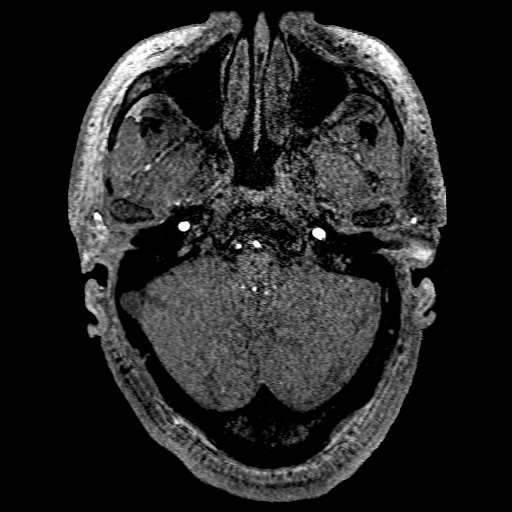
[im 82/184]
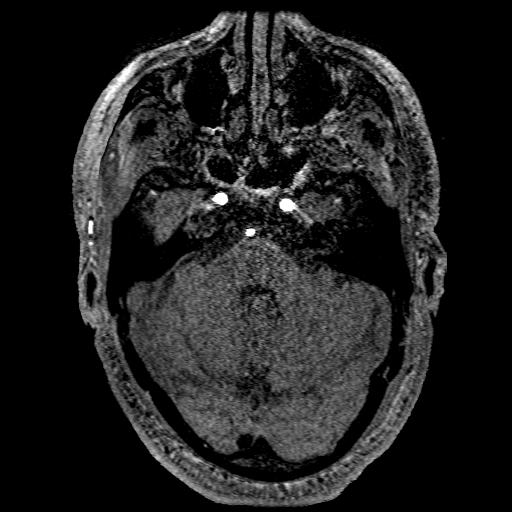
[im 94/184]
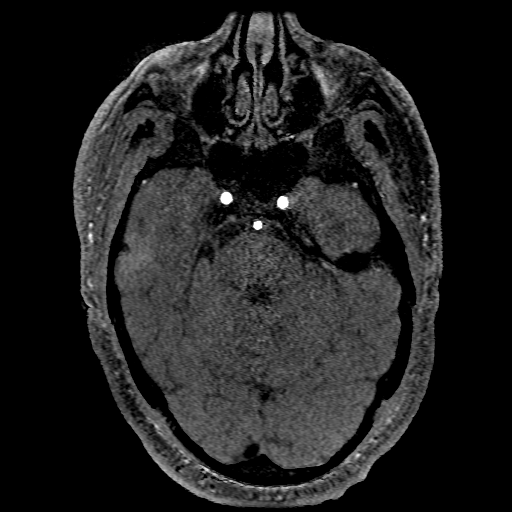
[im 106/184]
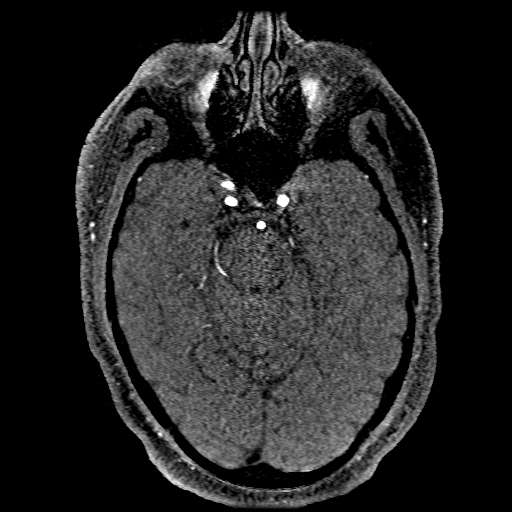
[im 129/184]
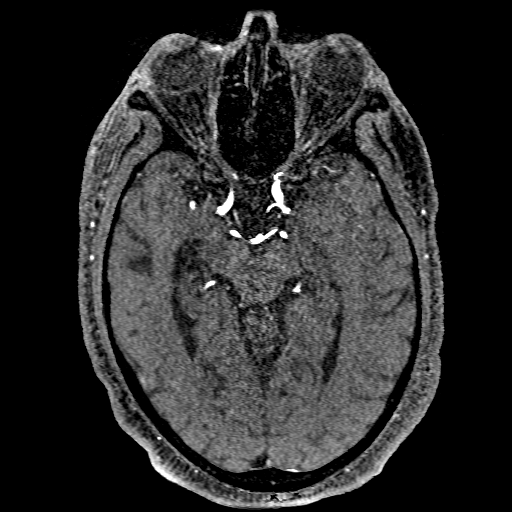
[im 152/184]
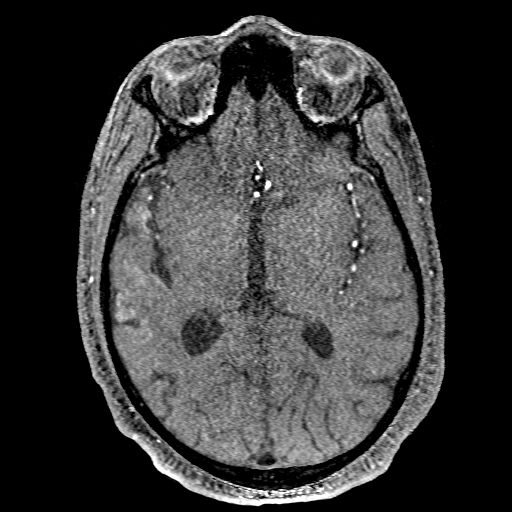
[im 156/184]
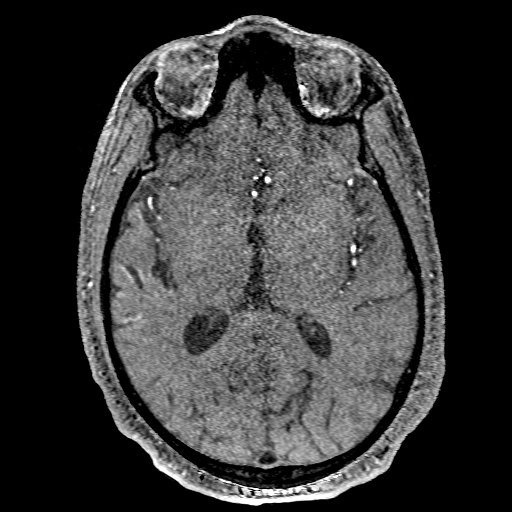
[im 176/184]
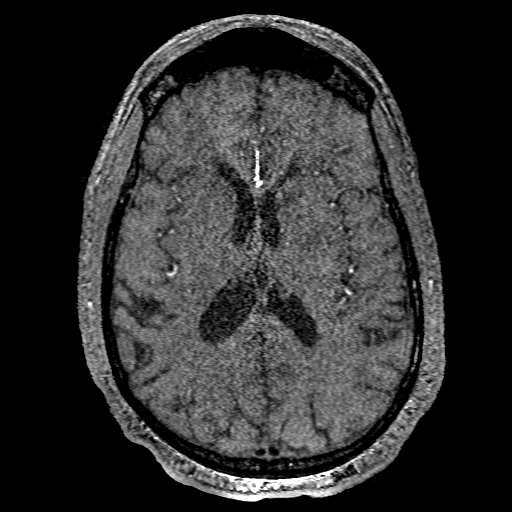

[20 of 48 positions shown; findings below may reference images not displayed]

FINDINGS: ANTERIOR CIRCULATION:

--Intracranial internal carotid arteries: Normal.

--Anterior cerebral arteries: Normal. Both A1 segments are present.
Patent anterior communicating artery.

--Middle cerebral arteries: Normal.

--Posterior communicating arteries: Present bilaterally.

POSTERIOR CIRCULATION:

--Basilar artery: Normal.

--Posterior cerebral arteries: Normal.

--Superior cerebellar arteries: Normal.

--Inferior cerebellar arteries: Normal anterior and posterior
inferior cerebellar arteries.
IMPRESSION: Normal intracranial MRA.

## 2020-04-06 DIAGNOSIS — D6869 Other thrombophilia: Secondary | ICD-10-CM | POA: Diagnosis not present

## 2020-04-06 DIAGNOSIS — E782 Mixed hyperlipidemia: Secondary | ICD-10-CM | POA: Diagnosis not present

## 2020-04-06 DIAGNOSIS — I129 Hypertensive chronic kidney disease with stage 1 through stage 4 chronic kidney disease, or unspecified chronic kidney disease: Secondary | ICD-10-CM | POA: Diagnosis not present

## 2020-04-06 DIAGNOSIS — I10 Essential (primary) hypertension: Secondary | ICD-10-CM | POA: Diagnosis not present

## 2020-04-06 DIAGNOSIS — I4891 Unspecified atrial fibrillation: Secondary | ICD-10-CM | POA: Diagnosis not present

## 2020-04-06 DIAGNOSIS — M170 Bilateral primary osteoarthritis of knee: Secondary | ICD-10-CM | POA: Diagnosis not present

## 2020-04-06 DIAGNOSIS — Z8673 Personal history of transient ischemic attack (TIA), and cerebral infarction without residual deficits: Secondary | ICD-10-CM | POA: Diagnosis not present

## 2020-04-06 DIAGNOSIS — Z0001 Encounter for general adult medical examination with abnormal findings: Secondary | ICD-10-CM | POA: Diagnosis not present

## 2020-04-06 DIAGNOSIS — J45909 Unspecified asthma, uncomplicated: Secondary | ICD-10-CM | POA: Diagnosis not present

## 2020-04-08 DIAGNOSIS — E782 Mixed hyperlipidemia: Secondary | ICD-10-CM | POA: Diagnosis not present

## 2020-04-08 DIAGNOSIS — N183 Chronic kidney disease, stage 3 unspecified: Secondary | ICD-10-CM | POA: Diagnosis not present

## 2020-04-08 DIAGNOSIS — I10 Essential (primary) hypertension: Secondary | ICD-10-CM | POA: Diagnosis not present

## 2020-04-08 DIAGNOSIS — I129 Hypertensive chronic kidney disease with stage 1 through stage 4 chronic kidney disease, or unspecified chronic kidney disease: Secondary | ICD-10-CM | POA: Diagnosis not present

## 2020-04-08 DIAGNOSIS — J40 Bronchitis, not specified as acute or chronic: Secondary | ICD-10-CM | POA: Diagnosis not present

## 2020-04-08 DIAGNOSIS — J45909 Unspecified asthma, uncomplicated: Secondary | ICD-10-CM | POA: Diagnosis not present

## 2020-04-08 DIAGNOSIS — I4891 Unspecified atrial fibrillation: Secondary | ICD-10-CM | POA: Diagnosis not present

## 2020-04-08 DIAGNOSIS — G4733 Obstructive sleep apnea (adult) (pediatric): Secondary | ICD-10-CM | POA: Diagnosis not present

## 2020-04-29 DIAGNOSIS — E7800 Pure hypercholesterolemia, unspecified: Secondary | ICD-10-CM | POA: Diagnosis not present

## 2020-04-29 DIAGNOSIS — Z8673 Personal history of transient ischemic attack (TIA), and cerebral infarction without residual deficits: Secondary | ICD-10-CM | POA: Diagnosis not present

## 2020-05-18 DIAGNOSIS — H2513 Age-related nuclear cataract, bilateral: Secondary | ICD-10-CM | POA: Diagnosis not present

## 2020-06-16 DIAGNOSIS — J45909 Unspecified asthma, uncomplicated: Secondary | ICD-10-CM | POA: Diagnosis not present

## 2020-06-16 DIAGNOSIS — I639 Cerebral infarction, unspecified: Secondary | ICD-10-CM | POA: Diagnosis not present

## 2020-06-16 DIAGNOSIS — I129 Hypertensive chronic kidney disease with stage 1 through stage 4 chronic kidney disease, or unspecified chronic kidney disease: Secondary | ICD-10-CM | POA: Diagnosis not present

## 2020-06-16 DIAGNOSIS — E782 Mixed hyperlipidemia: Secondary | ICD-10-CM | POA: Diagnosis not present

## 2020-06-16 DIAGNOSIS — I10 Essential (primary) hypertension: Secondary | ICD-10-CM | POA: Diagnosis not present

## 2020-06-16 DIAGNOSIS — M179 Osteoarthritis of knee, unspecified: Secondary | ICD-10-CM | POA: Diagnosis not present

## 2020-06-16 DIAGNOSIS — I4891 Unspecified atrial fibrillation: Secondary | ICD-10-CM | POA: Diagnosis not present

## 2020-06-16 DIAGNOSIS — J40 Bronchitis, not specified as acute or chronic: Secondary | ICD-10-CM | POA: Diagnosis not present

## 2020-07-10 DIAGNOSIS — G4733 Obstructive sleep apnea (adult) (pediatric): Secondary | ICD-10-CM | POA: Diagnosis not present

## 2020-07-20 DIAGNOSIS — E7800 Pure hypercholesterolemia, unspecified: Secondary | ICD-10-CM | POA: Diagnosis not present

## 2020-07-20 DIAGNOSIS — J45909 Unspecified asthma, uncomplicated: Secondary | ICD-10-CM | POA: Diagnosis not present

## 2020-07-20 DIAGNOSIS — I10 Essential (primary) hypertension: Secondary | ICD-10-CM | POA: Diagnosis not present

## 2020-07-20 DIAGNOSIS — I639 Cerebral infarction, unspecified: Secondary | ICD-10-CM | POA: Diagnosis not present

## 2020-07-20 DIAGNOSIS — E782 Mixed hyperlipidemia: Secondary | ICD-10-CM | POA: Diagnosis not present

## 2020-07-20 DIAGNOSIS — I129 Hypertensive chronic kidney disease with stage 1 through stage 4 chronic kidney disease, or unspecified chronic kidney disease: Secondary | ICD-10-CM | POA: Diagnosis not present

## 2020-07-20 DIAGNOSIS — J40 Bronchitis, not specified as acute or chronic: Secondary | ICD-10-CM | POA: Diagnosis not present

## 2020-07-20 DIAGNOSIS — I4891 Unspecified atrial fibrillation: Secondary | ICD-10-CM | POA: Diagnosis not present

## 2020-07-20 DIAGNOSIS — N183 Chronic kidney disease, stage 3 unspecified: Secondary | ICD-10-CM | POA: Diagnosis not present

## 2020-08-10 DIAGNOSIS — Z8673 Personal history of transient ischemic attack (TIA), and cerebral infarction without residual deficits: Secondary | ICD-10-CM | POA: Diagnosis not present

## 2020-08-10 DIAGNOSIS — R3129 Other microscopic hematuria: Secondary | ICD-10-CM | POA: Diagnosis not present

## 2020-08-10 DIAGNOSIS — I4891 Unspecified atrial fibrillation: Secondary | ICD-10-CM | POA: Diagnosis not present

## 2020-08-10 DIAGNOSIS — I129 Hypertensive chronic kidney disease with stage 1 through stage 4 chronic kidney disease, or unspecified chronic kidney disease: Secondary | ICD-10-CM | POA: Diagnosis not present

## 2020-08-10 DIAGNOSIS — M170 Bilateral primary osteoarthritis of knee: Secondary | ICD-10-CM | POA: Diagnosis not present

## 2020-08-10 DIAGNOSIS — D6869 Other thrombophilia: Secondary | ICD-10-CM | POA: Diagnosis not present

## 2020-08-20 DIAGNOSIS — N183 Chronic kidney disease, stage 3 unspecified: Secondary | ICD-10-CM | POA: Diagnosis not present

## 2020-08-20 DIAGNOSIS — I4891 Unspecified atrial fibrillation: Secondary | ICD-10-CM | POA: Diagnosis not present

## 2020-08-20 DIAGNOSIS — I10 Essential (primary) hypertension: Secondary | ICD-10-CM | POA: Diagnosis not present

## 2020-08-20 DIAGNOSIS — M170 Bilateral primary osteoarthritis of knee: Secondary | ICD-10-CM | POA: Diagnosis not present

## 2020-08-20 DIAGNOSIS — I639 Cerebral infarction, unspecified: Secondary | ICD-10-CM | POA: Diagnosis not present

## 2020-08-20 DIAGNOSIS — J40 Bronchitis, not specified as acute or chronic: Secondary | ICD-10-CM | POA: Diagnosis not present

## 2020-08-20 DIAGNOSIS — E782 Mixed hyperlipidemia: Secondary | ICD-10-CM | POA: Diagnosis not present

## 2020-08-20 DIAGNOSIS — J45909 Unspecified asthma, uncomplicated: Secondary | ICD-10-CM | POA: Diagnosis not present

## 2020-08-20 DIAGNOSIS — I129 Hypertensive chronic kidney disease with stage 1 through stage 4 chronic kidney disease, or unspecified chronic kidney disease: Secondary | ICD-10-CM | POA: Diagnosis not present

## 2020-08-20 DIAGNOSIS — E7800 Pure hypercholesterolemia, unspecified: Secondary | ICD-10-CM | POA: Diagnosis not present

## 2020-09-21 DIAGNOSIS — D6869 Other thrombophilia: Secondary | ICD-10-CM | POA: Diagnosis not present

## 2020-09-21 DIAGNOSIS — Z8679 Personal history of other diseases of the circulatory system: Secondary | ICD-10-CM | POA: Diagnosis not present

## 2020-09-21 DIAGNOSIS — I10 Essential (primary) hypertension: Secondary | ICD-10-CM | POA: Diagnosis not present

## 2020-09-21 DIAGNOSIS — M170 Bilateral primary osteoarthritis of knee: Secondary | ICD-10-CM | POA: Diagnosis not present

## 2020-09-21 DIAGNOSIS — I4891 Unspecified atrial fibrillation: Secondary | ICD-10-CM | POA: Diagnosis not present

## 2020-09-21 DIAGNOSIS — I129 Hypertensive chronic kidney disease with stage 1 through stage 4 chronic kidney disease, or unspecified chronic kidney disease: Secondary | ICD-10-CM | POA: Diagnosis not present

## 2020-09-21 DIAGNOSIS — Z8673 Personal history of transient ischemic attack (TIA), and cerebral infarction without residual deficits: Secondary | ICD-10-CM | POA: Diagnosis not present

## 2020-09-23 DIAGNOSIS — I129 Hypertensive chronic kidney disease with stage 1 through stage 4 chronic kidney disease, or unspecified chronic kidney disease: Secondary | ICD-10-CM | POA: Diagnosis not present

## 2020-09-23 DIAGNOSIS — E782 Mixed hyperlipidemia: Secondary | ICD-10-CM | POA: Diagnosis not present

## 2020-10-07 DIAGNOSIS — E782 Mixed hyperlipidemia: Secondary | ICD-10-CM | POA: Diagnosis not present

## 2020-10-07 DIAGNOSIS — I10 Essential (primary) hypertension: Secondary | ICD-10-CM | POA: Diagnosis not present

## 2020-10-07 DIAGNOSIS — I129 Hypertensive chronic kidney disease with stage 1 through stage 4 chronic kidney disease, or unspecified chronic kidney disease: Secondary | ICD-10-CM | POA: Diagnosis not present

## 2020-10-07 DIAGNOSIS — E7800 Pure hypercholesterolemia, unspecified: Secondary | ICD-10-CM | POA: Diagnosis not present

## 2020-10-07 DIAGNOSIS — J45909 Unspecified asthma, uncomplicated: Secondary | ICD-10-CM | POA: Diagnosis not present

## 2020-10-07 DIAGNOSIS — J40 Bronchitis, not specified as acute or chronic: Secondary | ICD-10-CM | POA: Diagnosis not present

## 2020-10-07 DIAGNOSIS — I4891 Unspecified atrial fibrillation: Secondary | ICD-10-CM | POA: Diagnosis not present

## 2020-10-28 DIAGNOSIS — G4733 Obstructive sleep apnea (adult) (pediatric): Secondary | ICD-10-CM | POA: Diagnosis not present

## 2020-12-15 DIAGNOSIS — M170 Bilateral primary osteoarthritis of knee: Secondary | ICD-10-CM | POA: Diagnosis not present

## 2020-12-31 DIAGNOSIS — Z20822 Contact with and (suspected) exposure to covid-19: Secondary | ICD-10-CM | POA: Diagnosis not present

## 2020-12-31 DIAGNOSIS — U071 COVID-19: Secondary | ICD-10-CM | POA: Diagnosis not present

## 2020-12-31 DIAGNOSIS — R051 Acute cough: Secondary | ICD-10-CM | POA: Diagnosis not present

## 2021-01-12 DIAGNOSIS — E782 Mixed hyperlipidemia: Secondary | ICD-10-CM | POA: Diagnosis not present

## 2021-01-12 DIAGNOSIS — J45909 Unspecified asthma, uncomplicated: Secondary | ICD-10-CM | POA: Diagnosis not present

## 2021-01-12 DIAGNOSIS — J40 Bronchitis, not specified as acute or chronic: Secondary | ICD-10-CM | POA: Diagnosis not present

## 2021-01-12 DIAGNOSIS — M170 Bilateral primary osteoarthritis of knee: Secondary | ICD-10-CM | POA: Diagnosis not present

## 2021-01-12 DIAGNOSIS — I639 Cerebral infarction, unspecified: Secondary | ICD-10-CM | POA: Diagnosis not present

## 2021-01-12 DIAGNOSIS — I10 Essential (primary) hypertension: Secondary | ICD-10-CM | POA: Diagnosis not present

## 2021-01-12 DIAGNOSIS — I129 Hypertensive chronic kidney disease with stage 1 through stage 4 chronic kidney disease, or unspecified chronic kidney disease: Secondary | ICD-10-CM | POA: Diagnosis not present

## 2021-01-12 DIAGNOSIS — I4891 Unspecified atrial fibrillation: Secondary | ICD-10-CM | POA: Diagnosis not present

## 2021-02-03 DIAGNOSIS — G4733 Obstructive sleep apnea (adult) (pediatric): Secondary | ICD-10-CM | POA: Diagnosis not present

## 2021-02-24 DIAGNOSIS — M1712 Unilateral primary osteoarthritis, left knee: Secondary | ICD-10-CM | POA: Diagnosis not present

## 2021-03-05 DIAGNOSIS — G4733 Obstructive sleep apnea (adult) (pediatric): Secondary | ICD-10-CM | POA: Diagnosis not present

## 2021-03-25 DIAGNOSIS — M1712 Unilateral primary osteoarthritis, left knee: Secondary | ICD-10-CM | POA: Diagnosis not present

## 2021-04-14 DIAGNOSIS — M25562 Pain in left knee: Secondary | ICD-10-CM | POA: Diagnosis not present

## 2021-04-14 DIAGNOSIS — M1712 Unilateral primary osteoarthritis, left knee: Secondary | ICD-10-CM | POA: Diagnosis not present

## 2021-04-28 DIAGNOSIS — I4891 Unspecified atrial fibrillation: Secondary | ICD-10-CM | POA: Diagnosis not present

## 2021-04-28 DIAGNOSIS — Z7189 Other specified counseling: Secondary | ICD-10-CM | POA: Diagnosis not present

## 2021-04-28 DIAGNOSIS — I129 Hypertensive chronic kidney disease with stage 1 through stage 4 chronic kidney disease, or unspecified chronic kidney disease: Secondary | ICD-10-CM | POA: Diagnosis not present

## 2021-04-28 DIAGNOSIS — D6869 Other thrombophilia: Secondary | ICD-10-CM | POA: Diagnosis not present

## 2021-04-28 DIAGNOSIS — E782 Mixed hyperlipidemia: Secondary | ICD-10-CM | POA: Diagnosis not present

## 2021-04-28 DIAGNOSIS — J45909 Unspecified asthma, uncomplicated: Secondary | ICD-10-CM | POA: Diagnosis not present

## 2021-04-28 DIAGNOSIS — I10 Essential (primary) hypertension: Secondary | ICD-10-CM | POA: Diagnosis not present

## 2021-04-28 DIAGNOSIS — Z1389 Encounter for screening for other disorder: Secondary | ICD-10-CM | POA: Diagnosis not present

## 2021-04-28 DIAGNOSIS — M170 Bilateral primary osteoarthritis of knee: Secondary | ICD-10-CM | POA: Diagnosis not present

## 2021-04-28 DIAGNOSIS — Z23 Encounter for immunization: Secondary | ICD-10-CM | POA: Diagnosis not present

## 2021-04-28 DIAGNOSIS — Z0000 Encounter for general adult medical examination without abnormal findings: Secondary | ICD-10-CM | POA: Diagnosis not present

## 2021-05-04 DIAGNOSIS — I10 Essential (primary) hypertension: Secondary | ICD-10-CM | POA: Diagnosis not present

## 2021-05-04 DIAGNOSIS — E782 Mixed hyperlipidemia: Secondary | ICD-10-CM | POA: Diagnosis not present

## 2021-05-10 DIAGNOSIS — H35373 Puckering of macula, bilateral: Secondary | ICD-10-CM | POA: Diagnosis not present

## 2021-05-12 DIAGNOSIS — M25562 Pain in left knee: Secondary | ICD-10-CM | POA: Diagnosis not present

## 2021-05-12 DIAGNOSIS — M1712 Unilateral primary osteoarthritis, left knee: Secondary | ICD-10-CM | POA: Diagnosis not present

## 2021-05-20 DIAGNOSIS — M1712 Unilateral primary osteoarthritis, left knee: Secondary | ICD-10-CM | POA: Diagnosis not present

## 2021-05-20 DIAGNOSIS — M25562 Pain in left knee: Secondary | ICD-10-CM | POA: Diagnosis not present

## 2021-05-26 DIAGNOSIS — M170 Bilateral primary osteoarthritis of knee: Secondary | ICD-10-CM | POA: Diagnosis not present

## 2021-05-26 DIAGNOSIS — I129 Hypertensive chronic kidney disease with stage 1 through stage 4 chronic kidney disease, or unspecified chronic kidney disease: Secondary | ICD-10-CM | POA: Diagnosis not present

## 2021-05-26 DIAGNOSIS — Z8673 Personal history of transient ischemic attack (TIA), and cerebral infarction without residual deficits: Secondary | ICD-10-CM | POA: Diagnosis not present

## 2021-05-26 DIAGNOSIS — I10 Essential (primary) hypertension: Secondary | ICD-10-CM | POA: Diagnosis not present

## 2021-05-27 DIAGNOSIS — M25562 Pain in left knee: Secondary | ICD-10-CM | POA: Diagnosis not present

## 2021-05-27 DIAGNOSIS — M1712 Unilateral primary osteoarthritis, left knee: Secondary | ICD-10-CM | POA: Diagnosis not present

## 2021-06-07 DIAGNOSIS — G4733 Obstructive sleep apnea (adult) (pediatric): Secondary | ICD-10-CM | POA: Diagnosis not present

## 2021-09-06 DIAGNOSIS — G4733 Obstructive sleep apnea (adult) (pediatric): Secondary | ICD-10-CM | POA: Diagnosis not present

## 2021-09-21 DIAGNOSIS — M25562 Pain in left knee: Secondary | ICD-10-CM | POA: Diagnosis not present

## 2021-09-21 DIAGNOSIS — G8929 Other chronic pain: Secondary | ICD-10-CM | POA: Diagnosis not present

## 2021-09-30 DIAGNOSIS — M25562 Pain in left knee: Secondary | ICD-10-CM | POA: Diagnosis not present

## 2021-10-11 ENCOUNTER — Ambulatory Visit
Admission: RE | Admit: 2021-10-11 | Discharge: 2021-10-11 | Disposition: A | Discharge status: Home / Self Care | Payer: Medicare Other | Source: Ambulatory Visit | Attending: Sports Medicine | Admitting: Sports Medicine

## 2021-10-11 ENCOUNTER — Other Ambulatory Visit: Payer: Self-pay | Admitting: Sports Medicine

## 2021-10-11 DIAGNOSIS — M25562 Pain in left knee: Secondary | ICD-10-CM

## 2021-12-09 DIAGNOSIS — G4733 Obstructive sleep apnea (adult) (pediatric): Secondary | ICD-10-CM | POA: Diagnosis not present

## 2022-01-06 DIAGNOSIS — M1712 Unilateral primary osteoarthritis, left knee: Secondary | ICD-10-CM | POA: Diagnosis not present

## 2022-02-10 DIAGNOSIS — I4891 Unspecified atrial fibrillation: Secondary | ICD-10-CM | POA: Diagnosis not present

## 2022-02-10 DIAGNOSIS — G8929 Other chronic pain: Secondary | ICD-10-CM | POA: Diagnosis not present

## 2022-02-10 DIAGNOSIS — E7800 Pure hypercholesterolemia, unspecified: Secondary | ICD-10-CM | POA: Diagnosis not present

## 2022-02-10 DIAGNOSIS — J45909 Unspecified asthma, uncomplicated: Secondary | ICD-10-CM | POA: Diagnosis not present

## 2022-02-10 DIAGNOSIS — I10 Essential (primary) hypertension: Secondary | ICD-10-CM | POA: Diagnosis not present

## 2022-02-10 DIAGNOSIS — I129 Hypertensive chronic kidney disease with stage 1 through stage 4 chronic kidney disease, or unspecified chronic kidney disease: Secondary | ICD-10-CM | POA: Diagnosis not present

## 2022-03-14 DIAGNOSIS — G4733 Obstructive sleep apnea (adult) (pediatric): Secondary | ICD-10-CM | POA: Diagnosis not present

## 2022-04-04 DIAGNOSIS — I4891 Unspecified atrial fibrillation: Secondary | ICD-10-CM | POA: Diagnosis not present

## 2022-04-04 DIAGNOSIS — M170 Bilateral primary osteoarthritis of knee: Secondary | ICD-10-CM | POA: Diagnosis not present

## 2022-04-04 DIAGNOSIS — E782 Mixed hyperlipidemia: Secondary | ICD-10-CM | POA: Diagnosis not present

## 2022-04-04 DIAGNOSIS — I10 Essential (primary) hypertension: Secondary | ICD-10-CM | POA: Diagnosis not present

## 2022-04-04 DIAGNOSIS — I129 Hypertensive chronic kidney disease with stage 1 through stage 4 chronic kidney disease, or unspecified chronic kidney disease: Secondary | ICD-10-CM | POA: Diagnosis not present

## 2022-04-07 DIAGNOSIS — N1831 Chronic kidney disease, stage 3a: Secondary | ICD-10-CM | POA: Diagnosis not present

## 2022-04-07 DIAGNOSIS — E782 Mixed hyperlipidemia: Secondary | ICD-10-CM | POA: Diagnosis not present

## 2022-04-07 DIAGNOSIS — I480 Paroxysmal atrial fibrillation: Secondary | ICD-10-CM | POA: Diagnosis not present

## 2022-04-07 DIAGNOSIS — D6869 Other thrombophilia: Secondary | ICD-10-CM | POA: Diagnosis not present

## 2022-04-07 DIAGNOSIS — N1832 Chronic kidney disease, stage 3b: Secondary | ICD-10-CM | POA: Diagnosis not present

## 2022-04-07 DIAGNOSIS — I10 Essential (primary) hypertension: Secondary | ICD-10-CM | POA: Diagnosis not present

## 2022-04-07 DIAGNOSIS — E559 Vitamin D deficiency, unspecified: Secondary | ICD-10-CM | POA: Diagnosis not present

## 2022-04-07 DIAGNOSIS — Z8673 Personal history of transient ischemic attack (TIA), and cerebral infarction without residual deficits: Secondary | ICD-10-CM | POA: Diagnosis not present

## 2022-05-03 NOTE — Progress Notes (Unsigned)
Cardiology Office Note:    Date:  05/04/2022   ID:  Darrell, Perkins 1950-03-22, MRN [id number]  PCP:  Josetta Huddle, [place] Cardiologist: Sanda Klein, MD   Reason for visit: Overdue follow-up  History of Present Illness:    Darrell Perkins is a 72 y.o. male with a hx of atrial flutter, OSA on CPAP, CKD, hypertension and history of CVA.  He briefly took anticoagulation in 2018 however this was stopped due to hematuria secondary to nephrolithiasis.  He had altered mental status in October 2019, MRI obtained on 12/17/2017 showed acute right posterior temporal and parietal white matter infarct.  Eliquis was added at that time.   His last visit was in March 2021.  Patient mentioned occasional palpitations.  Today, he states he cannot remember the last time he had palpitations.  He is taking Eliquis with no bleeding issues.  He states he got a new PCP that recommended he reconnect with cardiology.  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  He denies chest pain and shortness of breath.  No lightheadedness or syncope.  He states his blood pressure always tends to be a tad high.  He has been following with his PCP regularly.  He has changed his diet and states weight loss.  He has a green vegetable smoothie every morning.  He uses home weights.  He states he is stress-free after his divorce approximately 3 years ago.    Past Medical History:  Diagnosis Date   Bronchitis    hx of    CHF (congestive heart failure) (HCC)    EF 45%   Degenerative joint disease of knee 11/26/2014   GERD (gastroesophageal reflux disease)    Hypertensive cardiovascular disease    Kidney stones    Sleep apnea    USES CPAP   Typical atrial flutter ([place]) 10/2016    Past Surgical History:  Procedure Laterality Date   colonscopy      CYSTOSCOPY WITH RETROGRADE PYELOGRAM, URETEROSCOPY AND STENT PLACEMENT Right 08/01/2014   Procedure: CYSTOSCOPY WITH RIGHT  RETROGRADE  PYELOGRAM/RIGHT URETEROSCOPY [place] ;  Surgeon: Festus Aloe, MD;  Location: [facility];  Service: Urology;  Laterality: Right;   URETEROSCOPY WITH HOLMIUM LASER LITHOTRIPSY Right 11/18/2016   Procedure: URETEROSCOPY WITH HOLMIUM LASER LITHOTRIPSY/STENT PLACEMENT;  Surgeon: Festus Aloe, MD;  Location: [facility];  Service: Urology;  Laterality: Right;    Current Medications: Current Meds  Medication Sig   acetaminophen (TYLENOL) 650 MG CR tablet Take 650 mg by mouth 2 (two) times daily as needed for pain.   albuterol (PROVENTIL HFA;VENTOLIN HFA) 108 (90 BASE) MCG/ACT inhaler Inhale 1-2 puffs into the lungs every 6 (six) hours as needed for wheezing.   apixaban (ELIQUIS) 5 MG TABS tablet Take 1 tablet (5 mg total) by mouth every 12 (twelve) hours. Additional refills by PCP, Neurology, or Cardiology (Patient taking differently: Take 5 mg by mouth 2 (two) times daily.)   Bee Pollen 580 MG CAPS Take 1,160 mg by mouth daily.    budesonide-formoterol (SYMBICORT) 160-4.5 MCG/ACT inhaler Inhale 2 puffs into the lungs every other day.    Cholecalciferol (VITAMIN D) 2000 units tablet Take 2,000 Units by mouth daily.   donepezil (ARICEPT) 5 MG tablet    ezetimibe (ZETIA) 10 MG tablet Take 10 mg by mouth daily.   fluticasone (FLONASE) 50 MCG/ACT nasal spray Place 1 spray into both nostrils daily.   hydrOXYzine (  ATARAX/VISTARIL) 25 MG tablet Take 1 tablet (25 mg total) by mouth every 6 (six) hours.   losartan (COZAAR) 100 MG tablet Take 100 mg by mouth daily.     Allergies:   Allopurinol, Aspirin, Prednisone, Rosuvastatin, Simvastatin, and Tuberculin tests   Social History   Socioeconomic History   Marital status: Married    Spouse name: Not on file   Number of children: 3   Years of education: Not on file   Highest education level: High school graduate  Occupational History   Not on file  Tobacco Use   Smoking status: Never   Smokeless tobacco: Never   Vaping Use   Vaping Use: Never used  Substance and Sexual Activity   Alcohol use: No   Drug use: No   Sexual activity: Not on file  Other Topics Concern   Not on file  Social History Narrative   Pt is right handed   Pt lives in single story home with his wife and grandson   Has 3 children   12th grade education   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    Social Determinants of Health   Financial Resource Strain: Not on file  Food Insecurity: Not on file  Transportation Needs: Not on file  Physical Activity: Not on file  Stress: Not on file  Social Connections: Not on file     Family History: The patient's family history includes Alcohol abuse in his father; COPD in his father; Cerebrovascular Disease in his father; Diabetes in his father; Healthy in his child; Hypertension in his father and mother.  ROS:   Please see the history of present illness.     EKGs/Labs/Other Studies Reviewed:    EKG:  The ekg ordered today demonstrates has bradycardia with sinus arrhythmia, nonspecific T wave abnormality, heart rate 51.  Recent Labs: No results found for requested labs within last 365 days.   Recent Lipid Panel Lab Results  Component Value Date/Time   CHOL 181 12/18/2017 03:39 AM   TRIG 119 12/18/2017 03:39 AM   HDL 30 (L) 12/18/2017 03:39 AM   LDLCALC 127 (H) 12/18/2017 03:39 AM    Physical Exam:    VS:  BP (!) 172/100   Pulse (!) 51   Ht '6\' 3"'$  (1.905 m)   Wt 257 lb 9.6 oz (116.8 kg)   SpO2 98%   BMI 32.20 kg/m    No data found.  HYPERTENSION CONTROL Vitals:   05/04/22 0914 05/04/22 0945  BP: (!) 170/96 (!) 172/100    The patient's blood pressure is elevated above target today.  In order to address the patient's elevated BP: Blood pressure will be monitored at home to determine if medication changes need to be made.; Follow up with primary care provider for management.       Wt Readings from Last 3 Encounters:  05/04/22 257 lb 9.6 oz (116.8 kg)   01/01/19 250 lb 12.8 oz (113.8 kg)  05/15/18 252 lb 6.4 oz (114.5 kg)     GEN:  Well nourished, well developed in no acute distress HEENT: Normal NECK: No JVD; No carotid bruits CARDIAC: RRR, no murmurs, rubs, gallops RESPIRATORY:  Clear to auscultation without rales, wheezing or rhonchi  ABDOMEN: Soft, non-tender, non-distended MUSCULOSKELETAL: No edema SKIN: Warm and dry NEUROLOGIC:  Alert and oriented PSYCHIATRIC:  Normal affect     ASSESSMENT AND PLAN   Paroxysmal atrial flutter, no palpitations -Previously seen by Dr. Delrae Alfred -Continue Eliquis for secondary stroke prevention -  refilled -EKG today with sinus bradycardia with heart rate 51.  Not on AV nodal blocking agents.  Hypertension, BP elevated today -Patient states blood pressure is normally better.  He is currently on losartan 100 mg daily. -Recommend to check his blood pressure at least 3 times weekly and bring BP log to PCP in 2 to 3 weeks.  Recommend he purchase a Omron automatic blood pressure cuff.  Stated the importance of blood pressure control with his CKD.  Creatinine 2.0 in February 2024. -Would consider adding amlodipine if BP at home consistently over 130/80.   -PCP to notify us if they would like [place] to manage BP. -Goal BP is <130/80.  Recommend DASH diet (high in vegetables, fruits, low-fat dairy products, whole grains, poultry, fish, and nuts and low in sweets, sugar-sweetened beverages, and red meats), salt restriction and increase physical activity.  Hyperlipidemia -managed by PCP.  On Zetia 10 mg daily due to statin intolerance.  Disposition - Follow-up in 1 year to follow-up on atrial flutter.   Medication Adjustments/Labs and Tests Ordered: Current medicines are reviewed at length with the patient today.  Concerns regarding medicines are outlined above.  Orders Placed This Encounter  Procedures   EKG 12-Lead    Signed, Gaston Islam  05/04/2022 9:48 AM    [place]

## 2022-05-04 ENCOUNTER — Encounter: Payer: Self-pay | Admitting: Physician Assistant

## 2022-05-04 ENCOUNTER — Ambulatory Visit: Payer: Medicare Other | Attending: Physician Assistant | Admitting: Physician Assistant

## 2022-05-04 VITALS — BP 172/100 | HR 51 | Ht 75.0 in | Wt 257.6 lb

## 2022-05-04 DIAGNOSIS — I4892 Unspecified atrial flutter: Secondary | ICD-10-CM

## 2022-05-04 DIAGNOSIS — I10 Essential (primary) hypertension: Secondary | ICD-10-CM

## 2022-05-04 MED ORDER — APIXABAN 5 MG PO TABS: 5.0000 mg | ORAL_TABLET | Freq: Two times a day (BID) | ORAL | 0 refills | Status: DC

## 2022-05-04 MED ORDER — APIXABAN 5 MG PO TABS: 5.0000 mg | ORAL_TABLET | Freq: Two times a day (BID) | ORAL | 2 refills | Status: AC

## 2022-05-04 NOTE — Patient Instructions (Signed)
Medication Instructions:  No Changes *If you need a refill on your cardiac medications before your next appointment, please call your pharmacy*   Lab Work: No Labs If you have labs (blood work) drawn today and your tests are completely normal, you will receive your results only by: [place] (if you have MyChart) OR A paper copy in the mail If you have any lab test that is abnormal or we need to change your treatment, we will call you to review the results.   Testing/Procedures: No Testing   Follow-Up: At [facility], you and your health needs are our priority.  As part of our continuing mission to provide you with exceptional heart care, we have created designated Provider Care Teams.  These Care Teams include your primary Cardiologist (physician) and Advanced Practice Providers (APPs -  Physician Assistants and Nurse Practitioners) who all work together to provide you with the care you need, when you need it.  We recommend signing up for the patient portal called "MyChart".  Sign up information is provided on this After Visit Summary.  MyChart is used to connect with patients for Virtual Visits (Telemedicine).  Patients are able to view lab/test results, encounter notes, upcoming appointments, etc.  Non-urgent messages can be sent to your provider as well.   To learn more about what you can do with MyChart, go to [email or web address].    Your next appointment:   1 year(s)  Provider:   Sanda Klein, MD     Other Instruction Monitor blood Pressure 2-3 times weekly.

## 2022-05-04 NOTE — Addendum Note (Signed)
Addended by: Merri Ray A on: 05/04/2022 09:55 AM   Modules accepted: Orders

## 2022-05-04 NOTE — Addendum Note (Signed)
Addended by: Merri Ray A on: 05/04/2022 09:59 AM   Modules accepted: Orders

## 2022-06-21 DIAGNOSIS — G4733 Obstructive sleep apnea (adult) (pediatric): Secondary | ICD-10-CM | POA: Diagnosis not present

## 2022-08-12 DIAGNOSIS — H35373 Puckering of macula, bilateral: Secondary | ICD-10-CM | POA: Diagnosis not present

## 2022-08-19 DIAGNOSIS — E559 Vitamin D deficiency, unspecified: Secondary | ICD-10-CM | POA: Diagnosis not present

## 2022-08-19 DIAGNOSIS — I10 Essential (primary) hypertension: Secondary | ICD-10-CM | POA: Diagnosis not present

## 2022-08-19 DIAGNOSIS — Z23 Encounter for immunization: Secondary | ICD-10-CM | POA: Diagnosis not present

## 2022-08-19 DIAGNOSIS — M170 Bilateral primary osteoarthritis of knee: Secondary | ICD-10-CM | POA: Diagnosis not present

## 2022-08-19 DIAGNOSIS — G4733 Obstructive sleep apnea (adult) (pediatric): Secondary | ICD-10-CM | POA: Diagnosis not present

## 2022-08-19 DIAGNOSIS — Z0000 Encounter for general adult medical examination without abnormal findings: Secondary | ICD-10-CM | POA: Diagnosis not present

## 2022-08-19 DIAGNOSIS — Z86010 Personal history of colonic polyps: Secondary | ICD-10-CM | POA: Diagnosis not present

## 2022-08-19 DIAGNOSIS — I480 Paroxysmal atrial fibrillation: Secondary | ICD-10-CM | POA: Diagnosis not present

## 2022-08-19 DIAGNOSIS — Z8673 Personal history of transient ischemic attack (TIA), and cerebral infarction without residual deficits: Secondary | ICD-10-CM | POA: Diagnosis not present

## 2022-08-19 DIAGNOSIS — E782 Mixed hyperlipidemia: Secondary | ICD-10-CM | POA: Diagnosis not present

## 2022-08-19 DIAGNOSIS — N183 Chronic kidney disease, stage 3 unspecified: Secondary | ICD-10-CM | POA: Diagnosis not present

## 2022-09-29 ENCOUNTER — Telehealth: Payer: Self-pay | Admitting: *Deleted

## 2022-09-29 DIAGNOSIS — Z01818 Encounter for other preprocedural examination: Secondary | ICD-10-CM

## 2022-09-29 DIAGNOSIS — I4892 Unspecified atrial flutter: Secondary | ICD-10-CM

## 2022-09-29 NOTE — Telephone Encounter (Signed)
Pharmacy please advise on holding Eliquis prior to Colonoscopy scheduled for 10/25/2022. Thank you.

## 2022-09-29 NOTE — Telephone Encounter (Signed)
Pt has been scheduled for lab work CBC to be done tomorrow 09/30/22 at the [facility] office; pt has tele pre op appt 10/12/22 @ 9 am. Med rec and consent are done.

## 2022-09-29 NOTE — Telephone Encounter (Signed)
Pt has been scheduled for lab work CBC to be done tomorrow 09/30/22 at the [facility] office; pt has tele pre op appt 10/12/22 @ 9 am. Med rec and consent are done.    Patient Consent for Virtual Visit        [name] has provided verbal consent on 09/29/2022 for a virtual visit (video or telephone).   CONSENT FOR VIRTUAL VISIT FOR:  Darrell Perkins  By participating in this virtual visit I agree to the following:  I hereby voluntarily request, consent and authorize [facility] and its employed or contracted physicians, physician assistants, nurse practitioners or other licensed health care professionals (the Practitioner), to provide me with telemedicine health care services (the "Services") as deemed necessary by the treating Practitioner. I acknowledge and consent to receive the Services by the Practitioner via telemedicine. I understand that the telemedicine visit will involve communicating with the Practitioner through live audiovisual communication technology and the disclosure of certain medical information by electronic transmission. I acknowledge that I have been given the opportunity to request an in-person assessment or other available alternative prior to the telemedicine visit and am voluntarily participating in the telemedicine visit.  I understand that I have the right to withhold or withdraw my consent to the use of telemedicine in the course of my care at any time, without affecting my right to future care or treatment, and that the Practitioner or I may terminate the telemedicine visit at any time. I understand that I have the right to inspect all information obtained and/or recorded in the course of the telemedicine visit and may receive copies of available information for a reasonable fee.  I understand that some of the potential risks of receiving the Services via telemedicine include:  Delay or interruption in medical evaluation due to technological equipment failure or  disruption; Information transmitted may not be sufficient (e.g. poor resolution of images) to allow for appropriate medical decision making by the Practitioner; and/or  In rare instances, security protocols could fail, causing a breach of personal health information.  Furthermore, I acknowledge that it is my responsibility to provide information about my medical history, conditions and care that is complete and accurate to the best of my ability. I acknowledge that Practitioner's advice, recommendations, and/or decision may be based on factors not within their control, such as incomplete or inaccurate data provided by me or distortions of diagnostic images or specimens that may result from electronic transmissions. I understand that the practice of medicine is not an exact science and that Practitioner makes no warranties or guarantees regarding treatment outcomes. I acknowledge that a copy of this consent can be made available to me via my patient portal [facility] MyChart), or I can request a printed copy by calling the office of [facility].    I understand that my insurance will be billed for this visit.   I have read or had this consent read to me. I understand the contents of this consent, which adequately explains the benefits and risks of the Services being provided via telemedicine.  I have been provided ample opportunity to ask questions regarding this consent and the Services and have had my questions answered to my satisfaction. I give my informed consent for the services to be provided through the use of telemedicine in my medical care

## 2022-09-29 NOTE — Telephone Encounter (Addendum)
   Pre-operative Risk Assessment    Patient Name: Darrell Perkins  DOB: 01/16/1951 MRN: [id number]     Request for Surgical Clearance    Procedure:   COLONOSCOPY  Date of Surgery:  Clearance 10/25/22                                 Surgeon:  DR. Liliane Shi Surgeon's Group or Practice Name:  [facility] Phone number:  [phone] Fax number:  [phone]   Type of Clearance Requested:   - Medical  - Pharmacy:  Hold Apixaban (Eliquis)     Type of Anesthesia:   PROPOFOL   Additional requests/questions:    [name]   09/29/2022, 9:20 AM

## 2022-09-29 NOTE — Telephone Encounter (Signed)
Patient needs CBC ASAP.

## 2022-09-29 NOTE — Telephone Encounter (Signed)
No record of platelet count  in [facility], Care Everywhere.  Last in Epic 05/2018.  Please have drawn for clearance

## 2022-09-30 DIAGNOSIS — I4892 Unspecified atrial flutter: Secondary | ICD-10-CM | POA: Diagnosis not present

## 2022-09-30 DIAGNOSIS — Z01818 Encounter for other preprocedural examination: Secondary | ICD-10-CM | POA: Diagnosis not present

## 2022-10-03 NOTE — Telephone Encounter (Signed)
   Name: Darrell Perkins  DOB: [birth year]  MRN: [id number]  Primary Cardiologist: Thurmon Fair, MD   Preoperative team, please contact this patient and set up a phone call appointment for further preoperative risk assessment. Please obtain consent and complete medication review. Thank you for your help.  I confirm that guidance regarding antiplatelet and oral anticoagulation therapy has been completed and, if necessary, noted below.  Per office protocol, patient can hold Eliquis for 1-2 days prior to procedure.     Napoleon Form, Leodis Rains, NP 10/03/2022, 2:39 PM [facility]

## 2022-10-03 NOTE — Telephone Encounter (Signed)
Pharmacy please advise on holding Eliquis prior to colonoscopy scheduled for 10/25/2022. Thank you.

## 2022-10-03 NOTE — Telephone Encounter (Signed)
Patient with diagnosis of aflutter on Eliquis  Procedure: colonoscopy Date of procedure: 10/25/22   CHA2DS2-VASc Score = 5   This indicates a 7.2% annual risk of stroke. The patient's score is based upon: CHF History: 1 HTN History: 1 Diabetes History: 0 Stroke History: 2 Vascular Disease History: 0 Age Score: 1 Gender Score: 0      CrCl 40 ml/min  Per office protocol, patient can hold Eliquis for 1-2 days prior to procedure.    **This guidance is not considered finalized until pre-operative APP has relayed final recommendations.**

## 2022-10-04 DIAGNOSIS — G4733 Obstructive sleep apnea (adult) (pediatric): Secondary | ICD-10-CM | POA: Diagnosis not present

## 2022-10-09 NOTE — Progress Notes (Unsigned)
Virtual Visit via Telephone Note   Because of Darrell Perkins's co-morbid illnesses, he is at least at moderate risk for complications without adequate follow up.  This format is felt to be most appropriate for this patient at this time.  The patient did not have access to video technology/had technical difficulties with video requiring transitioning to audio format only (telephone).  All issues noted in this document were discussed and addressed.  No physical exam could be performed with this format.  Please refer to the patient's chart for his consent to telehealth for [facility].  Evaluation Performed:  Preoperative cardiovascular risk assessment _____________   Date:  10/12/2022   Patient ID:  Darrell Perkins, Darrell Perkins 05/30/1950, MRN [id number] Patient Location:  Home Provider location:   Office  Primary Care Provider:  [name], MD (Inactive) Primary Cardiologist:  Thurmon Fair, MD  Chief Complaint / Patient Profile   72 y.o. y/o male with a h/o atrial flutter, hypertension, CVA, OSA on CPAP, chronic kidney disease.  He is pending colonoscopy with Dr. Theodoro Clock, with [facility] on 10/25/2022.  He presents today for telephonic preoperative cardiovascular risk assessment.  History of Present Illness    Darrell Perkins is a 72 y.o. male who presents via audio/video conferencing for a telehealth visit today.  Pt was last seen in cardiology clinic on 05/04/2022 by Juanda Crumble, PA.  At that time Darrell Perkins was doing well blood pressure was well-controlled on losartan 100 mg daily, heart rate was bradycardic at 51 bpm not on AV nodal blocking agents.  He was to continue Eliquis as directed in the setting of prior CVA.  The patient is now pending procedure as outlined above. Since his last visit   Past Medical History    Past Medical History:  Diagnosis Date   Bronchitis    hx of    CHF (congestive heart failure) (HCC)    EF 45%   Degenerative joint disease of knee  11/26/2014   GERD (gastroesophageal reflux disease)    Hypertensive cardiovascular disease    Kidney stones    Sleep apnea    USES CPAP   Typical atrial flutter (HCC) 10/2016   Past Surgical History:  Procedure Laterality Date   colonscopy      CYSTOSCOPY WITH RETROGRADE PYELOGRAM, URETEROSCOPY AND STENT PLACEMENT Right 08/01/2014   Procedure: CYSTOSCOPY WITH RIGHT  RETROGRADE PYELOGRAM/RIGHT URETEROSCOPY HOLMIUMN LASER LITOTRIPSY  STENT PLACEMENT ;  Surgeon: Jerilee Field, MD;  Location: [facility];  Service: Urology;  Laterality: Right;   URETEROSCOPY WITH HOLMIUM LASER LITHOTRIPSY Right 11/18/2016   Procedure: URETEROSCOPY WITH HOLMIUM LASER LITHOTRIPSY/STENT PLACEMENT;  Surgeon: Jerilee Field, MD;  Location: [facility];  Service: Urology;  Laterality: Right;    Allergies  Allergies  Allergen Reactions   Allopurinol Other (See Comments)    "Upset stomach"   Aspirin Other (See Comments)    Upset stomach   Prednisone Other (See Comments)    "Irritable"   Rosuvastatin Other (See Comments)    Mental disturbance   Simvastatin Other (See Comments)    Tired and achy   Tuberculin Tests Other (See Comments)    Intense local reaction    Home Medications    Prior to Admission medications   Medication Sig Start Date End Date Taking? Authorizing Provider  acetaminophen (TYLENOL) 650 MG CR tablet Take 650 mg by mouth 2 (two) times daily as needed for pain.    [provider]  albuterol (PROVENTIL HFA;VENTOLIN  HFA) 108 (90 BASE) MCG/ACT inhaler Inhale 1-2 puffs into the lungs every 6 (six) hours as needed for wheezing. 04/14/12   Sunnie Nielsen, MD  apixaban (ELIQUIS) 5 MG TABS tablet Take 1 tablet (5 mg total) by mouth 2 (two) times daily. 05/04/22   Cannon Kettle, PA-C  Bee Pollen 580 MG CAPS Take 1,160 mg by mouth daily.     [provider]  budesonide-formoterol (SYMBICORT) 160-4.5 MCG/ACT inhaler Inhale 2 puffs into the lungs every other day.     [provider]  Cholecalciferol (VITAMIN D) 2000 units tablet Take 2,000 Units by mouth daily.    [provider]  donepezil (ARICEPT) 5 MG tablet  04/19/19   [provider]  ezetimibe (ZETIA) 10 MG tablet Take 10 mg by mouth daily. 11/25/17   [provider]  fluticasone (FLONASE) 50 MCG/ACT nasal spray Place 1 spray into both nostrils daily. 12/07/17   [provider]  hydrOXYzine (ATARAX/VISTARIL) 25 MG tablet Take 1 tablet (25 mg total) by mouth every 6 (six) hours. 12/19/17   Liberty Handy, PA-C  losartan (COZAAR) 100 MG tablet Take 100 mg by mouth daily. 09/18/16   [provider]    Physical Exam    Vital Signs:  TREYLAN MCCLINTOCK does not have vital signs available for review today.  Given telephonic nature of communication, physical exam is limited. AAOx3. NAD. Normal affect.  Speech and respirations are unlabored.  Accessory Clinical Findings    None  Assessment & Plan    1.  Preoperative Cardiovascular Risk Assessment:  According to the Revised Cardiac Risk Index (RCRI), his Perioperative Risk of Major Cardiac Event is (%): 0.4  His functional capacityFunctional Capacity in METs is: 9.89 according to the Duke Activity Status Index (DASI).   Per office protocol, patient can hold Eliquis for 1-2 days prior to procedure.  Please resume Eliquis as soon as possible postprocedure, at the discretion of the surgeon.   Therefore, based on ACC/AHA guidelines, patient would be at acceptable risk for the planned procedure without further cardiovascular testing. I will route this recommendation to the requesting party via Epic fax function.   The patient was advised that if he develops new symptoms prior to surgery to contact our office to arrange for a follow-up visit, and he verbalized understanding.   Time:   Today, I have spent 10 minutes with the patient with telehealth technology discussing medical history, symptoms, and management  plan.     Joni Reining, NP  10/12/2022, 10:58 AM

## 2022-10-12 ENCOUNTER — Ambulatory Visit: Payer: Medicare Other | Attending: Cardiology

## 2022-10-12 DIAGNOSIS — Z01810 Encounter for preprocedural cardiovascular examination: Secondary | ICD-10-CM | POA: Diagnosis not present

## 2023-02-17 DIAGNOSIS — M170 Bilateral primary osteoarthritis of knee: Secondary | ICD-10-CM | POA: Diagnosis not present

## 2023-02-17 DIAGNOSIS — Z23 Encounter for immunization: Secondary | ICD-10-CM | POA: Diagnosis not present

## 2023-02-17 DIAGNOSIS — E782 Mixed hyperlipidemia: Secondary | ICD-10-CM | POA: Diagnosis not present

## 2023-02-17 DIAGNOSIS — I10 Essential (primary) hypertension: Secondary | ICD-10-CM | POA: Diagnosis not present

## 2023-02-21 DIAGNOSIS — I10 Essential (primary) hypertension: Secondary | ICD-10-CM | POA: Diagnosis not present

## 2023-02-21 DIAGNOSIS — M25561 Pain in right knee: Secondary | ICD-10-CM | POA: Diagnosis not present

## 2023-03-13 DIAGNOSIS — N1832 Chronic kidney disease, stage 3b: Secondary | ICD-10-CM | POA: Diagnosis not present

## 2023-03-13 DIAGNOSIS — I4891 Unspecified atrial fibrillation: Secondary | ICD-10-CM | POA: Diagnosis not present

## 2023-03-13 DIAGNOSIS — I10 Essential (primary) hypertension: Secondary | ICD-10-CM | POA: Diagnosis not present

## 2023-03-13 DIAGNOSIS — E782 Mixed hyperlipidemia: Secondary | ICD-10-CM | POA: Diagnosis not present

## 2023-03-13 DIAGNOSIS — G4733 Obstructive sleep apnea (adult) (pediatric): Secondary | ICD-10-CM | POA: Diagnosis not present

## 2023-03-13 DIAGNOSIS — R739 Hyperglycemia, unspecified: Secondary | ICD-10-CM | POA: Diagnosis not present

## 2023-03-13 DIAGNOSIS — I480 Paroxysmal atrial fibrillation: Secondary | ICD-10-CM | POA: Diagnosis not present

## 2023-03-13 DIAGNOSIS — Z8673 Personal history of transient ischemic attack (TIA), and cerebral infarction without residual deficits: Secondary | ICD-10-CM | POA: Diagnosis not present

## 2023-03-16 DIAGNOSIS — M85862 Other specified disorders of bone density and structure, left lower leg: Secondary | ICD-10-CM | POA: Diagnosis not present

## 2023-03-16 DIAGNOSIS — M1712 Unilateral primary osteoarthritis, left knee: Secondary | ICD-10-CM | POA: Diagnosis not present

## 2023-03-16 DIAGNOSIS — M25562 Pain in left knee: Secondary | ICD-10-CM | POA: Diagnosis not present

## 2023-03-30 DIAGNOSIS — E782 Mixed hyperlipidemia: Secondary | ICD-10-CM | POA: Diagnosis not present

## 2023-03-30 DIAGNOSIS — I4891 Unspecified atrial fibrillation: Secondary | ICD-10-CM | POA: Diagnosis not present

## 2023-03-30 DIAGNOSIS — I10 Essential (primary) hypertension: Secondary | ICD-10-CM | POA: Diagnosis not present

## 2023-06-22 DIAGNOSIS — I10 Essential (primary) hypertension: Secondary | ICD-10-CM | POA: Diagnosis not present

## 2023-06-22 DIAGNOSIS — E782 Mixed hyperlipidemia: Secondary | ICD-10-CM | POA: Diagnosis not present

## 2023-06-22 DIAGNOSIS — E1122 Type 2 diabetes mellitus with diabetic chronic kidney disease: Secondary | ICD-10-CM | POA: Diagnosis not present

## 2023-08-02 ENCOUNTER — Ambulatory Visit: Attending: Cardiovascular Disease | Admitting: Cardiovascular Disease

## 2023-08-02 ENCOUNTER — Encounter: Payer: Self-pay | Admitting: Cardiovascular Disease

## 2023-08-02 VITALS — BP 126/78 | HR 54 | Ht 75.0 in | Wt 243.4 lb

## 2023-08-02 DIAGNOSIS — E669 Obesity, unspecified: Secondary | ICD-10-CM

## 2023-08-02 DIAGNOSIS — G4733 Obstructive sleep apnea (adult) (pediatric): Secondary | ICD-10-CM

## 2023-08-02 DIAGNOSIS — I428 Other cardiomyopathies: Secondary | ICD-10-CM | POA: Diagnosis not present

## 2023-08-02 DIAGNOSIS — I10 Essential (primary) hypertension: Secondary | ICD-10-CM | POA: Diagnosis not present

## 2023-08-02 DIAGNOSIS — E118 Type 2 diabetes mellitus with unspecified complications: Secondary | ICD-10-CM

## 2023-08-02 DIAGNOSIS — I483 Typical atrial flutter: Secondary | ICD-10-CM | POA: Diagnosis not present

## 2023-08-02 DIAGNOSIS — N1832 Chronic kidney disease, stage 3b: Secondary | ICD-10-CM

## 2023-08-02 NOTE — Patient Instructions (Signed)
 Medication Instructions:  Your physician recommends that you continue on your current medications as directed. Please refer to the Current Medication list given to you today.  *If you need a refill on your cardiac medications before your next appointment, please call your pharmacy*  Lab Work: NONE If you have labs (blood work) drawn today and your tests are completely normal, you will receive your results only by: MyChart Message (if you have MyChart) OR A paper copy in the mail If you have any lab test that is abnormal or we need to change your treatment, we will call you to review the results.  Testing/Procedures: NONE  Follow-Up: At [facility], you and your health needs are our priority.  As part of our continuing mission to provide you with exceptional heart care, our providers are all part of one team.  This team includes your primary Cardiologist (physician) and Advanced Practice Providers or APPs (Physician Assistants and Nurse Practitioners) who all work together to provide you with the care you need, when you need it.  Your next appointment:   1 year(s)  Provider:   One of our Advanced Practice Providers (APPs): Melita Springer, PA-C  Friddie Jetty, NP Evaline Hill, NP  Theotis Flake, PA-C Lawana Pray, NP  Willis Harter, PA-C Lovette Rud, PA-C  Harrington, PA-C Ernest Dick, NP  Marlana Silvan, NP Marcie Sever, PA-C  Laquita Plant, PA-C    Dayna Dunn, PA-C  Scott Weaver, PA-C Palmer Bobo, NP Katlyn West, NP Callie Goodrich, PA-C  Evan Williams, PA-C Sheng Haley, PA-C  Xika Zhao, NP Kathleen Johnson, PA-C   Then, Luana Rumple, MD will plan to see you again in 2 year(s). }   We recommend signing up for the patient portal called "MyChart".  Sign up information is provided on this After Visit Summary.  MyChart is used to connect with patients for Virtual Visits (Telemedicine).  Patients are able to view lab/test results, encounter notes, upcoming  appointments, etc.  Non-urgent messages can be sent to your provider as well.   To learn more about what you can do with MyChart, go to [email or web address].

## 2023-08-02 NOTE — Progress Notes (Signed)
 Cardiology Office Note:    Date:  08/02/2023   ID:  Sair, Faulcon 1951/02/18, MRN [id number]  PCP:  Berta Brittle, MD (Inactive)  Cardiologist:  Luana Rumple, MD   Referring MD: No ref. provider found   Chief Complaint  Patient presents with   Atrial Flutter    History of Present Illness:    Darrell Perkins is a 73 y.o. male with a hx of atrial flutter requiring brief hospitalization in August 2018, previous stroke in October 2019, chronic anticoagulation with Eliquis , asthma, obstructive sleep apnea, chronic kidney disease stage III, nephrolithiasis (source of hematuria in the past), essential hypertension.  He is taking donepezil for memory issues.  Left ventricular ejection fraction was mildly depressed in the past at 40-45%, presumably due to tachycardia cardiomyopathy, but his most recent assessment in October 2019 showed normal left ventricular systolic function with EF 55-60%.  He has not had clinical heart failure.  Reports compliance with CPAP and denies daytime hypersomnolence.  He has improved his diet by reducing the intake of red meat and increasing intake of greens.  He goes regularly to the gym and still works as a Lexicographer for a [employer].  The patient specifically denies any chest pain at rest or with exertion, dyspnea at rest or with exertion, orthopnea, paroxysmal nocturnal dyspnea, syncope, palpitations, focal neurological deficits, intermittent claudication, lower extremity edema, unexplained weight gain, cough, hemoptysis or wheezing.    Past Medical History:  Diagnosis Date   Bronchitis    hx of    CHF (congestive heart failure) (HCC)    EF 45%   Degenerative joint disease of knee 11/26/2014   GERD (gastroesophageal reflux disease)    Hypertensive cardiovascular disease    Kidney stones    Sleep apnea    USES CPAP   Typical atrial flutter (HCC) 10/2016    Past Surgical History:  Procedure Laterality Date   colonscopy      CYSTOSCOPY WITH  RETROGRADE PYELOGRAM, URETEROSCOPY AND STENT PLACEMENT Right 08/01/2014   Procedure: CYSTOSCOPY WITH RIGHT  RETROGRADE PYELOGRAM/RIGHT URETEROSCOPY HOLMIUMN LASER LITOTRIPSY  STENT PLACEMENT ;  Surgeon: Christina Coyer, MD;  Location: [facility];  Service: Urology;  Laterality: Right;   URETEROSCOPY WITH HOLMIUM LASER LITHOTRIPSY Right 11/18/2016   Procedure: URETEROSCOPY WITH HOLMIUM LASER LITHOTRIPSY/STENT PLACEMENT;  Surgeon: Christina Coyer, MD;  Location: [facility];  Service: Urology;  Laterality: Right;    Current Medications: Current Meds  Medication Sig   acetaminophen  (TYLENOL ) 650 MG CR tablet Take 650 mg by mouth 2 (two) times daily as needed for pain.   amLODipine (NORVASC) 5 MG tablet Take 5 mg by mouth daily.   apixaban  (ELIQUIS ) 5 MG TABS tablet Take 1 tablet (5 mg total) by mouth 2 (two) times daily.   Bee Pollen 580 MG CAPS Take 580 mg by mouth daily.   budesonide-formoterol  (SYMBICORT) 160-4.5 MCG/ACT inhaler Inhale 2 puffs into the lungs every other day.    donepezil (ARICEPT) 5 MG tablet    ezetimibe  (ZETIA ) 10 MG tablet Take 10 mg by mouth daily.   hydrochlorothiazide (HYDRODIURIL) 12.5 MG tablet Take 12.5 mg by mouth every morning.   hydrOXYzine  (ATARAX /VISTARIL ) 25 MG tablet Take 1 tablet (25 mg total) by mouth every 6 (six) hours.   losartan  (COZAAR ) 100 MG tablet Take 100 mg by mouth daily.   metFORMIN (GLUCOPHAGE) 500 MG tablet Take 500 mg by mouth 2 (two) times daily.   pravastatin (PRAVACHOL) 40 MG tablet Take 40 mg by  mouth daily.     Allergies:   Allopurinol, Aspirin, Prednisone , Rosuvastatin, Simvastatin, and Tuberculin tests     Family History: The patient's family history includes Alcohol abuse in his father; COPD in his father; Cerebrovascular Disease in his father; Diabetes in his father; Healthy in his child; Hypertension in his father and mother.  ROS:   Please see the history of present illness.     All other systems reviewed and are  negative.  EKGs/Labs/Other Studies Reviewed:    The following studies were reviewed today: EP consultation Dr. Nunzio Belch, Sept 2018  EKG:    EKG Interpretation Date/Time:  Wednesday Aug 02 2023 09:06:34 EDT Ventricular Rate:  54 PR Interval:  162 QRS Duration:  78 QT Interval:  432 QTC Calculation: 409 R Axis:   -7  Text Interpretation: Sinus bradycardia When compared with ECG of 10-May-2018 05:01, No significant change since last tracing Confirmed by Jessabelle Markiewicz ([id number]) on 08/02/2023 9:12:23 AM         Recent Labs: 09/30/2022: Hemoglobin 11.3; Platelets 242  Recent Lipid Panel    Component Value Date/Time   CHOL 181 12/18/2017 0339   TRIG 119 12/18/2017 0339   HDL 30 (L) 12/18/2017 0339   CHOLHDL 6.0 12/18/2017 0339   VLDL 24 12/18/2017 0339   LDLCALC 127 (H) 12/18/2017 0339   06/22/2023 Hemoglobin A1c 6.7% Cholesterol 158, HDL 42, LDL 87, triglycerides 167 Potassium 4.5, BUN 27 Estimated GFR 30  Physical Exam:    VS:  BP 126/78   Pulse (!) 54   Ht 6\' 3"  (1.905 m)   Wt 110.4 kg   SpO2 96%   BMI 30.42 kg/m     Wt Readings from Last 3 Encounters:  08/02/23 110.4 kg  05/04/22 116.8 kg  01/01/19 113.8 kg     GEN: He is very muscular, but also mildly obese; well nourished, well developed in no acute distress HEENT: Normal NECK: No JVD; No carotid bruits LYMPHATICS: No lymphadenopathy CARDIAC: RRR with occasional ectopy, no murmurs, rubs, gallops RESPIRATORY:  Clear to auscultation without rales, wheezing or rhonchi  ABDOMEN: Soft, non-tender, non-distended MUSCULOSKELETAL:  No edema; No deformity  SKIN: Warm and dry NEUROLOGIC:  Alert and oriented x 3 PSYCHIATRIC:  Normal affect   ASSESSMENT:    1. Typical atrial flutter (HCC)   2. Tachycardia induced cardiomyopathy (HCC)   3. OSA (obstructive sleep apnea)   4. Mild obesity   5. Essential hypertension   6. Type 2 diabetes mellitus with complication, without long-term current use of insulin (HCC)    7. Stage 3b chronic kidney disease (HCC)    PLAN:    In order of problems listed above:  AFlutter: Possibly related to OSA.  CHADSVasc 4-5 (CVA 2, age, HTN, history of decreased left ventricular systolic function). I have recommended that he undergo ablation if he has recurrent atrial flutter.   History of tachycardia cardiomyopathy: Did not have clinical heart failure and most recent echo showed complete recovery of LV systolic function OSA: Discussed the relationship with sleep apnea and atrial flutter.  Recommended 100% compliance with CPAP.  Also recommended weight loss.   Mild obesity: He has lost about 12 pounds in the last year and is now just borderline obese. HTN: mildly elevated blood pressure initially, but normal on recheck. No changes made to his medications.  I encouraged him to go to the gym and to perform aerobic activity and light weight lifting.  He should be focusing on relatively light weights (able to  perform 10-20 reps) rather than intense sudden strain with bench pressing/etc. DM2: Well-controlled on metformin in a low-dose. CKD3B: His BUN is at most borderline abnormal (most recently 27), but his creatinine is consistently elevated usually in the 1.7-2.0 range.  This would correspond to an estimated GFR of 35-45, but he is quite muscular for his age and I suspect that the creatinine overestimates the severity of kidney dysfunction.  Nevertheless need to carefully monitor kidney function.  Avoid NSAIDs and noncritical contrast based procedure.   Medication Adjustments/Labs and Tests Ordered: Current medicines are reviewed at length with the patient today.  Concerns regarding medicines are outlined above.  Orders Placed This Encounter  Procedures   EKG 12-Lead   No orders of the defined types were placed in this encounter.   Patient Instructions  Medication Instructions:  Your physician recommends that you continue on your current medications as directed. Please  refer to the Current Medication list given to you today.  *If you need a refill on your cardiac medications before your next appointment, please call your pharmacy*  Lab Work: NONE If you have labs (blood work) drawn today and your tests are completely normal, you will receive your results only by: MyChart Message (if you have MyChart) OR A paper copy in the mail If you have any lab test that is abnormal or we need to change your treatment, we will call you to review the results.  Testing/Procedures: NONE  Follow-Up: At [facility], you and your health needs are our priority.  As part of our continuing mission to provide you with exceptional heart care, our providers are all part of one team.  This team includes your primary Cardiologist (physician) and Advanced Practice Providers or APPs (Physician Assistants and Nurse Practitioners) who all work together to provide you with the care you need, when you need it.  Your next appointment:   1 year(s)  Provider:   One of our Advanced Practice Providers (APPs): Melita Springer, PA-C  Friddie Jetty, NP Evaline Hill, NP  Theotis Flake, PA-C Lawana Pray, NP  Willis Harter, PA-C Lovette Rud, PA-C  [place], PA-C Ernest Dick, NP  Marlana Silvan, NP Marcie Sever, PA-C  Laquita Plant, PA-C    Dayna Dunn, PA-C  Scott Weaver, PA-C Palmer Bobo, NP Katlyn West, NP Callie Goodrich, PA-C  Evan Williams, PA-C Sheng Haley, PA-C  Xika Zhao, NP Kathleen Johnson, PA-C   Then, Luana Rumple, MD will plan to see you again in 2 year(s). }   We recommend signing up for the patient portal called "MyChart".  Sign up information is provided on this After Visit Summary.  MyChart is used to connect with patients for Virtual Visits (Telemedicine).  Patients are able to view lab/test results, encounter notes, upcoming appointments, etc.  Non-urgent messages can be sent to your provider as well.   To learn more about what you can do with MyChart,  go to [email or web address].          Signed, Luana Rumple, MD  08/02/2023 9:31 AM    [facility]

## 2023-08-15 DIAGNOSIS — H2513 Age-related nuclear cataract, bilateral: Secondary | ICD-10-CM | POA: Diagnosis not present

## 2023-08-25 DIAGNOSIS — Z0000 Encounter for general adult medical examination without abnormal findings: Secondary | ICD-10-CM | POA: Diagnosis not present

## 2023-08-25 DIAGNOSIS — G4733 Obstructive sleep apnea (adult) (pediatric): Secondary | ICD-10-CM | POA: Diagnosis not present

## 2023-08-25 DIAGNOSIS — I480 Paroxysmal atrial fibrillation: Secondary | ICD-10-CM | POA: Diagnosis not present

## 2023-08-25 DIAGNOSIS — E782 Mixed hyperlipidemia: Secondary | ICD-10-CM | POA: Diagnosis not present

## 2023-08-25 DIAGNOSIS — E559 Vitamin D deficiency, unspecified: Secondary | ICD-10-CM | POA: Diagnosis not present

## 2023-08-25 DIAGNOSIS — E1122 Type 2 diabetes mellitus with diabetic chronic kidney disease: Secondary | ICD-10-CM | POA: Diagnosis not present

## 2023-08-25 DIAGNOSIS — D6869 Other thrombophilia: Secondary | ICD-10-CM | POA: Diagnosis not present

## 2023-08-25 DIAGNOSIS — N1832 Chronic kidney disease, stage 3b: Secondary | ICD-10-CM | POA: Diagnosis not present

## 2023-08-25 DIAGNOSIS — M170 Bilateral primary osteoarthritis of knee: Secondary | ICD-10-CM | POA: Diagnosis not present

## 2023-08-25 DIAGNOSIS — I10 Essential (primary) hypertension: Secondary | ICD-10-CM | POA: Diagnosis not present

## 2023-08-25 DIAGNOSIS — Z8673 Personal history of transient ischemic attack (TIA), and cerebral infarction without residual deficits: Secondary | ICD-10-CM | POA: Diagnosis not present

## 2023-08-29 DIAGNOSIS — I129 Hypertensive chronic kidney disease with stage 1 through stage 4 chronic kidney disease, or unspecified chronic kidney disease: Secondary | ICD-10-CM | POA: Diagnosis not present

## 2023-08-29 DIAGNOSIS — R3129 Other microscopic hematuria: Secondary | ICD-10-CM | POA: Diagnosis not present

## 2023-08-29 DIAGNOSIS — N200 Calculus of kidney: Secondary | ICD-10-CM | POA: Diagnosis not present

## 2023-08-29 DIAGNOSIS — E1122 Type 2 diabetes mellitus with diabetic chronic kidney disease: Secondary | ICD-10-CM | POA: Diagnosis not present

## 2023-08-29 DIAGNOSIS — N184 Chronic kidney disease, stage 4 (severe): Secondary | ICD-10-CM | POA: Diagnosis not present

## 2023-08-30 ENCOUNTER — Other Ambulatory Visit: Payer: Self-pay | Admitting: Nephrology

## 2023-08-30 DIAGNOSIS — N184 Chronic kidney disease, stage 4 (severe): Secondary | ICD-10-CM

## 2023-09-04 ENCOUNTER — Ambulatory Visit
Admission: RE | Admit: 2023-09-04 | Discharge: 2023-09-04 | Disposition: A | Discharge status: Home / Self Care | Source: Ambulatory Visit | Attending: Nephrology | Admitting: Nephrology

## 2023-09-04 DIAGNOSIS — I480 Paroxysmal atrial fibrillation: Secondary | ICD-10-CM | POA: Diagnosis not present

## 2023-09-04 DIAGNOSIS — N183 Chronic kidney disease, stage 3 unspecified: Secondary | ICD-10-CM | POA: Diagnosis not present

## 2023-09-04 DIAGNOSIS — M170 Bilateral primary osteoarthritis of knee: Secondary | ICD-10-CM | POA: Diagnosis not present

## 2023-09-04 DIAGNOSIS — I10 Essential (primary) hypertension: Secondary | ICD-10-CM | POA: Diagnosis not present

## 2023-09-04 DIAGNOSIS — N184 Chronic kidney disease, stage 4 (severe): Secondary | ICD-10-CM

## 2023-09-04 DIAGNOSIS — N189 Chronic kidney disease, unspecified: Secondary | ICD-10-CM | POA: Diagnosis not present

## 2023-09-07 ENCOUNTER — Telehealth: Payer: Self-pay | Admitting: Pharmacist

## 2023-09-07 NOTE — Progress Notes (Addendum)
   09/07/2023  Patient ID: Darrell Perkins, male   DOB: 1950/04/19, 73 y.o.   MRN: [id number]  Patient appeared on insurance report for at-risk for failing 2025 metric: Medication Adherence for Cholesterol (MAC)    Medication: Pravastatin 40mg  Last fill date: 03/30/23 90DS  Fail date: 09/06/23 (fail if refilled)  Will call pharmacy to have Pravastatin refilled on Monday. Needs refilled based on last lipid panel.  Update from 09/11/23: Called the pharmacy and said the pravastatin will be ready later today.    Aloysius Lewis, PharmD [facility]  Phone Number: [phone]

## 2023-09-27 ENCOUNTER — Telehealth: Payer: Self-pay | Admitting: *Deleted

## 2023-09-27 DIAGNOSIS — I4892 Unspecified atrial flutter: Secondary | ICD-10-CM | POA: Diagnosis not present

## 2023-09-27 DIAGNOSIS — Z86018 Personal history of other benign neoplasm: Secondary | ICD-10-CM | POA: Diagnosis not present

## 2023-09-27 DIAGNOSIS — G4733 Obstructive sleep apnea (adult) (pediatric): Secondary | ICD-10-CM | POA: Diagnosis not present

## 2023-09-27 NOTE — Telephone Encounter (Signed)
 Pharmacy please advise on holding Apixaban  for 2 days prior to COLONOSCOPY -H/O ADENOMATOUS POLYPS  scheduled for 10/26/2023. Thank you.

## 2023-09-27 NOTE — Telephone Encounter (Signed)
   Pre-operative Risk Assessment    Patient Name: Darrell Perkins  DOB: [birth year] MRN: [id number]   Date of last office visit: 08/02/23 DR. CROITORU Date of next office visit: NONE   Request for Surgical Clearance    Procedure:  COLONOSCOPY -H/O ADENOMATOUS POLYPS  Date of Surgery:  Clearance 10/26/23                                Surgeon:  DR. ELICIA Surgeon's Group or Practice Name:  [facility] Phone number:  [phone] Fax number:  [phone]   Type of Clearance Requested:   - Medical  - Pharmacy:  Hold Apixaban  (Eliquis ) x 2 DAYS PRIOR   Type of Anesthesia:  PROPOFOL    Additional requests/questions:    Bonney Niels Jest   09/27/2023, 11:40 AM

## 2023-09-29 NOTE — Telephone Encounter (Signed)
 Patient with diagnosis of atrial fibrillation on Eliquis  for anticoagulation.    Procedure:  COLONOSCOPY -H/O ADENOMATOUS POLYPS   Date of Surgery:  Clearance 10/26/23     CHA2DS2-VASc Score = 5   This indicates a 7.2% annual risk of stroke. The patient's score is based upon: CHF History: 1 HTN History: 1 Diabetes History: 0 Stroke History: 2 Vascular Disease History: 0 Age Score: 1 Gender Score: 0    CrCl no current BMET  (note that [facility] shows BMET 08/2023 but lists urine creatinine - check with [facility] to see if SCr was drawn but not visible in [facility] before drawing new BMET) Platelet count 242  Patient has not had an Afib/aflutter ablation within the last 3 months or DCCV within the last 30 days    **This guidance is not considered finalized until pre-operative APP has relayed final recommendations.**

## 2023-10-02 NOTE — Telephone Encounter (Signed)
 I left a message for Dr. Silvano  Miller's nurse/cma to call back as we are inquiring if a full BMET was drawn, before we call the pt to come in for lab work for [employer]. Left [phone] to call back.

## 2023-10-02 NOTE — Telephone Encounter (Signed)
 Pre-op team,   Patient will need BMP per pharmacy. Looks like he recently had a CBC but no BMP.   Thank you!  DW

## 2023-10-03 NOTE — Telephone Encounter (Signed)
 Labs received today from PCP:   CMP:   BUN 30 CREATININE 2.09 K+ 4.4 eGFR 32

## 2023-10-03 NOTE — Telephone Encounter (Signed)
 Arletta, with Dr. Dianne, PCP office that just returning her call. Left message inquiring to see if they drew a full panel for the BMET, and if not then we will need the pt to come in and get the lab done on our end. I asked for Arletta to call back to preop line [phone].

## 2023-10-04 NOTE — Telephone Encounter (Signed)
 See below for lab report

## 2023-10-04 NOTE — Telephone Encounter (Signed)
   Name: Darrell Perkins  DOB: 31-Mar-1950  MRN: [id number]  Primary Cardiologist: Jerel Balding, MD   Preoperative team, please contact this patient and set up a phone call appointment for further preoperative risk assessment. Please obtain consent and complete medication review. Thank you for your help.  I confirm that guidance regarding antiplatelet and oral anticoagulation therapy has been completed and, if necessary, noted below.  Per Pharmacy 10/04/2023 Patient can hold Eliquis  for 2 days prior to procedure. Recommend restarting as soon as possible due to history of CVA   I also confirmed the patient resides in the state of [place] . As per [facility] telemedicine laws, the patient must reside in the state in which the provider is licensed.   [name], NP 10/04/2023, 3:42 PM [facility]

## 2023-10-04 NOTE — Telephone Encounter (Signed)
 1st attempt to reach pt regarding surgical clearance and the need for an TELE appointment.  Left pt a detailed message to call back and get that scheduled.

## 2023-10-04 NOTE — Telephone Encounter (Signed)
 CrCl: 48 ml/min Plt: 242k  Patient can hold Eliquis  for 2 days prior to procedure. Recommend restarting as soon as possible due to history of CVA

## 2023-10-05 DIAGNOSIS — N184 Chronic kidney disease, stage 4 (severe): Secondary | ICD-10-CM | POA: Diagnosis not present

## 2023-10-05 DIAGNOSIS — M170 Bilateral primary osteoarthritis of knee: Secondary | ICD-10-CM | POA: Diagnosis not present

## 2023-10-05 DIAGNOSIS — I10 Essential (primary) hypertension: Secondary | ICD-10-CM | POA: Diagnosis not present

## 2023-10-05 DIAGNOSIS — N183 Chronic kidney disease, stage 3 unspecified: Secondary | ICD-10-CM | POA: Diagnosis not present

## 2023-10-05 DIAGNOSIS — I480 Paroxysmal atrial fibrillation: Secondary | ICD-10-CM | POA: Diagnosis not present

## 2023-10-05 NOTE — Telephone Encounter (Signed)
 2nd attempt to reach patient and left a message for patient to call back.

## 2023-10-06 NOTE — Telephone Encounter (Signed)
 3rd attempt: Left message for pt to call our office and ask for the preop team to schedule TELE Preop appt.  Will update surgeons office

## 2023-10-12 DIAGNOSIS — M25562 Pain in left knee: Secondary | ICD-10-CM | POA: Diagnosis not present

## 2023-10-16 ENCOUNTER — Telehealth: Payer: Self-pay

## 2023-10-16 NOTE — Telephone Encounter (Signed)
 Patient returned Pre-op call.

## 2023-10-16 NOTE — Telephone Encounter (Signed)
 Patient is scheduled for a tele visit on 10/20/23 with Damien Braver, NP

## 2023-10-16 NOTE — Telephone Encounter (Signed)
  Patient Consent for Virtual Visit        Darrell Perkins has provided verbal consent on 10/16/2023 for a virtual visit (video or telephone).   CONSENT FOR VIRTUAL VISIT FOR:  Darrell Perkins  By participating in this virtual visit I agree to the following:  I hereby voluntarily request, consent and authorize [facility] and its employed or contracted physicians, physician assistants, nurse practitioners or other licensed health care professionals (the Practitioner), to provide me with telemedicine health care services (the "Services) as deemed necessary by the treating Practitioner. I acknowledge and consent to receive the Services by the Practitioner via telemedicine. I understand that the telemedicine visit will involve communicating with the Practitioner through live audiovisual communication technology and the disclosure of certain medical information by electronic transmission. I acknowledge that I have been given the opportunity to request an in-person assessment or other available alternative prior to the telemedicine visit and am voluntarily participating in the telemedicine visit.  I understand that I have the right to withhold or withdraw my consent to the use of telemedicine in the course of my care at any time, without affecting my right to future care or treatment, and that the Practitioner or I may terminate the telemedicine visit at any time. I understand that I have the right to inspect all information obtained and/or recorded in the course of the telemedicine visit and may receive copies of available information for a reasonable fee.  I understand that some of the potential risks of receiving the Services via telemedicine include:  Delay or interruption in medical evaluation due to technological equipment failure or disruption; Information transmitted may not be sufficient (e.g. poor resolution of images) to allow for appropriate medical decision making by the Practitioner;  and/or  In rare instances, security protocols could fail, causing a breach of personal health information.  Furthermore, I acknowledge that it is my responsibility to provide information about my medical history, conditions and care that is complete and accurate to the best of my ability. I acknowledge that Practitioner's advice, recommendations, and/or decision may be based on factors not within their control, such as incomplete or inaccurate data provided by me or distortions of diagnostic images or specimens that may result from electronic transmissions. I understand that the practice of medicine is not an exact science and that Practitioner makes no warranties or guarantees regarding treatment outcomes. I acknowledge that a copy of this consent can be made available to me via my patient portal [facility] MyChart), or I can request a printed copy by calling the office of [facility].    I understand that my insurance will be billed for this visit.   I have read or had this consent read to me. I understand the contents of this consent, which adequately explains the benefits and risks of the Services being provided via telemedicine.  I have been provided ample opportunity to ask questions regarding this consent and the Services and have had my questions answered to my satisfaction. I give my informed consent for the services to be provided through the use of telemedicine in my medical care

## 2023-10-16 NOTE — Telephone Encounter (Signed)
 Patient has a tele visit with Damien Braver on 10/20/23 for pre-op clearance

## 2023-10-20 ENCOUNTER — Ambulatory Visit: Attending: Cardiovascular Disease | Admitting: Nurse Practitioner

## 2023-10-20 DIAGNOSIS — Z01810 Encounter for preprocedural cardiovascular examination: Secondary | ICD-10-CM | POA: Diagnosis not present

## 2023-10-20 NOTE — Progress Notes (Signed)
 Virtual Visit via Telephone Note   Because of Darrell Perkins co-morbid illnesses, he is at least at moderate risk for complications without adequate follow up.  This format is felt to be most appropriate for this patient at this time.  Due to technical limitations with video connection Web designer), today's appointment will be conducted as an audio only telehealth visit, and Darrell Perkins verbally agreed to proceed in this manner.   All issues noted in this document were discussed and addressed.  No physical exam could be performed with this format.  Evaluation Performed:  Preoperative cardiovascular risk assessment _____________   Date:  10/20/2023   Patient ID:  Darrell Perkins, Darrell Perkins August 14, 1950, MRN [id number] Patient Location:  Home Provider location:   Office  Primary Care Provider:  Delice Charleston, MD (Inactive) Primary Cardiologist:  Jerel Balding, MD  Chief Complaint / Patient Profile   73 y.o. y/o male with a h/o atrial flutter, tachycardia mediated cardiomyopathy, hypertension, CVA, type 2 diabetes, CKD stage IIIb, OSA, and obesity, who is pending colonoscopy on 10/26/2023 with Dr. Elicia of [facility] and presents today for telephonic preoperative cardiovascular risk assessment.  History of Present Illness    Darrell Perkins is a 73 y.o. male who presents via audio/video conferencing for a telehealth visit today.  Pt was last seen in cardiology clinic on 08/02/2023 by Dr. Balding. At that time Darrell Perkins was doing well.  The patient is now pending procedure as outlined above. Since his last visit, he has done well from a cardiac standpoint.   He denies chest pain, palpitations, dyspnea, pnd, orthopnea, n, v, dizziness, syncope, edema, weight gain, or early satiety. All other systems reviewed and are otherwise negative except as noted above.   Past Medical History    Past Medical History:  Diagnosis Date   Bronchitis    hx of    CHF (congestive heart failure) (HCC)    EF 45%    Degenerative joint disease of knee 11/26/2014   GERD (gastroesophageal reflux disease)    Hypertensive cardiovascular disease    Kidney stones    Sleep apnea    USES CPAP   Typical atrial flutter (HCC) 10/2016   Past Surgical History:  Procedure Laterality Date   colonscopy      CYSTOSCOPY WITH RETROGRADE PYELOGRAM, URETEROSCOPY AND STENT PLACEMENT Right 08/01/2014   Procedure: CYSTOSCOPY WITH RIGHT  RETROGRADE PYELOGRAM/RIGHT URETEROSCOPY HOLMIUMN LASER LITOTRIPSY  STENT PLACEMENT ;  Surgeon: Donnice Brooks, MD;  Location: [facility];  Service: Urology;  Laterality: Right;   URETEROSCOPY WITH HOLMIUM LASER LITHOTRIPSY Right 11/18/2016   Procedure: URETEROSCOPY WITH HOLMIUM LASER LITHOTRIPSY/STENT PLACEMENT;  Surgeon: Brooks Donnice, MD;  Location: [facility];  Service: Urology;  Laterality: Right;    Allergies  Allergies  Allergen Reactions   Allopurinol Other (See Comments)    Upset stomach   Aspirin Other (See Comments)    Upset stomach   Prednisone  Other (See Comments)    Irritable   Rosuvastatin Other (See Comments)    Mental disturbance   Simvastatin Other (See Comments)    Tired and achy   Tuberculin Tests Other (See Comments)    Intense local reaction    Home Medications    Prior to Admission medications   Medication Sig Start Date End Date Taking? Authorizing Provider  acetaminophen  (TYLENOL ) 650 MG CR tablet Take 650 mg by mouth 2 (two) times daily as needed for pain.    [provider]  albuterol  (PROVENTIL  HFA;VENTOLIN   HFA) 108 (90 BASE) MCG/ACT inhaler Inhale 1-2 puffs into the lungs every 6 (six) hours as needed for wheezing. Patient not taking: Reported on 10/16/2023 04/14/12   Sheran Rogue, MD  amLODipine (NORVASC) 5 MG tablet Take 5 mg by mouth daily. 05/30/23   [provider]  apixaban  (ELIQUIS ) 5 MG TABS tablet Take 1 tablet (5 mg total) by mouth 2 (two) times daily. 05/04/22   Lambert, Jennifer K, PA-C  atorvastatin (LIPITOR) 40 MG tablet  Take 40 mg by mouth daily. Patient not taking: Reported on 10/16/2023 03/15/23   [provider]  Bee Pollen 580 MG CAPS Take 580 mg by mouth daily.    [provider]  budesonide-formoterol  (SYMBICORT) 160-4.5 MCG/ACT inhaler Inhale 2 puffs into the lungs every other day.     [provider]  Cholecalciferol  (VITAMIN D) 2000 units tablet Take 2,000 Units by mouth daily.    [provider]  donepezil (ARICEPT) 5 MG tablet  04/19/19   [provider]  ezetimibe  (ZETIA ) 10 MG tablet Take 10 mg by mouth daily. 11/25/17   [provider]  fluticasone  (FLONASE ) 50 MCG/ACT nasal spray Place 1 spray into both nostrils daily. 12/07/17   [provider]  hydrochlorothiazide (HYDRODIURIL) 12.5 MG tablet Take 12.5 mg by mouth every morning. 07/22/23   [provider]  hydrOXYzine  (ATARAX /VISTARIL ) 25 MG tablet Take 1 tablet (25 mg total) by mouth every 6 (six) hours. 12/19/17   Norris Will PARAS, PA-C  losartan  (COZAAR ) 100 MG tablet Take 100 mg by mouth daily. 09/18/16   [provider]  metFORMIN (GLUCOPHAGE) 500 MG tablet Take 500 mg by mouth 2 (two) times daily. Patient not taking: Reported on 10/16/2023 05/31/23   [provider]  pravastatin (PRAVACHOL) 40 MG tablet Take 40 mg by mouth daily. 03/30/23   [provider]    Physical Exam    Vital Signs:  Darrell Perkins does not have vital signs available for review today.  Given telephonic nature of communication, physical exam is limited. AAOx3. NAD. Normal affect.  Speech and respirations are unlabored.  Accessory Clinical Findings    None  Assessment & Plan    1.  Preoperative Cardiovascular Risk Assessment:  According to the Revised Cardiac Risk Index (RCRI), his Perioperative Risk of Major Cardiac Event is (%): 6.6. His Functional Capacity in METs is: 5.72 according to the [employer] Activity Status Index (DASI).Therefore, based on ACC/AHA guidelines, patient  would be at acceptable risk for the planned procedure without further cardiovascular testing.  The patient was advised that if he develops new symptoms prior to surgery to contact our office to arrange for a follow-up visit, and he verbalized understanding.  Patient can hold Eliquis  for 2 days prior to procedure. Recommend restarting as soon as possible due to history of CVA.   A copy of this note will be routed to requesting surgeon.  Time:   Today, I have spent 5 minutes with the patient with telehealth technology discussing medical history, symptoms, and management plan.     Darrell Darrell Braver, NP  10/20/2023, 10:50 AM

## 2023-10-26 DIAGNOSIS — D122 Benign neoplasm of ascending colon: Secondary | ICD-10-CM | POA: Diagnosis not present

## 2023-10-26 DIAGNOSIS — K648 Other hemorrhoids: Secondary | ICD-10-CM | POA: Diagnosis not present

## 2023-10-26 DIAGNOSIS — Z09 Encounter for follow-up examination after completed treatment for conditions other than malignant neoplasm: Secondary | ICD-10-CM | POA: Diagnosis not present

## 2023-10-26 DIAGNOSIS — D123 Benign neoplasm of transverse colon: Secondary | ICD-10-CM | POA: Diagnosis not present

## 2023-10-26 DIAGNOSIS — D124 Benign neoplasm of descending colon: Secondary | ICD-10-CM | POA: Diagnosis not present

## 2023-10-26 DIAGNOSIS — D120 Benign neoplasm of cecum: Secondary | ICD-10-CM | POA: Diagnosis not present

## 2023-10-26 DIAGNOSIS — Z860101 Personal history of adenomatous and serrated colon polyps: Secondary | ICD-10-CM | POA: Diagnosis not present

## 2023-10-30 DIAGNOSIS — N184 Chronic kidney disease, stage 4 (severe): Secondary | ICD-10-CM | POA: Diagnosis not present

## 2023-10-30 DIAGNOSIS — E1122 Type 2 diabetes mellitus with diabetic chronic kidney disease: Secondary | ICD-10-CM | POA: Diagnosis not present

## 2023-10-30 DIAGNOSIS — I129 Hypertensive chronic kidney disease with stage 1 through stage 4 chronic kidney disease, or unspecified chronic kidney disease: Secondary | ICD-10-CM | POA: Diagnosis not present

## 2023-11-05 DIAGNOSIS — I480 Paroxysmal atrial fibrillation: Secondary | ICD-10-CM | POA: Diagnosis not present

## 2023-11-05 DIAGNOSIS — M170 Bilateral primary osteoarthritis of knee: Secondary | ICD-10-CM | POA: Diagnosis not present

## 2023-11-05 DIAGNOSIS — I10 Essential (primary) hypertension: Secondary | ICD-10-CM | POA: Diagnosis not present

## 2023-11-05 DIAGNOSIS — N183 Chronic kidney disease, stage 3 unspecified: Secondary | ICD-10-CM | POA: Diagnosis not present

## 2023-11-13 DIAGNOSIS — N184 Chronic kidney disease, stage 4 (severe): Secondary | ICD-10-CM | POA: Diagnosis not present

## 2023-11-13 DIAGNOSIS — I129 Hypertensive chronic kidney disease with stage 1 through stage 4 chronic kidney disease, or unspecified chronic kidney disease: Secondary | ICD-10-CM | POA: Diagnosis not present

## 2023-11-28 DIAGNOSIS — E1165 Type 2 diabetes mellitus with hyperglycemia: Secondary | ICD-10-CM | POA: Diagnosis not present

## 2023-11-28 DIAGNOSIS — Z23 Encounter for immunization: Secondary | ICD-10-CM | POA: Diagnosis not present

## 2023-11-28 DIAGNOSIS — E1122 Type 2 diabetes mellitus with diabetic chronic kidney disease: Secondary | ICD-10-CM | POA: Diagnosis not present

## 2023-11-28 DIAGNOSIS — I10 Essential (primary) hypertension: Secondary | ICD-10-CM | POA: Diagnosis not present

## 2023-12-05 DIAGNOSIS — I480 Paroxysmal atrial fibrillation: Secondary | ICD-10-CM | POA: Diagnosis not present

## 2023-12-05 DIAGNOSIS — N183 Chronic kidney disease, stage 3 unspecified: Secondary | ICD-10-CM | POA: Diagnosis not present

## 2023-12-05 DIAGNOSIS — I10 Essential (primary) hypertension: Secondary | ICD-10-CM | POA: Diagnosis not present

## 2023-12-05 DIAGNOSIS — M170 Bilateral primary osteoarthritis of knee: Secondary | ICD-10-CM | POA: Diagnosis not present
